# Patient Record
Sex: Male | Born: 1945
Health system: Southern US, Community
[De-identification: ages and names within clinical notes are randomized; demographics above are authoritative.]

## PROBLEM LIST (undated history)

## (undated) DIAGNOSIS — K219 Gastro-esophageal reflux disease without esophagitis: Secondary | ICD-10-CM

## (undated) DIAGNOSIS — F32A Depression, unspecified: Secondary | ICD-10-CM

## (undated) DIAGNOSIS — M8580 Other specified disorders of bone density and structure, unspecified site: Secondary | ICD-10-CM

## (undated) DIAGNOSIS — E785 Hyperlipidemia, unspecified: Secondary | ICD-10-CM

## (undated) DIAGNOSIS — F329 Major depressive disorder, single episode, unspecified: Secondary | ICD-10-CM

## (undated) HISTORY — DX: Other specified disorders of bone density and structure, unspecified site: M85.80

## (undated) HISTORY — PX: LUMBAR LAMINECTOMY: SHX95

## (undated) HISTORY — PX: ROTATOR CUFF REPAIR: SHX139

## (undated) HISTORY — PX: UMBILICAL HERNIA REPAIR: SHX196

## (undated) HISTORY — DX: Hyperlipidemia, unspecified: E78.5

---

## 1999-04-07 ENCOUNTER — Ambulatory Visit (HOSPITAL_COMMUNITY): Admission: RE | Admit: 1999-04-07 | Discharge: 1999-04-07 | Payer: Self-pay | Admitting: *Deleted

## 1999-04-07 ENCOUNTER — Encounter (INDEPENDENT_AMBULATORY_CARE_PROVIDER_SITE_OTHER): Payer: Self-pay | Admitting: Specialist

## 2004-01-12 ENCOUNTER — Ambulatory Visit (HOSPITAL_COMMUNITY): Admission: RE | Admit: 2004-01-12 | Discharge: 2004-01-12 | Payer: Self-pay | Admitting: *Deleted

## 2004-01-12 ENCOUNTER — Encounter (INDEPENDENT_AMBULATORY_CARE_PROVIDER_SITE_OTHER): Payer: Self-pay | Admitting: Specialist

## 2005-03-20 ENCOUNTER — Encounter: Admission: RE | Admit: 2005-03-20 | Discharge: 2005-03-20 | Payer: Self-pay

## 2005-03-26 ENCOUNTER — Observation Stay (HOSPITAL_COMMUNITY): Admission: RE | Admit: 2005-03-26 | Discharge: 2005-03-26 | Payer: Self-pay | Admitting: Neurosurgery

## 2007-03-18 ENCOUNTER — Encounter (INDEPENDENT_AMBULATORY_CARE_PROVIDER_SITE_OTHER): Payer: Self-pay | Admitting: *Deleted

## 2007-03-18 ENCOUNTER — Ambulatory Visit (HOSPITAL_COMMUNITY): Admission: RE | Admit: 2007-03-18 | Discharge: 2007-03-18 | Payer: Self-pay | Admitting: *Deleted

## 2007-08-14 HISTORY — PX: UMBILICAL HERNIA REPAIR: SHX196

## 2008-04-21 ENCOUNTER — Emergency Department (HOSPITAL_BASED_OUTPATIENT_CLINIC_OR_DEPARTMENT_OTHER): Admission: EM | Admit: 2008-04-21 | Discharge: 2008-04-22 | Payer: Self-pay | Admitting: Emergency Medicine

## 2008-05-27 ENCOUNTER — Encounter: Payer: Self-pay | Admitting: Emergency Medicine

## 2008-05-28 ENCOUNTER — Inpatient Hospital Stay (HOSPITAL_COMMUNITY): Admission: EM | Admit: 2008-05-28 | Discharge: 2008-06-04 | Payer: Self-pay | Admitting: Internal Medicine

## 2008-05-29 ENCOUNTER — Encounter (INDEPENDENT_AMBULATORY_CARE_PROVIDER_SITE_OTHER): Payer: Self-pay | Admitting: Surgery

## 2008-05-29 HISTORY — PX: SMALL INTESTINE SURGERY: SHX150

## 2008-05-29 HISTORY — PX: ABDOMINAL WALL MESH  REMOVAL: SHX1116

## 2008-06-05 ENCOUNTER — Inpatient Hospital Stay (HOSPITAL_COMMUNITY): Admission: EM | Admit: 2008-06-05 | Discharge: 2008-06-09 | Payer: Self-pay | Admitting: Emergency Medicine

## 2009-10-24 ENCOUNTER — Encounter: Admission: RE | Admit: 2009-10-24 | Discharge: 2009-10-24 | Payer: Self-pay | Admitting: Internal Medicine

## 2010-12-26 NOTE — Op Note (Signed)
NAMEAMORI, [name]             ACCOUNT NO.:  [id number]   MEDICAL RECORD NO.:  [id number]          PATIENT TYPE:  AMB   LOCATION:  ENDO                         FACILITY:  [facility]   PHYSICIAN:  Georgiana Spinner, M.D.    DATE OF BIRTH:  [birth year]   DATE OF PROCEDURE:  03/18/2007  DATE OF DISCHARGE:                               OPERATIVE REPORT   PROCEDURE:  Colonoscopy.   INDICATION:  Colon polyps.   ANESTHESIA:  Fentanyl 100 mcg, Versed 10 mg.   PROCEDURE:  With the patient mildly sedated in the left lateral  decubitus position, a rectal examination was performed which was  unremarkable to my exam.  Subsequently, the Pentax videoscopic  colonoscope was inserted in the rectum and passed under direct vision  with pressure applied to reach the cecum, identified by ileocecal valve  and appendiceal orifice, both of which were photographed.  In the cecum  was a small polyp which also was photographed and removed using hot  biopsy forceps technique setting of 20/150 blended current.  From this  point, the colonoscope was then slowly withdrawn taking circumferential  views of the colonic mucosa, stopping only in the rectum, which appeared  normal, and showed hemorrhoids on retroflexed view.  The endoscope was  straightened and withdrawn.  The patient's vital signs and pulse  oximetry remained stable.  The patient tolerated the procedure well  without apparent complications.   FINDINGS:  Cecal polyp and internal hemorrhoids.  Otherwise, an  unremarkable exam.   PLAN:  Await biopsy report.  The patient will call me for results and  follow up with me as an outpatient.           ______________________________  Georgiana Spinner, M.D.     GMO/MEDQ  D:  03/18/2007  T:  03/18/2007  Job:  [id number]

## 2010-12-26 NOTE — H&P (Signed)
NAMESAL, SPRATLEY             ACCOUNT NO.:  [id number]   MEDICAL RECORD NO.:  [id number]          PATIENT TYPE:  INP   LOCATION:  1538                         [facility]ACILITY:  [facility]   PHYSICIAN:  Anselm Pancoast. Weatherly, M.D.DATE OF BIRTH:  11-21-1945   DATE OF ADMISSION:  06/05/2008  DATE OF DISCHARGE:                              HISTORY & PHYSICAL   CHIEF COMPLAINT:  Nausea and vomiting.   HISTORY:  Norman Piacentini is a 65 year old Caucasian male.  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 and 3 weeks  later after being released to return to work was readmitted this time at  [employer] on Thursday p.m.  He became more distended and on Saturday a  few days later had an urgent exploratory laparotomy by Dr. Michaell Cowing and Dr.  Dwain Sarna with the finding of a obstruction, mesh kind of hearing to  the small bowel and a small bowel resection was needed.  Postoperatively think the patient had an NG tube which was removed.  I  think it was Tuesday which would have been about 4 days after surgery.  He had several small, dark bowel movements and was reported to have had  bowel sounds and then was discharged yesterday, that is Friday, which  was about 5 to 6 days after his surgery.  He went home thought that he  was hungry and had some requests, one was a peach milkshake,  particularly this morning, soft egg, et Karie Soda, but each time he was  nauseated and would throw up and his wife called me approximately 11  a.m. today.  I suggested that if he was really not able to tolerate  liquids then he was going to need to be seen in the emergency room and  possibly readmitted fearing that he was getting dehydrated.  He did come  to the emergency room and was seen by the ER physician and an acute  abdominal series was performed.  At this time it looks as if he has a  small-bowel obstruction and very dilated  stomach, multiple dilated loops  of small bowel and I really cannot see that there is any obvious gas in  the colon.  The patient states that he had a small bowel movement over  the last 24 hours and I then placed a nasogastric tube in the emergency  room and got 1500 mL of kind of very bilious NG drainage.  The patient  feeling somewhat improved.  The patient's laboratory studies are  somewhat concerning in that his white count is only 7100 with hematocrit  44 and his CMET BUN is 8, and glucose is 139.  Electrolytes are fine,  potassium was 5, but his bilirubin was 1.8 with an SGOT 120, SGPT 105.  The patient says he is really not having abdominal pain, he just feels  sick and feels nauseated.   EXAMINATION:  ABDOMEN:  He does have a distended abdomen.  His abdomen  is not that tender, and there is about a 4 inch  abdominal incisions at  the umbilicus that looked like the staples are kind of separated, but  there is no obvious wound infection.   I think from looking at the x-rays that he has probably got a persistent  on recurrent small-bowel obstruction and he needs NG suction and IV  fluids.  Repeat a CMET in the morning and also a flat and upright x-ray  of the abdomen in the morning.  Hopefully, the abnormal liver tests are  really related to his having a bowel obstruction and not that of a stone  in the bile duct et Karie Soda.  His gallbladder was well seen on the CT  done 1 week ago that showed no evidence of any stones.  No thickening of  the gallbladder, and no evidence of any dilated common bile duct.  The  patient's wife, of course, is upset that he is going to be readmitted.  I think there is no option, and we have a long discussion with her about  the plan, et Karie Soda.  The patient request a little bit of Ativan on a  p.r.n. basis and also we will use a  little bit of morphine if needed.  Pain is not an issue and hopefully  this is just basically a postoperative ileus that was  not resolved.  He  was fed too soon and will not need a repeat laparotomy.  I cannot see  any evidence of any free air and he certainly does not have any  peritoneal signs like an ongoing peritonitis.           ______________________________  Anselm Pancoast. Zachery Dakins, M.D.     WJW/MEDQ  D:  06/05/2008  T:  06/06/2008  Job:  [id number]

## 2010-12-26 NOTE — H&P (Signed)
[name], [name]             ACCOUNT NO.:  [id number]   MEDICAL RECORD NO.:  [id number]          PATIENT TYPE:  INP   LOCATION:  1510                         FACILITY:  [facility]   PHYSICIAN:  Eduard Clos, MDDATE OF BIRTH:  November 01, 1945   DATE OF ADMISSION:  05/28/2008  DATE OF DISCHARGE:                              HISTORY & PHYSICAL   CHIEF COMPLAINT:  Nausea, vomiting, abdominal pain.   HISTORY OF PRESENT ILLNESS:  A 65 year old male with a recent hernia  surgery three weeks ago presents to the ER with complaints of persistent  nausea, vomiting, and abdominal pain.  The patient has been having the  symptoms over the last 3-4 days.  He denies any blood in the vomitus.  The pain is more in the epigastric area above his recent surgical site.  The pain is a constant type of pain, dull, aching.  Patient has been  having continuous vomiting, almost 20 times a day.  He denies any  diarrhea.  The last time he moved his bowels was two days ago, which was  watery.  He denies any fevers or chills.  He denies any associated chest  pain, shortness of breath, weakness, loss of consciousness, headache, or  dysuria.   PAST MEDICAL HISTORY:  1. Depression.  2. Low back pain.   PAST SURGICAL HISTORY:  1. Low back surgery recently, three weeks ago.  2. Umbilical hernia surgery.   MEDICATIONS PRIOR TO ADMISSION:  Zoloft 50 mg daily.   ALLERGIES:  No known drug allergies.   FAMILY HISTORY:  Significant for patient's father having colon cancer.  Patient is having colonoscopies done regularly, the last one was 1-1/2  years ago, and has been advised about colon cancer screening.   SOCIAL HISTORY:  Denies smoking cigarettes, drinking alcohol, or using  illegal drugs.   REVIEW OF SYSTEMS:  As per the history of present illness, nothing else  significant.   PHYSICAL EXAMINATION:  Patient is examined at bedside.  Not in acute  distress.  VITAL SIGNS:  Blood pressure 110/70, pulse 70  per minute, temperature  98.3, respirations 18 per minute, O2 sat 94% on room air.  HEENT:  Anicteric.  No pallor.  CHEST:  Bilateral air entry present.  No rhonchi, no crepitation.  HEART:  S1 and S2 heard.  ABDOMEN:  Soft.  Nontender.  Bowel sounds are heard.  There is some  erythematous area around the periumbilical area.  No acute discharges.  CNS:  Awake, alert and oriented to time, place, and person.  Moves upper  extremities 5/5.  EXTREMITIES:  Peripheral pulses felt.  No edema.   LABS:  CBC:  WBC is 9.5, hemoglobin 16.1, hematocrit 45.6, platelets  283, neutrophils 58%. INR 1.  Complete metabolic panel:  Sodium 140,  potassium 3.8, chloride 99, carbon dioxide 28, glucose 106, BUN 16,  creatinine 1.1, total bilirubin 0.8, alkaline phosphatase 109, AST 30,  ALT 12, total protein 8.2, albumin 4.6, calcium 9.6, lipase 44.  CK-MB  3.3.  Troponin I less than 0.05.  Myoglobin 107.  UA is negative for  nitrites, leukocytes.  Has  ketones 15.  Glucose negative.  WBCs 0.   CT of the abdomen and pelvis shows partial small bowel obstruction at  the mid small bowel at the level of the ventral abdominal wall hernia,  likely related to a combination  and incarcerated .  No specific  evidence that  complicating ischemia.  Developing a small amount of  ascites, likely secondary to above-described small bowel obstruction.   ASSESSMENT:  1. Partial small bowel obstruction in the mid small bowel at the level      of the ventral abdominal wall hernia.  2. Dehydration.  3. Recent umbilical hernia surgery.  4. History of depression with no suicidal ideation.   PLAN:  Admit patient to medical floor.  Will place patient on IV fluids,  n.p.o.  Pain-relief medication.  Will consult surgery.  Place on empiric  antibiotics and further recommendation as patient's condition evolvES.      Eduard Clos, MD  Electronically Signed     ANK/MEDQ  D:  05/28/2008  T:  05/28/2008  Job:  [id number]

## 2010-12-26 NOTE — Op Note (Signed)
NAME:  [name], [name]             ACCOUNT NO.:  [id number]   MEDICAL RECORD NO.:  [id number]          PATIENT TYPE:  AMB   LOCATION:  ENDO                         FACILITY:  [facility]   PHYSICIAN:  George M. Orr, M.D.    DATE OF BIRTH:  08/22/1945   DATE OF PROCEDURE:  03/18/2007  DATE OF DISCHARGE:                               OPERATIVE REPORT   PROCEDURE:  Colonoscopy.   INDICATION:  Colon polyps.   ANESTHESIA:  Fentanyl 100 mcg, Versed 10 mg.   PROCEDURE:  With the patient mildly sedated in the left lateral  decubitus position, a rectal examination was performed which was  unremarkable to my exam.  Subsequently, the Pentax videoscopic  colonoscope was inserted in the rectum and passed under direct vision  with pressure applied to reach the cecum, identified by ileocecal valve  and appendiceal orifice, both of which were photographed.  In the cecum  was a small polyp which also was photographed and removed using hot  biopsy forceps technique setting of 20/150 blended current.  From this  point, the colonoscope was then slowly withdrawn taking circumferential  views of the colonic mucosa, stopping only in the rectum, which appeared  normal, and showed hemorrhoids on retroflexed view.  The endoscope was  straightened and withdrawn.  The patient's vital signs and pulse  oximetry remained stable.  The patient tolerated the procedure well  without apparent complications.   FINDINGS:  Cecal polyp and internal hemorrhoids.  Otherwise, an  unremarkable exam.   PLAN:  Await biopsy report.  The patient will call me for results and  follow up with me as an outpatient.           ______________________________  George M. Orr, M.D.     GMO/MEDQ  D:  03/18/2007  T:  03/18/2007  Job:  [id number] 

## 2010-12-26 NOTE — Op Note (Signed)
[name], [name]             ACCOUNT NO.:  [id number]   MEDICAL RECORD NO.:  [id number]          PATIENT TYPE:  INP   LOCATION:  1510                         [facility]ACILITY:  [facility]   PHYSICIAN:  Ardeth Sportsman, MD     DATE OF BIRTH:  [birth year]   DATE OF PROCEDURE:  DATE OF DISCHARGE:                               OPERATIVE REPORT   PRIMARY CARE PHYSICIAN:  Dr. Renne Crigler.   SURGICAL ASSISTANT:  Emelia Loron, MD.   PREOPERATIVE DIAGNOSIS:  Small-bowel obstruction status post resent  umbilical hernia repair.   POSTOPERATIVE DIAGNOSES:  1. Small bowel injury from stitch at umbilical hernia repair.  2. Resection secondary to small-bowel injury.   PROCEDURES:  1. Diagnostic laparoscopy.  2. Laparoscopic lysis of adhesions x30 minutes.  3. Exploratory laparotomy.  4. Small bowel resection (jejunum x10 cm).  5. Removal of infection mesh.  6. Primary closure with 1-PDS.   ANESTHESIA:  General anesthesia.   SPECIMENS:  Jejunum with inflamed segment.   DRAINS:  None.   ESTIMATED BLOOD LOSS:  Less than 50 mg.   COMPLICATIONS:  None.   INDICATION FOR PROCEDURE:  [name] is a pleasant, 65 year old  gentleman who was sent to me for periumbilical hernia.  I ended up doing  an open hernia repair with underlay repair using a dual-sided mesh.  Postoperatively he did rather well; however, he started having worsening  abdominal pain.  He came in bowel obstruction.  He was initially  admitted to the medical service and surgical consultation was  made.  I  was not immediately available, but [name] was able to see the  patient and help stabilize him.  However, given the fact he had  worsening pain and bowel obstruction within a month of his surgery,  concern  was prior abscess, exposed mesh or other problems, and  therefore recommendation was made for diagnostic laparoscopy, lysis of  adhesions and possible small bowel resection.  The risks, benefits and  alternatives were  discussed and it appeared [name] gave informed  consent and the patient agreed.   OPERATIVE FINDINGS:  There was a stitch that went through the mesh and  nicked a piece of small bowel.  There was no definite contamination of  succus, but there was inflammation around the area.  There were dense  adhesions around the area.  The transition point was into the loop of  jejunum that was particularly stuck to the region.   PROCEDURE IN DETAIL:  Informed consent was confirmed.  The patient  already had an NG tube in place.  He had sequential compression devices  during the entire case.  He was positioned supine and arms tucked.  His  abdomen was prepped and draped in sterile fashion.   [name] placed a 10-mm port in the left subcostal region using a  direct cutdown Hasson-type technique to good results.  Camera inspection  revealed no anterior abdominal injury but a dense inflammation of small  bowel loops and omentum to the periumbilical ventral hernia underlying  layer of mesh.  Under direct visualization  5-mm ports were placed  in  the left flank and left lower quadrant.  Later a final port was placed  superpubically in the right lower quadrant and right mid abdomen.   There was dense inflamed phlegmon, so we placed ports around  circumferentially to try and get the best angle possible.  Using cold  scissors sharply we were able to free off most loops of bowel.  However,  there was one loop of jejunum that was densely adherent to the mesh.  On  freeing off the area there was dense adherence to the rim of the mesh  itself.  In freeing off around the rim in the center we could see a blue  stitch that had nicked a piece of small bowel.  There was no definite  contamination, but there was inflammation around the region.   [name] and I agreed to convert to open for better visual  inspection.  Capnoperitonem was evacuated.  A periumbilical midline  incision was made.  We  dissected safely to get into the abdomen just  superior to the mesh.  The mesh was removed in its  entirety in somewhat  of a piecemeal fashion as well as all foreign body stitches.  The small  bowel was freed from its remaining attachments onto the abdominal wall  and brought into the wound.  Inspection revealed no definite hole  remaining, but there was an inflamed patch about 4 x 4 cm in size.  There was a sharp turn and it seemed consistent that this was the  location of the small-bowel obstruction.   With all the inflammation there was concern there may be a persistent  pinhole and then presence of inflammation, [name] & I agreed that  it was safe to leave this in place and therefore we performed a small  bowel resection using a GIA-75 stapler in a side-to-side fashion and a  TA-60 to close the staple defect.  The small short loop of jejunum and  mesentery was ligated using clamps and 2-0 Vicryl ties.  The mesenteric  defect was closed using 3-0 Vicryl figure-of-eight stitches with good  result.  Inspection revealed good viability and there was a nice opened  patent anastomosis.  We allowed the anastomosis to fall down to the left  lower quadrant away from the region.   Copious irrigation was done over a liter.  His omentum was rather stuck  in his left upper quadrant and I was not able to bring that down.  However, he did have a very fatty preperitoneal left median and  umbilical ligament that we were able to help bring as an underlayer  there.   The wound was closed using 1-loop PDS in a running fashion.  Meticulous  finger sweeps were made to make sure there was no bowl or any other  abnormalities caught.  The wound itself was irrigated with a liter of  saline to clear return.  The wound was loosely approximated with  staples, but left opening about every 2 cm and was packed with Kling  gauze.  Betadine was placed around the injury.   Diagnostic laparoscopy was  performed again.  The left upper quadrant 10-  mm  port site was reapproximated using a laparoscopic suture passer  under direct visualization to good result.  The laparoscopic port sites  were used and closed using staples.  Sterile dressing was applied.   The patient was extubated and sent to recovery room in stable condition.   I am about  to explain the operative findings to the patient's family and  will discuss at length.  [name] and I will continue to follow the  patient.      Ardeth Sportsman, MD  Electronically Signed     SCG/MEDQ  D:  05/29/2008  T:  05/29/2008  Job:  [id number]   cc:   Juanetta Gosling, MD  [address]  [place] [address]   Soyla Murphy. Renne Crigler, M.D.  Fax: [phone]

## 2010-12-29 NOTE — Op Note (Signed)
[name], [name]             ACCOUNT NO.:  [id number]   MEDICAL RECORD NO.:  [id number]          PATIENT TYPE:  AMB   LOCATION:  SDS                          FACILITY:  [facility]   PHYSICIAN:  Henry A. Pool, M.D.    DATE OF BIRTH:  [birth year]   DATE OF PROCEDURE:  03/26/2005  DATE OF DISCHARGE:                                 OPERATIVE REPORT   PREOPERATIVE DIAGNOSES:  Right L3-4 herniated nucleus pulposus with  radiculopathy.   POSTOPERATIVE DIAGNOSES:  Right L3-4 herniated nucleus pulposus with  radiculopathy.   OPERATION PERFORMED:  Right L3-4 laminotomy and microdiskectomy.   SURGEON:  Kathaleen Maser. Pool, M.D.   ASSISTANT:  Reinaldo Meeker, M.D.   ANESTHESIA:  General oral endotracheal.   INDICATIONS FOR PROCEDURE:  The patient is a 65 year old male with a history  of severe back and right lower extremity pain, consistent with a right-sided  L4 radiculopathy which has failed conservative management.  Work-up  demonstrates evidence of a large right-sided L3-4 disk herniation with  marked compression of the thecal sac and right-sided L4 nerve root.  The  patient has been counseled as to his options.  He decided to proceed with a  laminotomy and microdiskectomy in hopes of improving the symptoms.   DESCRIPTION OF PROCEDURE:  The patient was taken to the operating room and  placed on the table in the supine position.  After adequate level of  anesthesia was achieved, the patient was positioned prone onto a Wilson  frame and appropriately padded.  The patient's lumbar region was prepped and  draped sterilely.  A 10 blade was used to make a linear skin incision  overlying the L3-4 interspace.  This was carried down sharply in the  midline.  A subperiosteal dissection was then performed exposing the lamina  and facet joints of L3 and L4 on the right side.  Deep self-retaining  retractor was placed.  Intraoperative x-ray was taken, the level was  confirmed.  The laminotomy was  then performed using high speed drill and  Kerrison rongeurs to remove the inferior edge of the lamina at L3, medial  aspect of the L3-4 facet joint and the superior rim of the L4 lamina.  Ligamentum flavum was then elevated and resected in piecemeal fashion using  Kerrison rongeurs.  The underlying thecal sac and exiting L4 nerve root were  identified.   The microscope was brought into the field and used for microdissection of  the left-sided L4 nerve root and underlying disk herniation.  Epidural  venous plexus coagulated and cut.  Thecal sac and L4 nerve root were gently  mobilized medially.  A large amount of free disk herniation was encountered  and removed using pituitary rongeurs.  Three very large free fragments of  disk herniation were completely resected.  The thecal sac and nerve roots  were then mobilized and retracted towards the midline.  Disk space was then  isolated and then incised with a 15 blade in a rectangular fashion.  A wide  disk space cleanout was then achieved using pituitary rongeurs, upward  angled pituitary rongeurs and  Epstein curets.  All loose or obviously  degenerated disk material was removed from the interspace.  All  loose or  obviously degenerated disk material was removed from the interspace.  All  elements of disk herniation were completely resected.  At this point a very  thorough diskectomy had been performed.  There was no evidence of injury to  thecal sac or nerve roots.  There was no evidence of any residual  compression of the thecal sac or nerve roots.  The wound was then irrigated  with antibiotic solution.  Gelfoam was placed topically for hemostasis which  was found to be good.  Microscope and retractor system were removed.  Hemostasis in the muscle was achieved with electrocautery.  The wound was  then closed in layers with Vicryl sutures.  Steri-Strips and sterile  dressing were applied.  There were no apparent complications.  The  patient  tolerated the procedure well and returned to the recovery room  postoperatively.       HAP/MEDQ  D:  03/26/2005  T:  03/26/2005  Job:  [id number]

## 2010-12-29 NOTE — Discharge Summary (Signed)
[name], [name]             ACCOUNT NO.:  [id number]   MEDICAL RECORD NO.:  [id number]          PATIENT TYPE:  INP   LOCATION:  1538                         FACILITY:  [facility]   PHYSICIAN:  Ardeth Sportsman, MD     DATE OF BIRTH:  October 27, 1945   DATE OF ADMISSION:  06/05/2008  DATE OF DISCHARGE:  06/09/2008                               DISCHARGE SUMMARY   PRIMARY CARE PHYSICIAN:  Soyla Murphy. Renne Crigler, M.D.   SURGEON:  Ardeth Sportsman, MD.   FINAL DIAGNOSIS:  Partial small-bowel obstruction.   PROCEDURE:  1. Partial small bowel obstruction.  2. Umbilical hernia repair in early October of 2009.  3. History of depression.  4. Low back pain.  5. Low back surgery.   SUMMARY OF HOSPITAL COURSE:  [name] is a 65 year old obese  gentleman who had an umbilical hernia repair earlier in the month.  It  was complicated by bowel obstruction that required removal of the mesh  and small bowel resection with reanastomosis.  Postoperative his  obstruction gradually opened up and he was discharged home.  However, he  bounced back within 48 hours with some returning nausea and vomiting.  He was admitted by Anselm Pancoast. Zachery Dakins, M.D.  He had an NG tube placed  and IV fluids given aggressively.  His NG tube output decreased.  He  eventually had flatus.  His NG tube was clamped for a day and he still  had flatus and bowel movements and did well.  His NG tube was removed.  He was started on liquids and advanced to a solid diet over the next 48  hours.  By the time of discharge he was walking the hallways, having  flatus and bowel movements, his abdomen was no longer distended and  soft.  He was tolerating a solid and liquid diet relatively well.  His  incision had nearly closed down to just a few granulation areas since it  was partially opened secondary to the emergent surgery.   The patient has improved and he will be discharged home with the follow  instructions:  1. He is return to clinic to  see me next week for removal of his      staples and make sure he is doing well.  2. He should have a low-residue, low-fat diet as tolerated.  I      stressed that he should transition to liquids and bland foods if he      feels returning nausea, vomiting or other concerns.  Note that he      and his wife, that if he has any concerning abdominal pain, nausea,      vomiting or other she is to call us.  3. He should call if has any fevers, chills, sweats, worsening      drainage from incisions or other concerns.   He can return to his home medications that include:  1. Zoloft 50 mg p.o. daily.  2. I also wrote for some Tylenol p.r.n.  3. Ibuprofen p.r.n.  4. Heating pads p.r.n., ice packs p.r.n.  5. Narcotic 5-10 mg p.o.  q.4h. p.r.n. pain.      Ardeth Sportsman, MD  Electronically Signed     SCG/MEDQ  D:  06/10/2008  T:  06/10/2008  Job:  [id number]

## 2010-12-29 NOTE — Op Note (Signed)
NAME:  [name], [name]                       ACCOUNT NO.:  [id number]   MEDICAL RECORD NO.:  [id number]                   PATIENT TYPE:  AMB   LOCATION:  ENDO                                 FACILITY:  [facility]   PHYSICIAN:  Georgiana Spinner, M.D.                 DATE OF BIRTH:  May 21, 1946   DATE OF PROCEDURE:  01/12/2004  DATE OF DISCHARGE:                                 OPERATIVE REPORT   PROCEDURE:  Colonoscopy with biopsy.   INDICATIONS:  Colon polyps.   ANESTHESIA:  1. Demerol 80.  2. Versed 8 mg.   PROCEDURE:  With the patient mildly sedated in the left lateral decubitus  position, a rectal examination was performed which revealed external  hemorrhoids, otherwise unremarkable.  Subsequently, the Olympus videoscopic  colonoscope was inserted in the rectum and passed under direct vision to the  cecum, identified by the ileocecal valve and appendiceal orifice, both of  which were photographed.  From this point, the colonoscope was slowly  withdrawn, taking circumferential views of the colonic mucosa, stopping in  the ascending colon near the hepatic flexure, at which point a small polyp  was seen, photographed and removed using snare cautery technique, setting of  20 and 20 blended current.  Tissue was retrieved for pathology.  The  endoscope was then withdrawn all the way to the rectum, taking  circumferential views of remaining colonic mucosa, stopping in the rectum.  The endoscope was then placed in retroflex view to view the anal canal from  above.  Internal hemorrhoids were seen and photographed.  The endoscope was  straightened and withdrawn.  The patient's vital signs and pulse oximeter  remained stable.  Patient tolerated procedure well without apparent  complications.   FINDINGS:  1. Internal hemorrhoids.  2. Polyp of ascending colon near the hepatic flexure.   PLAN:  1. Await biopsy report.  2. Patient will call me for results and followup with me as an  outpatient.                                               Georgiana Spinner, M.D.    GMO/MEDQ  D:  01/12/2004  T:  01/12/2004  Job:  [id number]

## 2011-02-21 ENCOUNTER — Other Ambulatory Visit: Payer: Self-pay | Admitting: Dermatology

## 2011-02-21 DIAGNOSIS — C4491 Basal cell carcinoma of skin, unspecified: Secondary | ICD-10-CM

## 2011-02-21 HISTORY — DX: Basal cell carcinoma of skin, unspecified: C44.91

## 2011-03-20 ENCOUNTER — Other Ambulatory Visit: Payer: Self-pay | Admitting: Gastroenterology

## 2011-03-26 ENCOUNTER — Other Ambulatory Visit: Payer: Self-pay | Admitting: Dermatology

## 2011-05-15 LAB — POCT CARDIAC MARKERS
CKMB, poc: 3.3
CKMB, poc: 6
Myoglobin, poc: 107
Myoglobin, poc: 149
Troponin i, poc: <0.05
Troponin i, poc: <0.05

## 2011-05-15 LAB — DIFFERENTIAL
Basophils Absolute: 0.2 — ABNORMAL HIGH
Basophils Absolute: 0.1
Basophils Relative: 2 — ABNORMAL HIGH
Basophils Relative: 1
Eosinophils Absolute: 0.5
Eosinophils Absolute: 0.4
Eosinophils Relative: 5
Eosinophils Relative: 6 — ABNORMAL HIGH
Lymphocytes Relative: 28
Lymphocytes Relative: 27
Lymphs Abs: 2.6
Lymphs Abs: 1.9
Monocytes Absolute: 0.8
Monocytes Absolute: 0.7
Monocytes Relative: 8
Monocytes Relative: 9
Neutro Abs: 5.4
Neutro Abs: 4.1
Neutrophils Relative %: 58
Neutrophils Relative %: 57

## 2011-05-15 LAB — COMPREHENSIVE METABOLIC PANEL
ALT: 12
ALT: 105 — ABNORMAL HIGH
ALT: 18
ALT: 88 — ABNORMAL HIGH
AST: 30
AST: 120 — ABNORMAL HIGH
AST: 25
AST: 91 — ABNORMAL HIGH
Albumin: 4.6
Albumin: 2.9 — ABNORMAL LOW
Albumin: 3.5
Albumin: 3.6
Alkaline Phosphatase: 109
Alkaline Phosphatase: 102
Alkaline Phosphatase: 134 — ABNORMAL HIGH
Alkaline Phosphatase: 161 — ABNORMAL HIGH
BUN: 16
BUN: 11
BUN: 8
BUN: 8
CO2: 28
CO2: 24
CO2: 27
CO2: 29
Calcium: 9.6
Calcium: 8.7
Calcium: 8.8
Calcium: 9.6
Chloride: 99
Chloride: 105
Chloride: 107
Chloride: 98
Creatinine, Ser: 1.1
Creatinine, Ser: 1.05
Creatinine, Ser: 1.09
Creatinine, Ser: 1.14
GFR calc Af Amer: >60
GFR calc Af Amer: >60
GFR calc Af Amer: >60
GFR calc Af Amer: >60
GFR calc non Af Amer: >60
GFR calc non Af Amer: >60
GFR calc non Af Amer: >60
GFR calc non Af Amer: >60
Glucose, Bld: 106 — ABNORMAL HIGH
Glucose, Bld: 113 — ABNORMAL HIGH
Glucose, Bld: 118 — ABNORMAL HIGH
Glucose, Bld: 128 — ABNORMAL HIGH
Potassium: 3.8
Potassium: 3.3 — ABNORMAL LOW
Potassium: 3.7
Potassium: 5
Sodium: 140
Sodium: 133 — ABNORMAL LOW
Sodium: 139
Sodium: 141
Total Bilirubin: 0.8
Total Bilirubin: 0.7
Total Bilirubin: 0.8
Total Bilirubin: 1.8 — ABNORMAL HIGH
Total Protein: 8.2
Total Protein: 5.7 — ABNORMAL LOW
Total Protein: 6.8
Total Protein: 7.1

## 2011-05-15 LAB — GLUCOSE, CAPILLARY
Glucose-Capillary: 102 — ABNORMAL HIGH
Glucose-Capillary: 108 — ABNORMAL HIGH
Glucose-Capillary: 109 — ABNORMAL HIGH
Glucose-Capillary: 109 — ABNORMAL HIGH
Glucose-Capillary: 111 — ABNORMAL HIGH
Glucose-Capillary: 112 — ABNORMAL HIGH
Glucose-Capillary: 115 — ABNORMAL HIGH
Glucose-Capillary: 117 — ABNORMAL HIGH
Glucose-Capillary: 70
Glucose-Capillary: 70
Glucose-Capillary: 73
Glucose-Capillary: 78
Glucose-Capillary: 83
Glucose-Capillary: 92
Glucose-Capillary: 95
Glucose-Capillary: 97

## 2011-05-15 LAB — URINALYSIS, ROUTINE W REFLEX MICROSCOPIC
Glucose, UA: NEGATIVE
Glucose, UA: NEGATIVE
Hgb urine dipstick: NEGATIVE
Ketones, ur: 15 — AB
Ketones, ur: 15 — AB
Leukocytes, UA: NEGATIVE
Nitrite: NEGATIVE
Nitrite: NEGATIVE
Protein, ur: 30 — AB
Protein, ur: 100 — AB
Specific Gravity, Urine: 1.031 — ABNORMAL HIGH
Specific Gravity, Urine: 1.038 — ABNORMAL HIGH
Urobilinogen, UA: 1
Urobilinogen, UA: 1
pH: 6
pH: 6

## 2011-05-15 LAB — PROTIME-INR
INR: 1
INR: 1
Prothrombin Time: 13.3
Prothrombin Time: 13.3

## 2011-05-15 LAB — URINE MICROSCOPIC-ADD ON

## 2011-05-15 LAB — CBC
HCT: 45.6
HCT: 34.8 — ABNORMAL LOW
HCT: 35.6 — ABNORMAL LOW
HCT: 36.6 — ABNORMAL LOW
HCT: 39.7
HCT: 40.1
HCT: 44
HCT: 44.8
Hemoglobin: 16.1
Hemoglobin: 11.8 — ABNORMAL LOW
Hemoglobin: 12.2 — ABNORMAL LOW
Hemoglobin: 12.7 — ABNORMAL LOW
Hemoglobin: 13.7
Hemoglobin: 13.9
Hemoglobin: 14.9
Hemoglobin: 15.1
MCHC: 35.3
MCHC: 33.8
MCHC: 33.9
MCHC: 33.9
MCHC: 34.3
MCHC: 34.6
MCHC: 34.6
MCHC: 34.9
MCV: 93.8
MCV: 95.7
MCV: 96.2
MCV: 96.3
MCV: 96.6
MCV: 96.6
MCV: 97.2
MCV: 97.6
Platelets: 283
Platelets: 207
Platelets: 226
Platelets: 261
Platelets: 262
Platelets: 289
Platelets: 331
Platelets: 360
RBC: 4.86
RBC: 3.57 — ABNORMAL LOW
RBC: 3.7 — ABNORMAL LOW
RBC: 3.82 — ABNORMAL LOW
RBC: 4.11 — ABNORMAL LOW
RBC: 4.13 — ABNORMAL LOW
RBC: 4.58
RBC: 4.64
RDW: 11.9
RDW: 12.1
RDW: 12.5
RDW: 12.6
RDW: 12.7
RDW: 12.9
RDW: 13
RDW: 13
WBC: 9.5
WBC: 5
WBC: 5.3
WBC: 5.9
WBC: 6.2
WBC: 7.1
WBC: 7.2
WBC: 8.2

## 2011-05-15 LAB — HEPATIC FUNCTION PANEL
ALT: 79 — ABNORMAL HIGH
AST: 54 — ABNORMAL HIGH
Albumin: 3.2 — ABNORMAL LOW
Alkaline Phosphatase: 118 — ABNORMAL HIGH
Bilirubin, Direct: 0.1
Indirect Bilirubin: 0.5
Total Bilirubin: 0.6
Total Protein: 6.5

## 2011-05-15 LAB — CREATININE, SERUM
Creatinine, Ser: 0.96
Creatinine, Ser: 1.03
Creatinine, Ser: 1.11
GFR calc Af Amer: >60
GFR calc Af Amer: >60
GFR calc Af Amer: >60
GFR calc non Af Amer: >60
GFR calc non Af Amer: >60
GFR calc non Af Amer: >60

## 2011-05-15 LAB — POTASSIUM
Potassium: 3.6
Potassium: 3.8
Potassium: 4

## 2011-05-15 LAB — BASIC METABOLIC PANEL
BUN: 10
CO2: 27
Calcium: 8.6
Chloride: 101
Creatinine, Ser: 1
GFR calc Af Amer: >60
GFR calc non Af Amer: >60
Glucose, Bld: 85
Potassium: 3.6
Sodium: 135

## 2011-05-15 LAB — APTT
aPTT: 27
aPTT: 28

## 2011-05-15 LAB — LIPASE, BLOOD
Lipase: 44
Lipase: 77 — ABNORMAL HIGH

## 2011-05-16 LAB — DIFFERENTIAL
Basophils Absolute: 0.1
Basophils Relative: 1
Eosinophils Absolute: 0.4
Eosinophils Relative: 5
Lymphocytes Relative: 39
Lymphs Abs: 2.9
Monocytes Absolute: 0.7
Monocytes Relative: 9
Neutro Abs: 3.3
Neutrophils Relative %: 45

## 2011-05-16 LAB — COMPREHENSIVE METABOLIC PANEL
ALT: 24
AST: 36
Albumin: 4.7
Alkaline Phosphatase: 94
BUN: 14
CO2: 29
Calcium: 9.5
Chloride: 102
Creatinine, Ser: 1.1
GFR calc Af Amer: >60
GFR calc non Af Amer: >60
Glucose, Bld: 104 — ABNORMAL HIGH
Potassium: 4
Sodium: 140
Total Bilirubin: 0.5
Total Protein: 7.8

## 2011-05-16 LAB — URINALYSIS, ROUTINE W REFLEX MICROSCOPIC
Bilirubin Urine: NEGATIVE
Glucose, UA: NEGATIVE
Ketones, ur: NEGATIVE
Leukocytes, UA: NEGATIVE
Nitrite: NEGATIVE
Protein, ur: NEGATIVE
Specific Gravity, Urine: 1.024
Urobilinogen, UA: 1
pH: 6

## 2011-05-16 LAB — URINE MICROSCOPIC-ADD ON

## 2011-05-16 LAB — CBC
HCT: 44.2
Hemoglobin: 15.6
MCHC: 35.2
MCV: 94.2
Platelets: 195
RBC: 4.7
RDW: 12.2
WBC: 7.4

## 2011-05-16 LAB — LIPASE, BLOOD: Lipase: 44

## 2011-05-16 LAB — LACTIC ACID, PLASMA: Lactic Acid, Venous: 1

## 2011-09-04 DIAGNOSIS — M2550 Pain in unspecified joint: Secondary | ICD-10-CM | POA: Diagnosis not present

## 2011-09-04 DIAGNOSIS — M25569 Pain in unspecified knee: Secondary | ICD-10-CM | POA: Diagnosis not present

## 2011-09-06 DIAGNOSIS — M25569 Pain in unspecified knee: Secondary | ICD-10-CM | POA: Diagnosis not present

## 2011-09-06 DIAGNOSIS — M159 Polyosteoarthritis, unspecified: Secondary | ICD-10-CM | POA: Diagnosis not present

## 2012-01-07 ENCOUNTER — Emergency Department (HOSPITAL_BASED_OUTPATIENT_CLINIC_OR_DEPARTMENT_OTHER): Payer: Medicare Other

## 2012-01-07 ENCOUNTER — Encounter (HOSPITAL_BASED_OUTPATIENT_CLINIC_OR_DEPARTMENT_OTHER): Payer: Self-pay | Admitting: Internal Medicine

## 2012-01-07 ENCOUNTER — Emergency Department (HOSPITAL_BASED_OUTPATIENT_CLINIC_OR_DEPARTMENT_OTHER)
Admission: EM | Admit: 2012-01-07 | Discharge: 2012-01-07 | Disposition: A | Discharge status: Home / Self Care | Payer: Medicare Other | Attending: Emergency Medicine | Admitting: Emergency Medicine

## 2012-01-07 DIAGNOSIS — W010XXA Fall on same level from slipping, tripping and stumbling without subsequent striking against object, initial encounter: Secondary | ICD-10-CM | POA: Insufficient documentation

## 2012-01-07 DIAGNOSIS — M25519 Pain in unspecified shoulder: Secondary | ICD-10-CM | POA: Diagnosis not present

## 2012-01-07 DIAGNOSIS — S4980XA Other specified injuries of shoulder and upper arm, unspecified arm, initial encounter: Secondary | ICD-10-CM | POA: Insufficient documentation

## 2012-01-07 DIAGNOSIS — S46009A Unspecified injury of muscle(s) and tendon(s) of the rotator cuff of unspecified shoulder, initial encounter: Secondary | ICD-10-CM

## 2012-01-07 DIAGNOSIS — S43429A Sprain of unspecified rotator cuff capsule, initial encounter: Secondary | ICD-10-CM | POA: Diagnosis not present

## 2012-01-07 DIAGNOSIS — S46909A Unspecified injury of unspecified muscle, fascia and tendon at shoulder and upper arm level, unspecified arm, initial encounter: Secondary | ICD-10-CM | POA: Insufficient documentation

## 2012-01-07 DIAGNOSIS — M79609 Pain in unspecified limb: Secondary | ICD-10-CM | POA: Diagnosis not present

## 2012-01-07 HISTORY — DX: Major depressive disorder, single episode, unspecified: F32.9

## 2012-01-07 HISTORY — DX: Depression, unspecified: F32.A

## 2012-01-07 MED ORDER — HYDROCODONE-ACETAMINOPHEN 5-325 MG PO TABS
1.0000 | ORAL_TABLET | Freq: Once | ORAL | Status: AC
  Administered 2012-01-07: 1 via ORAL
  Filled 2012-01-07: qty 1

## 2012-01-07 MED ORDER — HYDROCODONE-ACETAMINOPHEN 5-325 MG PO TABS: 1.0000 | ORAL_TABLET | Freq: Four times a day (QID) | ORAL | Status: AC | PRN

## 2012-01-07 NOTE — Discharge Instructions (Signed)
Rotator Cuff Injury  The rotator cuff is the collective set of muscles and tendons that make up the stabilizing unit of your shoulder. This unit holds in the ball of the humerus (upper arm bone) in the socket of the scapula (shoulder blade). Injuries to this stabilizing unit most commonly come from sports or activities that cause the arm to be moved repeatedly over the head. Examples of this include throwing, weight lifting, swimming, racquet sports, or an injury such as falling on your arm. Chronic (longstanding) irritation of this unit can cause inflammation (soreness), bursitis, and eventual damage to the tendons to the point of rupture (tear). An acute (sudden) injury of the rotator cuff can result in a partial or complete tear. You may need surgery with complete tears. Small or partial rotator cuff tears may be treated conservatively with temporary immobilization, exercises and rest. Physical therapy may be needed.  HOME CARE INSTRUCTIONS    Apply ice to the injury for 15 to 20 minutes 3 to 4 times per day for the first 2 days. Put the ice in a plastic bag and place a towel between the bag of ice and your skin.   If you have a shoulder immobilizer (sling and straps), do not remove it for as long as directed by your caregiver or until you see a caregiver for a follow-up examination. If you need to remove it, move your arm as little as possible.   You may want to sleep on several pillows or in a recliner at night to lessen swelling and pain.   Only take over-the-counter or prescription medicines for pain, discomfort, or fever as directed by your caregiver.   Do simple hand squeezing exercises with a soft rubber ball to decrease hand swelling.  SEEK MEDICAL CARE IF:    Pain in your shoulder increases or new pain or numbness develops in your arm, hand, or fingers.   Your hand or fingers are colder than your other hand.  SEEK IMMEDIATE MEDICAL CARE IF:    Your arm, hand, or fingers are numb or  tingling.   Your arm, hand, or fingers are increasingly swollen and painful, or turn white or blue.  Document Released: 07/27/2000 Document Revised: 07/19/2011 Document Reviewed: 07/20/2008  Exit[employer] Patient Information 2012 [employer].

## 2012-01-07 NOTE — ED Provider Notes (Signed)
I saw and evaluated the patient, reviewed the resident's note and I agree with the findings and plan.   .Face to face Exam:  General:  Awake HEENT:  Atraumatic Resp:  Normal effort Abd:  Nondistended Neuro:No focal weakness Lymph: No adenopathy   Nelia Shi, MD 01/07/12 [phone]

## 2012-01-07 NOTE — ED Notes (Signed)
Dr. Radford Pax is seeing Pt.at present time.

## 2012-01-07 NOTE — ED Provider Notes (Signed)
History     CSN: [id number]  Arrival date & time 01/07/12  1435   First MD Initiated Contact with Patient 01/07/12 1455      Chief Complaint  Patient presents with  . Fall  . Shoulder Pain    (Consider location/radiation/quality/duration/timing/severity/associated sxs/prior treatment) HPI The patient is a 66 yo man, history of prior right rotator cuff tendinopathy, presenting with fall and right arm pain.  The patient was unloading heavy items from his truck 1.5 hours prior to presentation when he slipped on a pile of dirt, staggered a few steps, then fell, hitting his right upper arm on the bumper of his truck.  He notes no head injury or LOC, and no preceding LH, dizziness, or visual/auditory/gustatory prodrome.  Since the incident, the patient notes significant right upper arm pain, as well as pain and weakness with all right arm movements but particularly with right shoulder abduction, extension, and external rotation.  He notes no arm numbness.  The patient notes being diagnosed with rotator cuff tendonopathy 6 months ago by his PCP, but no prior rotator cuff tear requiring procedural intervention.  No history of bone fractures in the past.  Past Medical History  Diagnosis Date  . Depression     Past Surgical History  Procedure Date  . Umbilical hernia repair   . Lumbar laminectomy     No family history on file.  History  Substance Use Topics  . Smoking status: Not on file  . Smokeless tobacco: Not on file  . Alcohol Use: Not on file     Review of Systems General: no fevers, chills, changes in weight, changes in appetite Skin: no rash HEENT: no blurry vision, hearing changes, sore throat Pulm: no dyspnea, coughing, wheezing CV: no chest pain, palpitations, shortness of breath Abd: no abdominal pain, nausea/vomiting, diarrhea/constipation GU: no dysuria, hematuria, polyuria Ext: see HPI Neuro: no numbness, or tingling  Allergies  Review of patient's allergies  indicates no known allergies.  Home Medications   Current Outpatient Rx  Name Route Sig Dispense Refill  . ASPIRIN 81 MG PO TABS Oral Take 81 mg by mouth daily.    Marland Kitchen LORATADINE 10 MG PO TABS Oral Take 10 mg by mouth daily. Patient used this medication for his allergies.    . SERTRALINE HCL 50 MG PO TABS Oral Take 50 mg by mouth daily.      BP 140/94  Pulse 84  Temp(Src) 98.3 F (36.8 C) (Oral)  Resp 18  Ht 6' (1.829 m)  Wt 220 lb (99.791 kg)  BMI 29.84 kg/m2  SpO2 100%  Physical Exam General: alert, cooperative, appears uncomfortable HEENT: pupils equal round and reactive to light, vision grossly intact, oropharynx clear and non-erythematous  Neck: supple, no lymphadenopathy, JVD, or carotid bruits Lungs: clear to ascultation bilaterally, normal work of respiration, no wheezes, rales, ronchi Heart: regular rate and rhythm, no murmurs, gallops, or rubs Abdomen: soft, non-tender, non-distended, normal bowel sounds Msk: right glenohumeral joint, scapula, and humerus non-tender to palpation.  Maximal pain with right shoulder active abduction, extension, and external rotation.  Exam limited by pain.  Negative Neer's test for impingement, but positive Hawkins test for impingement, supraspinatus, infraspinatus and subscapularis tests.  Positive painful arc test and drop arm sign. Extremities: no cyanosis, clubbing, or edema Neurologic: alert & oriented X3, cranial nerves II-XII intact, strength in right arm significantly limited by pain, otherwise strength 5/5 throughout, sensation intact to light touch  ED Course  Procedures (including critical care  time)  Labs Reviewed - No data to display No results found.   No diagnosis found.    MDM   # Right arm pain - the patient presents with right arm pain, s/p mechanical fall, with +drop arm, painful arc, and infraspinatus signs.  Symptoms concerning for humeral fracture vs rotator cuff tendonopathy vs rotator cuff tear. -x-ray right  shoulder and humerus -if x-ray negative, consider MRI to rule out rotator cuff tear -hydrocodone for pain  Addendum 4:00 pm - x-ray showed no acute fracture, mild subluxation.  Physical exam consistent with rotator cuff tear.  Will place in sling, and refer to orthopedic surgery for outpatient care.  Linward Headland, MD 01/07/12 [phone]

## 2012-01-07 NOTE — ED Notes (Signed)
Pt amb to triage with quick steady gait in nad. Pt reports fall just pta, hitting his shoulder on the bumper of his car. Pt c/o right shoulder pain, with limited rom. Denies hitting head or loc. Denies any other injuires.

## 2012-01-08 DIAGNOSIS — S46819A Strain of other muscles, fascia and tendons at shoulder and upper arm level, unspecified arm, initial encounter: Secondary | ICD-10-CM | POA: Diagnosis not present

## 2012-01-08 DIAGNOSIS — S43429A Sprain of unspecified rotator cuff capsule, initial encounter: Secondary | ICD-10-CM | POA: Diagnosis not present

## 2012-01-08 DIAGNOSIS — S43499A Other sprain of unspecified shoulder joint, initial encounter: Secondary | ICD-10-CM | POA: Diagnosis not present

## 2012-01-15 DIAGNOSIS — S43499A Other sprain of unspecified shoulder joint, initial encounter: Secondary | ICD-10-CM | POA: Diagnosis not present

## 2012-01-15 DIAGNOSIS — S46819A Strain of other muscles, fascia and tendons at shoulder and upper arm level, unspecified arm, initial encounter: Secondary | ICD-10-CM | POA: Diagnosis not present

## 2012-01-21 DIAGNOSIS — S43429A Sprain of unspecified rotator cuff capsule, initial encounter: Secondary | ICD-10-CM | POA: Diagnosis not present

## 2012-02-19 DIAGNOSIS — M25819 Other specified joint disorders, unspecified shoulder: Secondary | ICD-10-CM | POA: Diagnosis not present

## 2012-02-19 DIAGNOSIS — M19019 Primary osteoarthritis, unspecified shoulder: Secondary | ICD-10-CM | POA: Diagnosis not present

## 2012-02-19 DIAGNOSIS — M719 Bursopathy, unspecified: Secondary | ICD-10-CM | POA: Diagnosis not present

## 2012-02-19 DIAGNOSIS — S46819A Strain of other muscles, fascia and tendons at shoulder and upper arm level, unspecified arm, initial encounter: Secondary | ICD-10-CM | POA: Diagnosis not present

## 2012-02-19 DIAGNOSIS — S43499A Other sprain of unspecified shoulder joint, initial encounter: Secondary | ICD-10-CM | POA: Diagnosis not present

## 2012-02-19 DIAGNOSIS — M24119 Other articular cartilage disorders, unspecified shoulder: Secondary | ICD-10-CM | POA: Diagnosis not present

## 2012-02-19 DIAGNOSIS — M67919 Unspecified disorder of synovium and tendon, unspecified shoulder: Secondary | ICD-10-CM | POA: Diagnosis not present

## 2012-02-25 DIAGNOSIS — S43499A Other sprain of unspecified shoulder joint, initial encounter: Secondary | ICD-10-CM | POA: Diagnosis not present

## 2012-02-25 DIAGNOSIS — S46819A Strain of other muscles, fascia and tendons at shoulder and upper arm level, unspecified arm, initial encounter: Secondary | ICD-10-CM | POA: Diagnosis not present

## 2012-02-28 DIAGNOSIS — S43499A Other sprain of unspecified shoulder joint, initial encounter: Secondary | ICD-10-CM | POA: Diagnosis not present

## 2012-03-03 DIAGNOSIS — S46819A Strain of other muscles, fascia and tendons at shoulder and upper arm level, unspecified arm, initial encounter: Secondary | ICD-10-CM | POA: Diagnosis not present

## 2012-03-03 DIAGNOSIS — S43499A Other sprain of unspecified shoulder joint, initial encounter: Secondary | ICD-10-CM | POA: Diagnosis not present

## 2012-03-06 DIAGNOSIS — S43499A Other sprain of unspecified shoulder joint, initial encounter: Secondary | ICD-10-CM | POA: Diagnosis not present

## 2012-03-11 DIAGNOSIS — S43499A Other sprain of unspecified shoulder joint, initial encounter: Secondary | ICD-10-CM | POA: Diagnosis not present

## 2012-03-14 DIAGNOSIS — S43499A Other sprain of unspecified shoulder joint, initial encounter: Secondary | ICD-10-CM | POA: Diagnosis not present

## 2012-03-14 DIAGNOSIS — S46819A Strain of other muscles, fascia and tendons at shoulder and upper arm level, unspecified arm, initial encounter: Secondary | ICD-10-CM | POA: Diagnosis not present

## 2012-03-18 DIAGNOSIS — S43499A Other sprain of unspecified shoulder joint, initial encounter: Secondary | ICD-10-CM | POA: Diagnosis not present

## 2012-03-18 DIAGNOSIS — S46819A Strain of other muscles, fascia and tendons at shoulder and upper arm level, unspecified arm, initial encounter: Secondary | ICD-10-CM | POA: Diagnosis not present

## 2012-03-21 DIAGNOSIS — S43499A Other sprain of unspecified shoulder joint, initial encounter: Secondary | ICD-10-CM | POA: Diagnosis not present

## 2012-03-21 DIAGNOSIS — S46819A Strain of other muscles, fascia and tendons at shoulder and upper arm level, unspecified arm, initial encounter: Secondary | ICD-10-CM | POA: Diagnosis not present

## 2012-03-26 DIAGNOSIS — S46819A Strain of other muscles, fascia and tendons at shoulder and upper arm level, unspecified arm, initial encounter: Secondary | ICD-10-CM | POA: Diagnosis not present

## 2012-03-26 DIAGNOSIS — S43499A Other sprain of unspecified shoulder joint, initial encounter: Secondary | ICD-10-CM | POA: Diagnosis not present

## 2012-04-01 DIAGNOSIS — S43499A Other sprain of unspecified shoulder joint, initial encounter: Secondary | ICD-10-CM | POA: Diagnosis not present

## 2012-04-01 DIAGNOSIS — S46819A Strain of other muscles, fascia and tendons at shoulder and upper arm level, unspecified arm, initial encounter: Secondary | ICD-10-CM | POA: Diagnosis not present

## 2012-04-04 DIAGNOSIS — S46819A Strain of other muscles, fascia and tendons at shoulder and upper arm level, unspecified arm, initial encounter: Secondary | ICD-10-CM | POA: Diagnosis not present

## 2012-04-04 DIAGNOSIS — S43499A Other sprain of unspecified shoulder joint, initial encounter: Secondary | ICD-10-CM | POA: Diagnosis not present

## 2012-04-08 DIAGNOSIS — Z9889 Other specified postprocedural states: Secondary | ICD-10-CM | POA: Diagnosis not present

## 2012-04-11 DIAGNOSIS — S43429A Sprain of unspecified rotator cuff capsule, initial encounter: Secondary | ICD-10-CM | POA: Diagnosis not present

## 2012-04-15 DIAGNOSIS — S43429A Sprain of unspecified rotator cuff capsule, initial encounter: Secondary | ICD-10-CM | POA: Diagnosis not present

## 2012-04-18 DIAGNOSIS — S43429A Sprain of unspecified rotator cuff capsule, initial encounter: Secondary | ICD-10-CM | POA: Diagnosis not present

## 2012-05-05 DIAGNOSIS — S43429A Sprain of unspecified rotator cuff capsule, initial encounter: Secondary | ICD-10-CM | POA: Diagnosis not present

## 2012-05-12 DIAGNOSIS — S43429A Sprain of unspecified rotator cuff capsule, initial encounter: Secondary | ICD-10-CM | POA: Diagnosis not present

## 2012-06-11 DIAGNOSIS — Z23 Encounter for immunization: Secondary | ICD-10-CM | POA: Diagnosis not present

## 2012-12-03 ENCOUNTER — Other Ambulatory Visit: Payer: Self-pay | Admitting: Dermatology

## 2012-12-03 DIAGNOSIS — L570 Actinic keratosis: Secondary | ICD-10-CM | POA: Diagnosis not present

## 2012-12-03 DIAGNOSIS — D485 Neoplasm of uncertain behavior of skin: Secondary | ICD-10-CM | POA: Diagnosis not present

## 2012-12-25 DIAGNOSIS — M25569 Pain in unspecified knee: Secondary | ICD-10-CM | POA: Diagnosis not present

## 2012-12-25 DIAGNOSIS — IMO0002 Reserved for concepts with insufficient information to code with codable children: Secondary | ICD-10-CM | POA: Diagnosis not present

## 2012-12-25 DIAGNOSIS — M1710 Unilateral primary osteoarthritis, unspecified knee: Secondary | ICD-10-CM | POA: Diagnosis not present

## 2013-01-26 DIAGNOSIS — R197 Diarrhea, unspecified: Secondary | ICD-10-CM | POA: Diagnosis not present

## 2013-01-26 DIAGNOSIS — R079 Chest pain, unspecified: Secondary | ICD-10-CM | POA: Diagnosis not present

## 2013-01-26 DIAGNOSIS — R141 Gas pain: Secondary | ICD-10-CM | POA: Diagnosis not present

## 2013-01-26 DIAGNOSIS — R071 Chest pain on breathing: Secondary | ICD-10-CM | POA: Diagnosis not present

## 2013-01-26 DIAGNOSIS — R748 Abnormal levels of other serum enzymes: Secondary | ICD-10-CM | POA: Diagnosis not present

## 2013-01-26 DIAGNOSIS — R142 Eructation: Secondary | ICD-10-CM | POA: Diagnosis not present

## 2013-01-26 DIAGNOSIS — R109 Unspecified abdominal pain: Secondary | ICD-10-CM | POA: Diagnosis not present

## 2013-01-28 DIAGNOSIS — M810 Age-related osteoporosis without current pathological fracture: Secondary | ICD-10-CM | POA: Diagnosis not present

## 2013-01-29 ENCOUNTER — Other Ambulatory Visit (HOSPITAL_COMMUNITY): Payer: Self-pay | Admitting: Internal Medicine

## 2013-01-29 DIAGNOSIS — M549 Dorsalgia, unspecified: Secondary | ICD-10-CM

## 2013-01-29 DIAGNOSIS — IMO0002 Reserved for concepts with insufficient information to code with codable children: Secondary | ICD-10-CM

## 2013-02-03 ENCOUNTER — Ambulatory Visit (HOSPITAL_COMMUNITY): Admission: RE | Admit: 2013-02-03 | Payer: PRIVATE HEALTH INSURANCE | Source: Ambulatory Visit

## 2013-02-04 ENCOUNTER — Ambulatory Visit (HOSPITAL_COMMUNITY)
Admission: RE | Admit: 2013-02-04 | Discharge: 2013-02-04 | Disposition: A | Discharge status: Home / Self Care | Payer: Medicare Other | Source: Ambulatory Visit | Attending: Internal Medicine | Admitting: Internal Medicine

## 2013-02-04 DIAGNOSIS — M546 Pain in thoracic spine: Secondary | ICD-10-CM | POA: Diagnosis not present

## 2013-02-04 DIAGNOSIS — IMO0002 Reserved for concepts with insufficient information to code with codable children: Secondary | ICD-10-CM

## 2013-02-04 DIAGNOSIS — M549 Dorsalgia, unspecified: Secondary | ICD-10-CM

## 2013-02-10 ENCOUNTER — Other Ambulatory Visit: Payer: Self-pay | Admitting: Internal Medicine

## 2013-02-10 DIAGNOSIS — S22009S Unspecified fracture of unspecified thoracic vertebra, sequela: Secondary | ICD-10-CM

## 2013-02-15 ENCOUNTER — Ambulatory Visit
Admission: RE | Admit: 2013-02-15 | Discharge: 2013-02-15 | Disposition: A | Discharge status: Home / Self Care | Payer: Medicare Other | Source: Ambulatory Visit | Attending: Internal Medicine | Admitting: Internal Medicine

## 2013-02-15 DIAGNOSIS — S22009S Unspecified fracture of unspecified thoracic vertebra, sequela: Secondary | ICD-10-CM

## 2013-02-15 DIAGNOSIS — M8448XA Pathological fracture, other site, initial encounter for fracture: Secondary | ICD-10-CM | POA: Diagnosis not present

## 2013-02-19 ENCOUNTER — Telehealth (HOSPITAL_COMMUNITY): Payer: Self-pay | Admitting: Interventional Radiology

## 2013-02-19 NOTE — Telephone Encounter (Signed)
Called pt - spoke to his wife told her the insurance had approved [id number]. Pt's wife states she will tell her husband and call me back tomorrow to let me know if her husband even wants the procedure done. JMichaux

## 2013-03-10 DIAGNOSIS — M899 Disorder of bone, unspecified: Secondary | ICD-10-CM | POA: Diagnosis not present

## 2013-03-10 DIAGNOSIS — S22009A Unspecified fracture of unspecified thoracic vertebra, initial encounter for closed fracture: Secondary | ICD-10-CM | POA: Diagnosis not present

## 2013-03-10 DIAGNOSIS — M949 Disorder of cartilage, unspecified: Secondary | ICD-10-CM | POA: Diagnosis not present

## 2013-03-11 ENCOUNTER — Emergency Department (HOSPITAL_BASED_OUTPATIENT_CLINIC_OR_DEPARTMENT_OTHER): Payer: Medicare Other

## 2013-03-11 ENCOUNTER — Encounter (HOSPITAL_BASED_OUTPATIENT_CLINIC_OR_DEPARTMENT_OTHER): Payer: Self-pay

## 2013-03-11 ENCOUNTER — Emergency Department (HOSPITAL_BASED_OUTPATIENT_CLINIC_OR_DEPARTMENT_OTHER)
Admission: EM | Admit: 2013-03-11 | Discharge: 2013-03-11 | Disposition: A | Discharge status: Home / Self Care | Payer: Medicare Other | Attending: Emergency Medicine | Admitting: Emergency Medicine

## 2013-03-11 DIAGNOSIS — S99929A Unspecified injury of unspecified foot, initial encounter: Secondary | ICD-10-CM | POA: Diagnosis not present

## 2013-03-11 DIAGNOSIS — S8990XA Unspecified injury of unspecified lower leg, initial encounter: Secondary | ICD-10-CM | POA: Diagnosis not present

## 2013-03-11 DIAGNOSIS — Z79899 Other long term (current) drug therapy: Secondary | ICD-10-CM | POA: Insufficient documentation

## 2013-03-11 DIAGNOSIS — Z7982 Long term (current) use of aspirin: Secondary | ICD-10-CM | POA: Diagnosis not present

## 2013-03-11 DIAGNOSIS — F3289 Other specified depressive episodes: Secondary | ICD-10-CM | POA: Insufficient documentation

## 2013-03-11 DIAGNOSIS — M25569 Pain in unspecified knee: Secondary | ICD-10-CM | POA: Diagnosis not present

## 2013-03-11 DIAGNOSIS — F329 Major depressive disorder, single episode, unspecified: Secondary | ICD-10-CM | POA: Insufficient documentation

## 2013-03-11 DIAGNOSIS — Z87891 Personal history of nicotine dependence: Secondary | ICD-10-CM | POA: Insufficient documentation

## 2013-03-11 DIAGNOSIS — Y9389 Activity, other specified: Secondary | ICD-10-CM | POA: Insufficient documentation

## 2013-03-11 DIAGNOSIS — S99919A Unspecified injury of unspecified ankle, initial encounter: Secondary | ICD-10-CM | POA: Diagnosis not present

## 2013-03-11 DIAGNOSIS — S8010XA Contusion of unspecified lower leg, initial encounter: Secondary | ICD-10-CM | POA: Insufficient documentation

## 2013-03-11 DIAGNOSIS — S8011XA Contusion of right lower leg, initial encounter: Secondary | ICD-10-CM

## 2013-03-11 DIAGNOSIS — Y9289 Other specified places as the place of occurrence of the external cause: Secondary | ICD-10-CM | POA: Insufficient documentation

## 2013-03-11 DIAGNOSIS — W208XXA Other cause of strike by thrown, projected or falling object, initial encounter: Secondary | ICD-10-CM | POA: Insufficient documentation

## 2013-03-11 NOTE — ED Notes (Signed)
States kicked tailgate approx 73opm-large hematoma to right lateral LE-slight abrasion noted

## 2013-03-11 NOTE — ED Provider Notes (Signed)
CSN: [id number]     Arrival date & time 03/11/13  2234 History     First MD Initiated Contact with Patient 03/11/13 2328     Chief Complaint  Patient presents with  . Leg Injury   (Consider location/radiation/quality/duration/timing/severity/associated sxs/prior Treatment) Patient is a 67 y.o. male presenting with leg pain. The history is provided by the patient.  Leg Pain Location:  Leg Time since incident:  3 hours Injury: yes   Mechanism of injury comment:  A tailgate fell on his leg  Leg location:  R lower leg Pain details:    Quality:  Aching and throbbing   Radiates to:  Does not radiate   Severity:  Mild   Onset quality:  Sudden   Duration:  3 hours   Timing:  Constant   Progression:  Unchanged Chronicity:  New Prior injury to area:  No Relieved by:  Ice and rest Exacerbated by: palpation. Ineffective treatments:  None tried Associated symptoms: swelling   Risk factors comment:  ASA the on anticoagulant   Past Medical History  Diagnosis Date  . Depression    Past Surgical History  Procedure Laterality Date  . Umbilical hernia repair    . Lumbar laminectomy     No family history on file. History  Substance Use Topics  . Smoking status: Former Games developer  . Smokeless tobacco: Not on file  . Alcohol Use: Yes    Review of Systems  All other systems reviewed and are negative.    Allergies  Review of patient's allergies indicates no known allergies.  Home Medications   Current Outpatient Rx  Name  Route  Sig  Dispense  Refill  . aspirin 81 MG tablet   Oral   Take 81 mg by mouth daily.         Marland Kitchen loratadine (CLARITIN) 10 MG tablet   Oral   Take 10 mg by mouth daily. Patient used this medication for his allergies.         Marland Kitchen sertraline (ZOLOFT) 50 MG tablet   Oral   Take 50 mg by mouth daily.          BP 109/66  Pulse 90  Temp(Src) 98.3 F (36.8 C) (Oral)  Resp 20  Ht 6' (1.829 m)  Wt 220 lb (99.791 kg)  BMI 29.83 kg/m2  SpO2  95% Physical Exam  Nursing note and vitals reviewed. Constitutional: He is oriented to person, place, and time. He appears well-developed and well-nourished. No distress.  HENT:  Head: Normocephalic and atraumatic.  Eyes: EOM are normal. Pupils are equal, round, and reactive to light.  Cardiovascular: Normal rate and intact distal pulses.   Pulmonary/Chest: Effort normal.  Abdominal: Soft.  Musculoskeletal:       Right lower leg: He exhibits tenderness and swelling.       Legs: Neurological: He is alert and oriented to person, place, and time.  Skin: Skin is warm and dry.  Psychiatric: He has a normal mood and affect. His behavior is normal.    ED Course   Procedures (including critical care time)  Labs Reviewed - No data to display Dg Tibia/fibula Right  03/11/2013   *RADIOLOGY REPORT*  Clinical Data: Right leg pain and swelling.  Trauma.  RIGHT TIBIA AND FIBULA - 2 VIEW  Comparison: None.  Findings: Visualized knee appears within normal limits.  There is no fracture.  Soft tissue stranding is present in the lateral subcutaneous fat midway down the leg.  Visualized ankle and  subtalar joints appear within normal limits. Tibia and fibula intact.  IMPRESSION: No osseous abnormality. Mild lateral soft tissue swelling in the subcutaneous fat.   Original Report Authenticated By: Andreas Newport, M.D.   1. Traumatic hematoma of lower leg, right, initial encounter     MDM   History of traumatic hematoma of his right lower leg after a tailgate on his leg. He is neurovascularly intact and only anticoagulant is aspirin. No complicating features of the hematoma. Patient is instructed to use ice elevation and compressive wrap.  Gwyneth Sprout, MD 03/11/13 2350

## 2013-03-30 DIAGNOSIS — Z79899 Other long term (current) drug therapy: Secondary | ICD-10-CM | POA: Diagnosis not present

## 2013-03-30 DIAGNOSIS — Z125 Encounter for screening for malignant neoplasm of prostate: Secondary | ICD-10-CM | POA: Diagnosis not present

## 2013-03-30 DIAGNOSIS — Z0000 Encounter for general adult medical examination without abnormal findings: Secondary | ICD-10-CM | POA: Diagnosis not present

## 2013-04-02 DIAGNOSIS — IMO0002 Reserved for concepts with insufficient information to code with codable children: Secondary | ICD-10-CM | POA: Diagnosis not present

## 2013-04-02 DIAGNOSIS — R748 Abnormal levels of other serum enzymes: Secondary | ICD-10-CM | POA: Diagnosis not present

## 2013-04-02 DIAGNOSIS — M25569 Pain in unspecified knee: Secondary | ICD-10-CM | POA: Diagnosis not present

## 2013-04-02 DIAGNOSIS — K625 Hemorrhage of anus and rectum: Secondary | ICD-10-CM | POA: Diagnosis not present

## 2013-04-02 DIAGNOSIS — M1710 Unilateral primary osteoarthritis, unspecified knee: Secondary | ICD-10-CM | POA: Diagnosis not present

## 2013-04-02 DIAGNOSIS — K219 Gastro-esophageal reflux disease without esophagitis: Secondary | ICD-10-CM | POA: Diagnosis not present

## 2013-04-02 DIAGNOSIS — Z006 Encounter for examination for normal comparison and control in clinical research program: Secondary | ICD-10-CM | POA: Diagnosis not present

## 2013-04-02 DIAGNOSIS — F411 Generalized anxiety disorder: Secondary | ICD-10-CM | POA: Diagnosis not present

## 2013-07-20 DIAGNOSIS — Z23 Encounter for immunization: Secondary | ICD-10-CM | POA: Diagnosis not present

## 2013-10-07 DIAGNOSIS — M899 Disorder of bone, unspecified: Secondary | ICD-10-CM | POA: Diagnosis not present

## 2013-10-07 DIAGNOSIS — F411 Generalized anxiety disorder: Secondary | ICD-10-CM | POA: Diagnosis not present

## 2013-10-07 DIAGNOSIS — K625 Hemorrhage of anus and rectum: Secondary | ICD-10-CM | POA: Diagnosis not present

## 2013-10-07 DIAGNOSIS — Z125 Encounter for screening for malignant neoplasm of prostate: Secondary | ICD-10-CM | POA: Diagnosis not present

## 2013-10-07 DIAGNOSIS — R748 Abnormal levels of other serum enzymes: Secondary | ICD-10-CM | POA: Diagnosis not present

## 2013-10-07 DIAGNOSIS — M949 Disorder of cartilage, unspecified: Secondary | ICD-10-CM | POA: Diagnosis not present

## 2013-10-07 DIAGNOSIS — N529 Male erectile dysfunction, unspecified: Secondary | ICD-10-CM | POA: Diagnosis not present

## 2013-10-16 DIAGNOSIS — Z86010 Personal history of colonic polyps: Secondary | ICD-10-CM | POA: Diagnosis not present

## 2013-10-16 DIAGNOSIS — K625 Hemorrhage of anus and rectum: Secondary | ICD-10-CM | POA: Diagnosis not present

## 2013-10-16 DIAGNOSIS — R748 Abnormal levels of other serum enzymes: Secondary | ICD-10-CM | POA: Diagnosis not present

## 2013-10-16 DIAGNOSIS — Z800 Family history of malignant neoplasm of digestive organs: Secondary | ICD-10-CM | POA: Diagnosis not present

## 2013-10-21 DIAGNOSIS — K921 Melena: Secondary | ICD-10-CM | POA: Diagnosis not present

## 2013-10-21 DIAGNOSIS — Z86010 Personal history of colonic polyps: Secondary | ICD-10-CM | POA: Diagnosis not present

## 2013-10-21 DIAGNOSIS — K648 Other hemorrhoids: Secondary | ICD-10-CM | POA: Diagnosis not present

## 2013-10-22 DIAGNOSIS — M949 Disorder of cartilage, unspecified: Secondary | ICD-10-CM | POA: Diagnosis not present

## 2013-10-22 DIAGNOSIS — F411 Generalized anxiety disorder: Secondary | ICD-10-CM | POA: Diagnosis not present

## 2013-10-22 DIAGNOSIS — M899 Disorder of bone, unspecified: Secondary | ICD-10-CM | POA: Diagnosis not present

## 2013-10-22 DIAGNOSIS — Z8781 Personal history of (healed) traumatic fracture: Secondary | ICD-10-CM | POA: Diagnosis not present

## 2014-04-12 DIAGNOSIS — M5137 Other intervertebral disc degeneration, lumbosacral region: Secondary | ICD-10-CM | POA: Diagnosis not present

## 2014-04-12 DIAGNOSIS — M999 Biomechanical lesion, unspecified: Secondary | ICD-10-CM | POA: Diagnosis not present

## 2014-04-13 DIAGNOSIS — M5137 Other intervertebral disc degeneration, lumbosacral region: Secondary | ICD-10-CM | POA: Diagnosis not present

## 2014-04-13 DIAGNOSIS — M999 Biomechanical lesion, unspecified: Secondary | ICD-10-CM | POA: Diagnosis not present

## 2014-04-14 DIAGNOSIS — M5137 Other intervertebral disc degeneration, lumbosacral region: Secondary | ICD-10-CM | POA: Diagnosis not present

## 2014-04-14 DIAGNOSIS — M999 Biomechanical lesion, unspecified: Secondary | ICD-10-CM | POA: Diagnosis not present

## 2014-04-15 DIAGNOSIS — M999 Biomechanical lesion, unspecified: Secondary | ICD-10-CM | POA: Diagnosis not present

## 2014-04-15 DIAGNOSIS — M5137 Other intervertebral disc degeneration, lumbosacral region: Secondary | ICD-10-CM | POA: Diagnosis not present

## 2014-04-22 DIAGNOSIS — M5137 Other intervertebral disc degeneration, lumbosacral region: Secondary | ICD-10-CM | POA: Diagnosis not present

## 2014-04-22 DIAGNOSIS — R748 Abnormal levels of other serum enzymes: Secondary | ICD-10-CM | POA: Diagnosis not present

## 2014-04-22 DIAGNOSIS — E785 Hyperlipidemia, unspecified: Secondary | ICD-10-CM | POA: Diagnosis not present

## 2014-04-22 DIAGNOSIS — M999 Biomechanical lesion, unspecified: Secondary | ICD-10-CM | POA: Diagnosis not present

## 2014-04-22 DIAGNOSIS — Z0000 Encounter for general adult medical examination without abnormal findings: Secondary | ICD-10-CM | POA: Diagnosis not present

## 2014-04-22 DIAGNOSIS — K219 Gastro-esophageal reflux disease without esophagitis: Secondary | ICD-10-CM | POA: Diagnosis not present

## 2014-04-22 DIAGNOSIS — Z23 Encounter for immunization: Secondary | ICD-10-CM | POA: Diagnosis not present

## 2014-04-26 DIAGNOSIS — M999 Biomechanical lesion, unspecified: Secondary | ICD-10-CM | POA: Diagnosis not present

## 2014-04-26 DIAGNOSIS — M5137 Other intervertebral disc degeneration, lumbosacral region: Secondary | ICD-10-CM | POA: Diagnosis not present

## 2014-04-27 DIAGNOSIS — M25569 Pain in unspecified knee: Secondary | ICD-10-CM | POA: Diagnosis not present

## 2014-04-27 DIAGNOSIS — M999 Biomechanical lesion, unspecified: Secondary | ICD-10-CM | POA: Diagnosis not present

## 2014-04-27 DIAGNOSIS — Z23 Encounter for immunization: Secondary | ICD-10-CM | POA: Diagnosis not present

## 2014-04-27 DIAGNOSIS — M5137 Other intervertebral disc degeneration, lumbosacral region: Secondary | ICD-10-CM | POA: Diagnosis not present

## 2014-04-27 DIAGNOSIS — M1710 Unilateral primary osteoarthritis, unspecified knee: Secondary | ICD-10-CM | POA: Diagnosis not present

## 2014-04-27 DIAGNOSIS — E785 Hyperlipidemia, unspecified: Secondary | ICD-10-CM | POA: Diagnosis not present

## 2014-05-03 DIAGNOSIS — M999 Biomechanical lesion, unspecified: Secondary | ICD-10-CM | POA: Diagnosis not present

## 2014-05-03 DIAGNOSIS — M5137 Other intervertebral disc degeneration, lumbosacral region: Secondary | ICD-10-CM | POA: Diagnosis not present

## 2014-05-05 DIAGNOSIS — M5137 Other intervertebral disc degeneration, lumbosacral region: Secondary | ICD-10-CM | POA: Diagnosis not present

## 2014-05-05 DIAGNOSIS — M999 Biomechanical lesion, unspecified: Secondary | ICD-10-CM | POA: Diagnosis not present

## 2014-05-10 DIAGNOSIS — M5137 Other intervertebral disc degeneration, lumbosacral region: Secondary | ICD-10-CM | POA: Diagnosis not present

## 2014-05-10 DIAGNOSIS — M999 Biomechanical lesion, unspecified: Secondary | ICD-10-CM | POA: Diagnosis not present

## 2014-05-17 DIAGNOSIS — M9904 Segmental and somatic dysfunction of sacral region: Secondary | ICD-10-CM | POA: Diagnosis not present

## 2014-05-17 DIAGNOSIS — M5137 Other intervertebral disc degeneration, lumbosacral region: Secondary | ICD-10-CM | POA: Diagnosis not present

## 2014-06-08 DIAGNOSIS — M5137 Other intervertebral disc degeneration, lumbosacral region: Secondary | ICD-10-CM | POA: Diagnosis not present

## 2014-06-08 DIAGNOSIS — M9904 Segmental and somatic dysfunction of sacral region: Secondary | ICD-10-CM | POA: Diagnosis not present

## 2014-06-09 DIAGNOSIS — M5137 Other intervertebral disc degeneration, lumbosacral region: Secondary | ICD-10-CM | POA: Diagnosis not present

## 2014-06-09 DIAGNOSIS — M9904 Segmental and somatic dysfunction of sacral region: Secondary | ICD-10-CM | POA: Diagnosis not present

## 2014-06-14 DIAGNOSIS — M9904 Segmental and somatic dysfunction of sacral region: Secondary | ICD-10-CM | POA: Diagnosis not present

## 2014-06-14 DIAGNOSIS — M5137 Other intervertebral disc degeneration, lumbosacral region: Secondary | ICD-10-CM | POA: Diagnosis not present

## 2014-06-22 DIAGNOSIS — M9904 Segmental and somatic dysfunction of sacral region: Secondary | ICD-10-CM | POA: Diagnosis not present

## 2014-06-22 DIAGNOSIS — M5137 Other intervertebral disc degeneration, lumbosacral region: Secondary | ICD-10-CM | POA: Diagnosis not present

## 2014-06-30 DIAGNOSIS — M5137 Other intervertebral disc degeneration, lumbosacral region: Secondary | ICD-10-CM | POA: Diagnosis not present

## 2014-06-30 DIAGNOSIS — M9904 Segmental and somatic dysfunction of sacral region: Secondary | ICD-10-CM | POA: Diagnosis not present

## 2014-07-12 DIAGNOSIS — J019 Acute sinusitis, unspecified: Secondary | ICD-10-CM | POA: Diagnosis not present

## 2014-07-19 DIAGNOSIS — M9904 Segmental and somatic dysfunction of sacral region: Secondary | ICD-10-CM | POA: Diagnosis not present

## 2014-07-19 DIAGNOSIS — M5137 Other intervertebral disc degeneration, lumbosacral region: Secondary | ICD-10-CM | POA: Diagnosis not present

## 2014-07-26 DIAGNOSIS — M9904 Segmental and somatic dysfunction of sacral region: Secondary | ICD-10-CM | POA: Diagnosis not present

## 2014-07-26 DIAGNOSIS — M5137 Other intervertebral disc degeneration, lumbosacral region: Secondary | ICD-10-CM | POA: Diagnosis not present

## 2014-08-03 DIAGNOSIS — M5137 Other intervertebral disc degeneration, lumbosacral region: Secondary | ICD-10-CM | POA: Diagnosis not present

## 2014-08-03 DIAGNOSIS — M9904 Segmental and somatic dysfunction of sacral region: Secondary | ICD-10-CM | POA: Diagnosis not present

## 2014-08-09 DIAGNOSIS — M5137 Other intervertebral disc degeneration, lumbosacral region: Secondary | ICD-10-CM | POA: Diagnosis not present

## 2014-08-09 DIAGNOSIS — M9904 Segmental and somatic dysfunction of sacral region: Secondary | ICD-10-CM | POA: Diagnosis not present

## 2014-08-25 DIAGNOSIS — M9904 Segmental and somatic dysfunction of sacral region: Secondary | ICD-10-CM | POA: Diagnosis not present

## 2014-08-25 DIAGNOSIS — M5137 Other intervertebral disc degeneration, lumbosacral region: Secondary | ICD-10-CM | POA: Diagnosis not present

## 2014-09-02 DIAGNOSIS — M9904 Segmental and somatic dysfunction of sacral region: Secondary | ICD-10-CM | POA: Diagnosis not present

## 2014-09-02 DIAGNOSIS — M5137 Other intervertebral disc degeneration, lumbosacral region: Secondary | ICD-10-CM | POA: Diagnosis not present

## 2014-09-30 DIAGNOSIS — M9904 Segmental and somatic dysfunction of sacral region: Secondary | ICD-10-CM | POA: Diagnosis not present

## 2014-09-30 DIAGNOSIS — M5137 Other intervertebral disc degeneration, lumbosacral region: Secondary | ICD-10-CM | POA: Diagnosis not present

## 2014-12-14 DIAGNOSIS — M9904 Segmental and somatic dysfunction of sacral region: Secondary | ICD-10-CM | POA: Diagnosis not present

## 2014-12-14 DIAGNOSIS — M5137 Other intervertebral disc degeneration, lumbosacral region: Secondary | ICD-10-CM | POA: Diagnosis not present

## 2015-02-24 DIAGNOSIS — M25561 Pain in right knee: Secondary | ICD-10-CM | POA: Diagnosis not present

## 2015-02-28 ENCOUNTER — Encounter: Payer: Self-pay | Admitting: Family Medicine

## 2015-02-28 ENCOUNTER — Ambulatory Visit (INDEPENDENT_AMBULATORY_CARE_PROVIDER_SITE_OTHER): Payer: Medicare Other | Admitting: Family Medicine

## 2015-02-28 VITALS — BP 115/73 | HR 85 | Ht 72.0 in | Wt 220.0 lb

## 2015-02-28 DIAGNOSIS — M25561 Pain in right knee: Secondary | ICD-10-CM

## 2015-02-28 MED ORDER — METHYLPREDNISOLONE ACETATE 40 MG/ML IJ SUSP
40.0000 mg | Freq: Once | INTRAMUSCULAR | Status: AC
  Administered 2015-02-28: 40 mg via INTRA_ARTICULAR

## 2015-02-28 NOTE — Patient Instructions (Signed)
Your knee pain is due either to arthritis or a degenerative meniscus tear. Both are treated similarly. These are the 4 classes of medicine you can take for this: Tylenol 500mg  1-2 tabs three times a day for pain. Aleve 1-2 tabs twice a day with food Glucosamine sulfate 750mg  twice a day is a supplement that may help. Capsaicin, aspercreme, or biofreeze topically up to four times a day may also help with pain. Cortisone injections are an option - you were given one of these today. If cortisone injections do not help, there are different types of shots that may help but they take longer to take effect. It's important that you continue to stay active. Straight leg raises, knee extensions 3 sets of 10 once a day (add ankle weight if these become too easy). Consider physical therapy to strengthen muscles around the joint that hurts to take pressure off of the joint itself. Shoe inserts with good arch support may be helpful. Heat or ice 15 minutes at a time 3-4 times a day as needed to help with pain. Follow up with me in 1 month.

## 2015-03-02 ENCOUNTER — Encounter: Payer: Self-pay | Admitting: Family Medicine

## 2015-03-02 DIAGNOSIS — M25561 Pain in right knee: Secondary | ICD-10-CM | POA: Insufficient documentation

## 2015-03-02 HISTORY — DX: Pain in right knee: M25.561

## 2015-03-02 NOTE — Assessment & Plan Note (Signed)
radiographs fairly benign though discussed it's possible he has more arthritis than is seen with standard radiographs.  Degenerative meniscus tear the other possibility though both treated similarly.  Discussed tylenol, nsaids, glucosamine, topical medications.  Injection given today.  Shown home exercises to do daily.  Heat/ice as needed.  F/u in 1 month.  After informed written consent, patient was seated on exam table. Right knee was prepped with alcohol swab and utilizing anteromedial approach, patient's right knee was injected intraarticularly with 3:1 marcaine: depomedrol. Patient tolerated the procedure well without immediate complications.

## 2015-03-02 NOTE — Progress Notes (Signed)
PCP and referred by: Horatio Pel, MD  Subjective:   HPI: Patient is a 69 y.o. male here for right knee pain.  Patient reports he's had about 3 weeks of right knee pain persistently (off and on past year though). Pain level 3/10 up to 5/10 at times. No known injury or trauma. 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 Radiographs show minimal DJD present mainly patellofemoral compartment. Worse when going down stairs - feels like knee will give out. No catching or locking. Can radiate down his leg from the knee.  Past Medical History  Diagnosis Date  . Depression   . Hyperlipidemia   . Osteopenia     Current Outpatient Prescriptions on File Prior to Visit  Medication Sig Dispense Refill  . aspirin 81 MG tablet Take 81 mg by mouth daily.    Marland Kitchen loratadine (CLARITIN) 10 MG tablet Take 10 mg by mouth daily. Patient used this medication for his allergies.    Marland Kitchen sertraline (ZOLOFT) 50 MG tablet Take 50 mg by mouth daily.     No current facility-administered medications on file prior to visit.    Past Surgical History  Procedure Laterality Date  . Umbilical hernia repair    . Lumbar laminectomy      No Known Allergies  History   Social History  . Marital Status: Married    Spouse Name: N/A  . Number of Children: N/A  . Years of Education: N/A   Occupational History  . Not on file.   Social History Main Topics  . Smoking status: Former Research scientist (life sciences)  . Smokeless tobacco: Not on file  . Alcohol Use: 0.0 oz/week    0 Standard drinks or equivalent per week  . Drug Use: Not on file  . Sexual Activity: Not on file   Other Topics Concern  . Not on file   Social History Narrative    No family history on file.  BP 115/73 mmHg  Pulse 85  Ht 6' (1.829 m)  Wt 220 lb (99.791 kg)  BMI 29.83 kg/m2  Review of Systems: See HPI above.    Objective:  Physical Exam:  Gen: NAD  Right knee: No gross deformity, ecchymoses,  effusion. TTP medial joint line.  No other tenderness. FROM. Negative ant/post drawers. Negative valgus/varus testing. Negative lachmanns. Negative mcmurrays, apleys, patellar apprehension. NV intact distally.    Assessment & Plan:  1. Right knee pain - radiographs fairly benign though discussed it's possible he has more arthritis than is seen with standard radiographs.  Degenerative meniscus tear the other possibility though both treated similarly.  Discussed tylenol, nsaids, glucosamine, topical medications.  Injection given today.  Shown home exercises to do daily.  Heat/ice as needed.  F/u in 1 month.  After informed written consent, patient was seated on exam table. Right knee was prepped with alcohol swab and utilizing anteromedial approach, patient's right knee was injected intraarticularly with 3:1 marcaine: depomedrol. Patient tolerated the procedure well without immediate complications.

## 2015-03-10 ENCOUNTER — Encounter: Payer: Self-pay | Admitting: Family Medicine

## 2015-03-30 DIAGNOSIS — M5137 Other intervertebral disc degeneration, lumbosacral region: Secondary | ICD-10-CM | POA: Diagnosis not present

## 2015-03-30 DIAGNOSIS — M9904 Segmental and somatic dysfunction of sacral region: Secondary | ICD-10-CM | POA: Diagnosis not present

## 2015-03-31 ENCOUNTER — Encounter: Payer: Self-pay | Admitting: Family Medicine

## 2015-03-31 ENCOUNTER — Encounter (INDEPENDENT_AMBULATORY_CARE_PROVIDER_SITE_OTHER): Payer: Self-pay

## 2015-03-31 ENCOUNTER — Ambulatory Visit (INDEPENDENT_AMBULATORY_CARE_PROVIDER_SITE_OTHER): Payer: Medicare Other | Admitting: Family Medicine

## 2015-03-31 VITALS — BP 129/84 | Ht 72.0 in | Wt 220.0 lb

## 2015-03-31 DIAGNOSIS — M25561 Pain in right knee: Secondary | ICD-10-CM

## 2015-04-04 NOTE — Progress Notes (Signed)
PCP and referred by: Horatio Pel, MD  Subjective:   HPI: Patient is a 69 y.o. male here for right knee pain.  7/18: Patient reports he's had about 3 weeks of right knee pain persistently (off and on past year though). Pain level 3/10 up to 5/10 at times. No known injury or trauma. 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 Radiographs show minimal DJD present mainly patellofemoral compartment. Worse when going down stairs - feels like knee will give out. No catching or locking. Can radiate down his leg from the knee.  8/18: Patient reports he is doing well. Some pain getting up and down on the truck but much better following injection.  Past Medical History  Diagnosis Date  . Depression   . Hyperlipidemia   . Osteopenia     Current Outpatient Prescriptions on File Prior to Visit  Medication Sig Dispense Refill  . aspirin 81 MG tablet Take 81 mg by mouth daily.    Marland Kitchen loratadine (CLARITIN) 10 MG tablet Take 10 mg by mouth daily. Patient used this medication for his allergies.    Marland Kitchen omeprazole (PRILOSEC) 20 MG capsule   0  . sertraline (ZOLOFT) 50 MG tablet Take 50 mg by mouth daily.     No current facility-administered medications on file prior to visit.    Past Surgical History  Procedure Laterality Date  . Umbilical hernia repair    . Lumbar laminectomy      No Known Allergies  Social History   Social History  . Marital Status: Married    Spouse Name: N/A  . Number of Children: N/A  . Years of Education: N/A   Occupational History  . Not on file.   Social History Main Topics  . Smoking status: Former Research scientist (life sciences)  . Smokeless tobacco: Not on file  . Alcohol Use: 0.0 oz/week    0 Standard drinks or equivalent per week  . Drug Use: Not on file  . Sexual Activity: Not on file   Other Topics Concern  . Not on file   Social History Narrative    No family history on file.  BP 129/84 mmHg  Ht 6' (1.829 m)  Wt 220 lb  (99.791 kg)  BMI 29.83 kg/m2  Review of Systems: See HPI above.    Objective:  Physical Exam:  Gen: NAD  Right knee: No gross deformity, ecchymoses, effusion. Minimal TTP medial joint line.  No other tenderness. FROM. Negative ant/post drawers. Negative valgus/varus testing. Negative lachmanns. Negative mcmurrays, apleys, patellar apprehension. NV intact distally.    Assessment & Plan:  1. Right knee pain - radiographs fairly benign though discussed it's possible he has more arthritis than is seen with standard radiographs.  Degenerative meniscus tear the other possibility though both treated similarly.  Improved with injection, home exercises.  Continue with HEP.  Tylenol, nsaids prn.  F/u prn.

## 2015-04-04 NOTE — Assessment & Plan Note (Signed)
radiographs fairly benign though discussed it's possible he has more arthritis than is seen with standard radiographs.  Degenerative meniscus tear the other possibility though both treated similarly.  Improved with injection, home exercises.  Continue with HEP.  Tylenol, nsaids prn.  F/u prn.

## 2015-05-26 DIAGNOSIS — Z0000 Encounter for general adult medical examination without abnormal findings: Secondary | ICD-10-CM | POA: Diagnosis not present

## 2015-05-26 DIAGNOSIS — Z125 Encounter for screening for malignant neoplasm of prostate: Secondary | ICD-10-CM | POA: Diagnosis not present

## 2015-05-26 DIAGNOSIS — Z7982 Long term (current) use of aspirin: Secondary | ICD-10-CM | POA: Diagnosis not present

## 2015-05-26 DIAGNOSIS — E785 Hyperlipidemia, unspecified: Secondary | ICD-10-CM | POA: Diagnosis not present

## 2015-05-26 DIAGNOSIS — Z23 Encounter for immunization: Secondary | ICD-10-CM | POA: Diagnosis not present

## 2015-05-31 DIAGNOSIS — L570 Actinic keratosis: Secondary | ICD-10-CM | POA: Diagnosis not present

## 2015-05-31 DIAGNOSIS — E785 Hyperlipidemia, unspecified: Secondary | ICD-10-CM | POA: Diagnosis not present

## 2015-05-31 DIAGNOSIS — N183 Chronic kidney disease, stage 3 (moderate): Secondary | ICD-10-CM | POA: Diagnosis not present

## 2015-05-31 DIAGNOSIS — Z1212 Encounter for screening for malignant neoplasm of rectum: Secondary | ICD-10-CM | POA: Diagnosis not present

## 2015-05-31 DIAGNOSIS — M8580 Other specified disorders of bone density and structure, unspecified site: Secondary | ICD-10-CM | POA: Diagnosis not present

## 2015-05-31 DIAGNOSIS — M8588 Other specified disorders of bone density and structure, other site: Secondary | ICD-10-CM | POA: Diagnosis not present

## 2015-05-31 DIAGNOSIS — G4762 Sleep related leg cramps: Secondary | ICD-10-CM | POA: Diagnosis not present

## 2015-06-09 DIAGNOSIS — L570 Actinic keratosis: Secondary | ICD-10-CM | POA: Diagnosis not present

## 2015-06-09 DIAGNOSIS — L821 Other seborrheic keratosis: Secondary | ICD-10-CM | POA: Diagnosis not present

## 2015-06-09 DIAGNOSIS — D239 Other benign neoplasm of skin, unspecified: Secondary | ICD-10-CM | POA: Diagnosis not present

## 2015-12-29 DIAGNOSIS — M9904 Segmental and somatic dysfunction of sacral region: Secondary | ICD-10-CM | POA: Diagnosis not present

## 2015-12-29 DIAGNOSIS — M5137 Other intervertebral disc degeneration, lumbosacral region: Secondary | ICD-10-CM | POA: Diagnosis not present

## 2016-01-02 DIAGNOSIS — M5137 Other intervertebral disc degeneration, lumbosacral region: Secondary | ICD-10-CM | POA: Diagnosis not present

## 2016-01-02 DIAGNOSIS — M9904 Segmental and somatic dysfunction of sacral region: Secondary | ICD-10-CM | POA: Diagnosis not present

## 2016-01-03 DIAGNOSIS — M5137 Other intervertebral disc degeneration, lumbosacral region: Secondary | ICD-10-CM | POA: Diagnosis not present

## 2016-01-03 DIAGNOSIS — M9904 Segmental and somatic dysfunction of sacral region: Secondary | ICD-10-CM | POA: Diagnosis not present

## 2016-01-04 DIAGNOSIS — M5137 Other intervertebral disc degeneration, lumbosacral region: Secondary | ICD-10-CM | POA: Diagnosis not present

## 2016-01-04 DIAGNOSIS — M9904 Segmental and somatic dysfunction of sacral region: Secondary | ICD-10-CM | POA: Diagnosis not present

## 2016-01-05 DIAGNOSIS — M9904 Segmental and somatic dysfunction of sacral region: Secondary | ICD-10-CM | POA: Diagnosis not present

## 2016-01-05 DIAGNOSIS — M5137 Other intervertebral disc degeneration, lumbosacral region: Secondary | ICD-10-CM | POA: Diagnosis not present

## 2016-01-10 DIAGNOSIS — M9904 Segmental and somatic dysfunction of sacral region: Secondary | ICD-10-CM | POA: Diagnosis not present

## 2016-01-10 DIAGNOSIS — M5137 Other intervertebral disc degeneration, lumbosacral region: Secondary | ICD-10-CM | POA: Diagnosis not present

## 2016-01-11 DIAGNOSIS — M9904 Segmental and somatic dysfunction of sacral region: Secondary | ICD-10-CM | POA: Diagnosis not present

## 2016-01-11 DIAGNOSIS — M5137 Other intervertebral disc degeneration, lumbosacral region: Secondary | ICD-10-CM | POA: Diagnosis not present

## 2016-01-12 DIAGNOSIS — M9904 Segmental and somatic dysfunction of sacral region: Secondary | ICD-10-CM | POA: Diagnosis not present

## 2016-01-12 DIAGNOSIS — M5137 Other intervertebral disc degeneration, lumbosacral region: Secondary | ICD-10-CM | POA: Diagnosis not present

## 2016-01-16 DIAGNOSIS — M5137 Other intervertebral disc degeneration, lumbosacral region: Secondary | ICD-10-CM | POA: Diagnosis not present

## 2016-01-16 DIAGNOSIS — M9904 Segmental and somatic dysfunction of sacral region: Secondary | ICD-10-CM | POA: Diagnosis not present

## 2016-01-17 DIAGNOSIS — M5137 Other intervertebral disc degeneration, lumbosacral region: Secondary | ICD-10-CM | POA: Diagnosis not present

## 2016-01-17 DIAGNOSIS — M9904 Segmental and somatic dysfunction of sacral region: Secondary | ICD-10-CM | POA: Diagnosis not present

## 2016-01-18 DIAGNOSIS — M5137 Other intervertebral disc degeneration, lumbosacral region: Secondary | ICD-10-CM | POA: Diagnosis not present

## 2016-01-18 DIAGNOSIS — M9904 Segmental and somatic dysfunction of sacral region: Secondary | ICD-10-CM | POA: Diagnosis not present

## 2016-01-19 DIAGNOSIS — M5137 Other intervertebral disc degeneration, lumbosacral region: Secondary | ICD-10-CM | POA: Diagnosis not present

## 2016-01-19 DIAGNOSIS — M9904 Segmental and somatic dysfunction of sacral region: Secondary | ICD-10-CM | POA: Diagnosis not present

## 2016-01-23 DIAGNOSIS — M5137 Other intervertebral disc degeneration, lumbosacral region: Secondary | ICD-10-CM | POA: Diagnosis not present

## 2016-01-23 DIAGNOSIS — M9904 Segmental and somatic dysfunction of sacral region: Secondary | ICD-10-CM | POA: Diagnosis not present

## 2016-01-25 DIAGNOSIS — M9904 Segmental and somatic dysfunction of sacral region: Secondary | ICD-10-CM | POA: Diagnosis not present

## 2016-01-25 DIAGNOSIS — M5137 Other intervertebral disc degeneration, lumbosacral region: Secondary | ICD-10-CM | POA: Diagnosis not present

## 2016-05-08 DIAGNOSIS — Z23 Encounter for immunization: Secondary | ICD-10-CM | POA: Diagnosis not present

## 2016-06-04 DIAGNOSIS — Z125 Encounter for screening for malignant neoplasm of prostate: Secondary | ICD-10-CM | POA: Diagnosis not present

## 2016-06-04 DIAGNOSIS — Z23 Encounter for immunization: Secondary | ICD-10-CM | POA: Diagnosis not present

## 2016-06-04 DIAGNOSIS — E785 Hyperlipidemia, unspecified: Secondary | ICD-10-CM | POA: Diagnosis not present

## 2016-06-04 DIAGNOSIS — E559 Vitamin D deficiency, unspecified: Secondary | ICD-10-CM | POA: Diagnosis not present

## 2016-06-04 DIAGNOSIS — Z0000 Encounter for general adult medical examination without abnormal findings: Secondary | ICD-10-CM | POA: Diagnosis not present

## 2016-06-05 ENCOUNTER — Other Ambulatory Visit: Payer: Self-pay | Admitting: Dermatology

## 2016-06-05 DIAGNOSIS — L739 Follicular disorder, unspecified: Secondary | ICD-10-CM | POA: Diagnosis not present

## 2016-06-05 DIAGNOSIS — D239 Other benign neoplasm of skin, unspecified: Secondary | ICD-10-CM | POA: Diagnosis not present

## 2016-06-05 DIAGNOSIS — D692 Other nonthrombocytopenic purpura: Secondary | ICD-10-CM | POA: Diagnosis not present

## 2016-06-05 DIAGNOSIS — D485 Neoplasm of uncertain behavior of skin: Secondary | ICD-10-CM | POA: Diagnosis not present

## 2016-06-07 DIAGNOSIS — M179 Osteoarthritis of knee, unspecified: Secondary | ICD-10-CM | POA: Diagnosis not present

## 2016-06-07 DIAGNOSIS — R42 Dizziness and giddiness: Secondary | ICD-10-CM | POA: Diagnosis not present

## 2016-06-07 DIAGNOSIS — J309 Allergic rhinitis, unspecified: Secondary | ICD-10-CM | POA: Diagnosis not present

## 2016-06-07 DIAGNOSIS — Z800 Family history of malignant neoplasm of digestive organs: Secondary | ICD-10-CM | POA: Diagnosis not present

## 2016-10-16 DIAGNOSIS — R05 Cough: Secondary | ICD-10-CM | POA: Diagnosis not present

## 2016-10-16 DIAGNOSIS — J4521 Mild intermittent asthma with (acute) exacerbation: Secondary | ICD-10-CM | POA: Diagnosis not present

## 2016-12-06 DIAGNOSIS — K219 Gastro-esophageal reflux disease without esophagitis: Secondary | ICD-10-CM | POA: Diagnosis not present

## 2016-12-06 DIAGNOSIS — F411 Generalized anxiety disorder: Secondary | ICD-10-CM | POA: Diagnosis not present

## 2016-12-06 DIAGNOSIS — Z6830 Body mass index (BMI) 30.0-30.9, adult: Secondary | ICD-10-CM | POA: Diagnosis not present

## 2017-02-14 DIAGNOSIS — H524 Presbyopia: Secondary | ICD-10-CM | POA: Diagnosis not present

## 2017-02-14 DIAGNOSIS — H25813 Combined forms of age-related cataract, bilateral: Secondary | ICD-10-CM | POA: Diagnosis not present

## 2017-06-17 DIAGNOSIS — E785 Hyperlipidemia, unspecified: Secondary | ICD-10-CM | POA: Diagnosis not present

## 2017-06-17 DIAGNOSIS — Z125 Encounter for screening for malignant neoplasm of prostate: Secondary | ICD-10-CM | POA: Diagnosis not present

## 2017-06-18 DIAGNOSIS — Z23 Encounter for immunization: Secondary | ICD-10-CM | POA: Diagnosis not present

## 2017-06-18 DIAGNOSIS — Z0000 Encounter for general adult medical examination without abnormal findings: Secondary | ICD-10-CM | POA: Diagnosis not present

## 2017-08-09 DIAGNOSIS — M179 Osteoarthritis of knee, unspecified: Secondary | ICD-10-CM | POA: Diagnosis not present

## 2017-08-09 DIAGNOSIS — E785 Hyperlipidemia, unspecified: Secondary | ICD-10-CM | POA: Diagnosis not present

## 2017-08-09 DIAGNOSIS — K219 Gastro-esophageal reflux disease without esophagitis: Secondary | ICD-10-CM | POA: Diagnosis not present

## 2017-08-09 DIAGNOSIS — L578 Other skin changes due to chronic exposure to nonionizing radiation: Secondary | ICD-10-CM | POA: Diagnosis not present

## 2017-08-09 DIAGNOSIS — F411 Generalized anxiety disorder: Secondary | ICD-10-CM | POA: Diagnosis not present

## 2017-08-09 DIAGNOSIS — Z1212 Encounter for screening for malignant neoplasm of rectum: Secondary | ICD-10-CM | POA: Diagnosis not present

## 2017-08-09 DIAGNOSIS — Z6830 Body mass index (BMI) 30.0-30.9, adult: Secondary | ICD-10-CM | POA: Diagnosis not present

## 2017-08-09 DIAGNOSIS — K439 Ventral hernia without obstruction or gangrene: Secondary | ICD-10-CM | POA: Diagnosis not present

## 2017-08-09 DIAGNOSIS — Z7982 Long term (current) use of aspirin: Secondary | ICD-10-CM | POA: Diagnosis not present

## 2017-08-09 DIAGNOSIS — J309 Allergic rhinitis, unspecified: Secondary | ICD-10-CM | POA: Diagnosis not present

## 2017-08-09 DIAGNOSIS — R252 Cramp and spasm: Secondary | ICD-10-CM | POA: Diagnosis not present

## 2017-08-09 DIAGNOSIS — N529 Male erectile dysfunction, unspecified: Secondary | ICD-10-CM | POA: Diagnosis not present

## 2017-08-09 DIAGNOSIS — G4762 Sleep related leg cramps: Secondary | ICD-10-CM | POA: Diagnosis not present

## 2017-08-15 DIAGNOSIS — E785 Hyperlipidemia, unspecified: Secondary | ICD-10-CM | POA: Diagnosis not present

## 2017-08-15 DIAGNOSIS — Z125 Encounter for screening for malignant neoplasm of prostate: Secondary | ICD-10-CM | POA: Diagnosis not present

## 2018-01-07 DIAGNOSIS — E785 Hyperlipidemia, unspecified: Secondary | ICD-10-CM | POA: Diagnosis not present

## 2018-01-07 DIAGNOSIS — M8580 Other specified disorders of bone density and structure, unspecified site: Secondary | ICD-10-CM | POA: Diagnosis not present

## 2018-01-07 DIAGNOSIS — N509 Disorder of male genital organs, unspecified: Secondary | ICD-10-CM | POA: Diagnosis not present

## 2018-01-07 DIAGNOSIS — K219 Gastro-esophageal reflux disease without esophagitis: Secondary | ICD-10-CM | POA: Diagnosis not present

## 2018-01-16 DIAGNOSIS — N503 Cyst of epididymis: Secondary | ICD-10-CM | POA: Diagnosis not present

## 2018-01-16 DIAGNOSIS — N509 Disorder of male genital organs, unspecified: Secondary | ICD-10-CM | POA: Diagnosis not present

## 2018-06-30 DIAGNOSIS — Z23 Encounter for immunization: Secondary | ICD-10-CM | POA: Diagnosis not present

## 2018-07-23 DIAGNOSIS — H524 Presbyopia: Secondary | ICD-10-CM | POA: Diagnosis not present

## 2018-07-23 DIAGNOSIS — H2513 Age-related nuclear cataract, bilateral: Secondary | ICD-10-CM | POA: Diagnosis not present

## 2018-07-23 DIAGNOSIS — H5203 Hypermetropia, bilateral: Secondary | ICD-10-CM | POA: Diagnosis not present

## 2018-07-23 DIAGNOSIS — Z135 Encounter for screening for eye and ear disorders: Secondary | ICD-10-CM | POA: Diagnosis not present

## 2018-07-23 DIAGNOSIS — H52223 Regular astigmatism, bilateral: Secondary | ICD-10-CM | POA: Diagnosis not present

## 2018-08-14 DIAGNOSIS — Z7289 Other problems related to lifestyle: Secondary | ICD-10-CM | POA: Diagnosis not present

## 2018-08-14 DIAGNOSIS — Z125 Encounter for screening for malignant neoplasm of prostate: Secondary | ICD-10-CM | POA: Diagnosis not present

## 2018-08-14 DIAGNOSIS — E785 Hyperlipidemia, unspecified: Secondary | ICD-10-CM | POA: Diagnosis not present

## 2018-08-14 DIAGNOSIS — Z1159 Encounter for screening for other viral diseases: Secondary | ICD-10-CM | POA: Diagnosis not present

## 2018-08-18 DIAGNOSIS — K219 Gastro-esophageal reflux disease without esophagitis: Secondary | ICD-10-CM | POA: Diagnosis not present

## 2018-08-18 DIAGNOSIS — Z6830 Body mass index (BMI) 30.0-30.9, adult: Secondary | ICD-10-CM | POA: Diagnosis not present

## 2018-08-18 DIAGNOSIS — Z0000 Encounter for general adult medical examination without abnormal findings: Secondary | ICD-10-CM | POA: Diagnosis not present

## 2018-08-18 DIAGNOSIS — M859 Disorder of bone density and structure, unspecified: Secondary | ICD-10-CM | POA: Diagnosis not present

## 2018-08-18 DIAGNOSIS — Z7982 Long term (current) use of aspirin: Secondary | ICD-10-CM | POA: Diagnosis not present

## 2018-08-18 DIAGNOSIS — Z1212 Encounter for screening for malignant neoplasm of rectum: Secondary | ICD-10-CM | POA: Diagnosis not present

## 2018-08-18 DIAGNOSIS — E785 Hyperlipidemia, unspecified: Secondary | ICD-10-CM | POA: Diagnosis not present

## 2018-08-18 DIAGNOSIS — J309 Allergic rhinitis, unspecified: Secondary | ICD-10-CM | POA: Diagnosis not present

## 2018-08-18 DIAGNOSIS — N503 Cyst of epididymis: Secondary | ICD-10-CM | POA: Diagnosis not present

## 2018-08-18 DIAGNOSIS — M8589 Other specified disorders of bone density and structure, multiple sites: Secondary | ICD-10-CM | POA: Diagnosis not present

## 2018-08-18 DIAGNOSIS — M8580 Other specified disorders of bone density and structure, unspecified site: Secondary | ICD-10-CM | POA: Diagnosis not present

## 2018-08-18 DIAGNOSIS — F411 Generalized anxiety disorder: Secondary | ICD-10-CM | POA: Diagnosis not present

## 2018-10-14 DIAGNOSIS — J45901 Unspecified asthma with (acute) exacerbation: Secondary | ICD-10-CM | POA: Diagnosis not present

## 2018-11-08 ENCOUNTER — Emergency Department (HOSPITAL_BASED_OUTPATIENT_CLINIC_OR_DEPARTMENT_OTHER)
Admission: EM | Admit: 2018-11-08 | Discharge: 2018-11-08 | Disposition: A | Discharge status: Home / Self Care | Payer: Medicare Other | Attending: Emergency Medicine | Admitting: Emergency Medicine

## 2018-11-08 ENCOUNTER — Other Ambulatory Visit: Payer: Self-pay

## 2018-11-08 ENCOUNTER — Emergency Department (HOSPITAL_BASED_OUTPATIENT_CLINIC_OR_DEPARTMENT_OTHER): Payer: Medicare Other

## 2018-11-08 ENCOUNTER — Encounter (HOSPITAL_BASED_OUTPATIENT_CLINIC_OR_DEPARTMENT_OTHER): Payer: Self-pay | Admitting: *Deleted

## 2018-11-08 DIAGNOSIS — N501 Vascular disorders of male genital organs: Secondary | ICD-10-CM | POA: Insufficient documentation

## 2018-11-08 DIAGNOSIS — Z87891 Personal history of nicotine dependence: Secondary | ICD-10-CM | POA: Diagnosis not present

## 2018-11-08 DIAGNOSIS — N5089 Other specified disorders of the male genital organs: Secondary | ICD-10-CM | POA: Diagnosis not present

## 2018-11-08 DIAGNOSIS — N4400 Torsion of testis, unspecified: Secondary | ICD-10-CM | POA: Diagnosis not present

## 2018-11-08 DIAGNOSIS — N433 Hydrocele, unspecified: Secondary | ICD-10-CM | POA: Diagnosis not present

## 2018-11-08 DIAGNOSIS — N50812 Left testicular pain: Secondary | ICD-10-CM | POA: Diagnosis present

## 2018-11-08 LAB — URINALYSIS, ROUTINE W REFLEX MICROSCOPIC
Bilirubin Urine: NEGATIVE
Glucose, UA: NEGATIVE mg/dL
Ketones, ur: NEGATIVE mg/dL
Leukocytes,Ua: NEGATIVE
Nitrite: NEGATIVE
Protein, ur: NEGATIVE mg/dL
Specific Gravity, Urine: <1.005 — ABNORMAL LOW (ref 1.005–1.030)
pH: 6.5 (ref 5.0–8.0)

## 2018-11-08 LAB — URINALYSIS, MICROSCOPIC (REFLEX)

## 2018-11-08 MED ORDER — OXYCODONE-ACETAMINOPHEN 5-325 MG PO TABS
1.0000 | ORAL_TABLET | Freq: Once | ORAL | Status: DC
Start: 2018-11-08 — End: 2018-11-09

## 2018-11-08 MED ORDER — OXYCODONE HCL 5 MG PO TABS: 5.0000 mg | ORAL_TABLET | ORAL | 0 refills | Status: DC | PRN

## 2018-11-08 MED ORDER — OXYCODONE-ACETAMINOPHEN 5-325 MG PO TABS
ORAL_TABLET | ORAL | Status: AC
  Administered 2018-11-08: 1
  Filled 2018-11-08: qty 1

## 2018-11-08 NOTE — ED Notes (Signed)
Pt teaching provided on medications that may cause drowsiness. Pt instructed not to drive or operate heavy machinery while taking the prescribed medication. Pt verbalized understanding.   

## 2018-11-08 NOTE — ED Notes (Signed)
Patient requested pain meds prior to having U/S done

## 2018-11-08 NOTE — Discharge Instructions (Addendum)
You were evaluated in the Emergency Department and after careful evaluation, we did not find any emergent condition requiring admission or further testing in the hospital.  Your symptoms today seem to be due to a testicular infarction.  In other words, it appears that because of minor trauma to the testicle, part of the testicle has lost blood flow and is slowly dying.  This can be very painful but is not an emergency and will improve on its own.  Please use Tylenol or ibuprofen as needed at home for pain.  We are also providing you with oxycodone which is a stronger medicine that can be used for more significant pain.  We also recommend using briefs to support the scrotum and resting for several days, avoiding exertional activity.  Please return to the Emergency Department if you experience any worsening of your condition.  We encourage you to follow up with a primary care provider.  Thank you for allowing [place] to be a part of your care.

## 2018-11-08 NOTE — ED Triage Notes (Addendum)
Left testicle swelling since last night. Denies injury

## 2018-11-08 NOTE — ED Provider Notes (Signed)
[facility] Emergency Department Provider Note MRN:  [id number]  Arrival date & time: 11/08/18     Chief Complaint   Testicle Pain   History of Present Illness   [name] is a 73 y.o. year-old male with a history of hyperlipidemia presenting to the ED with chief complaint of testicle pain.  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  He explains that the bobcat is a pretty bumpy ride, but did not experience any significant pain or discomfort yesterday.  Woke up today with progressively worsening left testicular pain, swelling.  Became pretty severe, 8 out of 10, constant, prompting ED visit.  Patient denies fever, no headache or vision change, no nausea or vomiting, no chest pain or shortness of breath, no abdominal pain.  Endorsing continued occasional lower back pain which is unchanged and chronic for him.  Denies bowel or bladder dysfunction, no numbness weakness to the arms or legs.  Review of Systems  A complete 10 system review of systems was obtained and all systems are negative except as noted in the HPI and PMH.   Patient's Health History    Past Medical History:  Diagnosis Date  . Depression   . Hyperlipidemia   . Osteopenia     Past Surgical History:  Procedure Laterality Date  . LUMBAR LAMINECTOMY    . ROTATOR CUFF REPAIR Right   . UMBILICAL HERNIA REPAIR      No family history on file.  Social History   Socioeconomic History  . Marital status: Married    Spouse name: Not on file  . Number of children: Not on file  . Years of education: Not on file  . Highest education level: Not on file  Occupational History  . Not on file  Social Needs  . Financial resource strain: Not on file  . Food insecurity:    Worry: Not on file    Inability: Not on file  . Transportation needs:    Medical: Not on file    Non-medical: Not on file  Tobacco Use  .  Smoking status: Former Research scientist (life sciences)  . Smokeless tobacco: Never Used  Substance and Sexual Activity  . Alcohol use: Yes    Alcohol/week: 0.0 standard drinks    Comment: beer occasional  . Drug use: Never  . Sexual activity: Not on file  Lifestyle  . Physical activity:    Days per week: Not on file    Minutes per session: Not on file  . Stress: Not on file  Relationships  . Social connections:    Talks on phone: Not on file    Gets together: Not on file    Attends religious service: Not on file    Active member of club or organization: Not on file    Attends meetings of clubs or organizations: Not on file    Relationship status: Not on file  . Intimate partner violence:    Fear of current or ex partner: Not on file    Emotionally abused: Not on file    Physically abused: Not on file    Forced sexual activity: Not on file  Other Topics Concern  . Not on file  Social History Narrative  . Not on file     Physical Exam  Vital Signs and Nursing Notes reviewed Vitals:   11/08/18 1851  BP: 120/79  Pulse: (!) 102  Resp: 18  Temp: 98.3 F (36.8 C)  SpO2: 98%    CONSTITUTIONAL: Well-appearing, NAD NEURO:  Alert and oriented x 3, no focal deficits EYES:  eyes equal and reactive ENT/NECK:  no LAD, no JVD CARDIO: Regular rate, well-perfused, normal S1 and S2 PULM:  CTAB no wheezing or rhonchi GI/GU:  normal bowel sounds, non-distended, non-tender; mildly swollen and erythematous or bruised left testicle with normal lie, moderate tenderness to palpation MSK/SPINE:  No gross deformities, no edema SKIN:  no rash, atraumatic PSYCH:  Appropriate speech and behavior  Diagnostic and Interventional Summary    Labs Reviewed  URINALYSIS, ROUTINE W REFLEX MICROSCOPIC - Abnormal; Notable for the following components:      Result Value   Specific Gravity, Urine <1.005 (*)    Hgb urine dipstick TRACE (*)    All other components within normal limits  URINALYSIS, MICROSCOPIC (REFLEX) -  Abnormal; Notable for the following components:   Bacteria, UA RARE (*)    All other components within normal limits    US SCROTUM W/DOPPLER  Final Result      Medications  oxyCODONE-acetaminophen (PERCOCET/ROXICET) 5-325 MG per tablet 1 tablet (1 tablet Oral Not Given 11/08/18 2044)  oxyCODONE-acetaminophen (PERCOCET/ROXICET) 5-325 MG per tablet (1 tablet  Given 11/08/18 1945)     Procedures  EMERGENCY DEPARTMENT ULTRASOUND  Study: Limited Retroperitoneal Ultrasound of the Abdominal Aorta.  INDICATIONS:Back pain and Age>55 Multiple views of the abdominal aorta were obtained in real-time from the diaphragmatic hiatus to the aortic bifurcation in transverse planes with a multi-frequency probe.  PERFORMED BY: Myself IMAGES ARCHIVED?: Yes LIMITATIONS:  Body habitus and Bowel gas INTERPRETATION:  No abdominal aortic aneurysm   Critical Care  ED Course and Medical Decision Making  I have reviewed the triage vital signs and the nursing notes.  Pertinent labs & imaging results that were available during my care of the patient were reviewed by me and considered in my medical decision making (see below for details).  Considering epididymitis versus testicular trauma in this 73 year old male with unilateral testicular pain and swelling.  Urinalysis is unrevealing.  Called by radiology for emergent findings on ultrasound, concern for intermittent torsion of the left testicle.  Consulted urology, who reviewed the read and images.  They are confident that this is more consistent with an infarction of the large portion of the left testicle.  Management is supportive.  Advised briefs for scrotal support, Tylenol, ibuprofen, provided with short course oxycodone for pain management.  Advised to avoid exertional activities for the next several days.  After the discussed management above, the patient was determined to be safe for discharge.  The patient was in agreement with this plan and all questions  regarding their care were answered.  ED return precautions were discussed and the patient will return to the ED with any significant worsening of condition.  Barth Kirks. [place]onia Small, [place] [email or web address]  Final Clinical Impressions(s) / ED Diagnoses     ICD-10-CM   1. Testicular infarct, left N50.1   2. Testicle swelling N50.89 US SCROTUM W/DOPPLER    US SCROTUM W/DOPPLER    ED Discharge Orders         Ordered    oxyCODONE (ROXICODONE) 5 MG immediate release tablet  Every 4 hours PRN     11/08/18 2109             Maudie Flakes, MD 11/08/18 2115

## 2018-11-14 DIAGNOSIS — N5082 Scrotal pain: Secondary | ICD-10-CM | POA: Diagnosis not present

## 2018-11-14 DIAGNOSIS — N50812 Left testicular pain: Secondary | ICD-10-CM | POA: Diagnosis not present

## 2018-11-14 DIAGNOSIS — R93812 Abnormal radiologic findings on diagnostic imaging of left testicle: Secondary | ICD-10-CM | POA: Diagnosis not present

## 2018-11-17 ENCOUNTER — Other Ambulatory Visit: Payer: Self-pay | Admitting: Urology

## 2018-11-17 DIAGNOSIS — N5082 Scrotal pain: Secondary | ICD-10-CM

## 2018-11-17 DIAGNOSIS — R609 Edema, unspecified: Secondary | ICD-10-CM

## 2018-11-17 DIAGNOSIS — N50819 Testicular pain, unspecified: Secondary | ICD-10-CM

## 2018-11-21 ENCOUNTER — Ambulatory Visit
Admission: RE | Admit: 2018-11-21 | Discharge: 2018-11-21 | Disposition: A | Discharge status: Home / Self Care | Payer: Medicare Other | Source: Ambulatory Visit | Attending: Urology | Admitting: Urology

## 2018-11-21 ENCOUNTER — Other Ambulatory Visit: Payer: Self-pay

## 2018-11-21 DIAGNOSIS — N5082 Scrotal pain: Secondary | ICD-10-CM

## 2018-11-21 DIAGNOSIS — R609 Edema, unspecified: Secondary | ICD-10-CM

## 2018-11-21 DIAGNOSIS — N50819 Testicular pain, unspecified: Secondary | ICD-10-CM

## 2018-12-03 ENCOUNTER — Other Ambulatory Visit: Payer: Self-pay

## 2018-12-03 ENCOUNTER — Ambulatory Visit
Admission: RE | Admit: 2018-12-03 | Discharge: 2018-12-03 | Disposition: A | Discharge status: Home / Self Care | Payer: Medicare Other | Source: Ambulatory Visit | Attending: Urology | Admitting: Urology

## 2018-12-03 DIAGNOSIS — N5082 Scrotal pain: Secondary | ICD-10-CM | POA: Diagnosis not present

## 2018-12-05 DIAGNOSIS — N50812 Left testicular pain: Secondary | ICD-10-CM | POA: Diagnosis not present

## 2018-12-05 DIAGNOSIS — R93812 Abnormal radiologic findings on diagnostic imaging of left testicle: Secondary | ICD-10-CM | POA: Diagnosis not present

## 2018-12-13 DIAGNOSIS — C44320 Squamous cell carcinoma of skin of unspecified parts of face: Secondary | ICD-10-CM

## 2018-12-13 HISTORY — DX: Squamous cell carcinoma of skin of unspecified parts of face: C44.320

## 2018-12-23 ENCOUNTER — Other Ambulatory Visit: Payer: Self-pay | Admitting: Urology

## 2019-01-07 ENCOUNTER — Encounter (HOSPITAL_BASED_OUTPATIENT_CLINIC_OR_DEPARTMENT_OTHER): Payer: Self-pay | Admitting: *Deleted

## 2019-01-07 ENCOUNTER — Other Ambulatory Visit: Payer: Self-pay

## 2019-01-07 NOTE — Progress Notes (Addendum)
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

## 2019-01-08 ENCOUNTER — Other Ambulatory Visit (HOSPITAL_COMMUNITY)
Admission: RE | Admit: 2019-01-08 | Discharge: 2019-01-08 | Disposition: A | Discharge status: Home / Self Care | Payer: Medicare Other | Source: Ambulatory Visit | Attending: Urology | Admitting: Urology

## 2019-01-08 DIAGNOSIS — Z1159 Encounter for screening for other viral diseases: Secondary | ICD-10-CM | POA: Diagnosis not present

## 2019-01-09 LAB — NOVEL CORONAVIRUS, NAA (HOSP ORDER, SEND-OUT TO REF LAB; TAT 18-24 HRS): SARS-CoV-2, NAA: NOT DETECTED

## 2019-01-09 NOTE — Progress Notes (Signed)
SPOKE W/  _unable to reach  Message left  Reminded to self quarantine     SCREENING SYMPTOMS OF COVID 19:   COUGH--  RUNNY NOSE---   SORE THROAT---  NASAL CONGESTION----  SNEEZING----  SHORTNESS OF BREATH---  DIFFICULTY BREATHING---  TEMP >100.0 -----  UNEXPLAINED BODY ACHES------  CHILLS --------   HEADACHES ---------  LOSS OF SMELL/ TASTE --------    HAVE YOU OR ANY FAMILY MEMBER TRAVELLED PAST 14 DAYS OUT OF THE   COUNTY--- STATE---- COUNTRY----  HAVE YOU OR ANY FAMILY MEMBER BEEN EXPOSED TO ANYONE WITH COVID 19?

## 2019-01-12 ENCOUNTER — Encounter (HOSPITAL_BASED_OUTPATIENT_CLINIC_OR_DEPARTMENT_OTHER): Payer: Self-pay | Admitting: *Deleted

## 2019-01-12 ENCOUNTER — Ambulatory Visit (HOSPITAL_BASED_OUTPATIENT_CLINIC_OR_DEPARTMENT_OTHER): Payer: Medicare Other | Admitting: Anesthesiology

## 2019-01-12 ENCOUNTER — Encounter (HOSPITAL_BASED_OUTPATIENT_CLINIC_OR_DEPARTMENT_OTHER)
Admission: RE | Disposition: A | Discharge status: Home / Self Care | Payer: Self-pay | Source: Home / Self Care | Attending: Urology

## 2019-01-12 ENCOUNTER — Other Ambulatory Visit: Payer: Self-pay

## 2019-01-12 ENCOUNTER — Ambulatory Visit (HOSPITAL_BASED_OUTPATIENT_CLINIC_OR_DEPARTMENT_OTHER)
Admission: RE | Admit: 2019-01-12 | Discharge: 2019-01-12 | Disposition: A | Discharge status: Home / Self Care | Payer: Medicare Other | Source: Home / Self Care | Attending: Urology | Admitting: Urology

## 2019-01-12 DIAGNOSIS — N5089 Other specified disorders of the male genital organs: Secondary | ICD-10-CM | POA: Insufficient documentation

## 2019-01-12 DIAGNOSIS — K219 Gastro-esophageal reflux disease without esophagitis: Secondary | ICD-10-CM | POA: Diagnosis not present

## 2019-01-12 DIAGNOSIS — Z20828 Contact with and (suspected) exposure to other viral communicable diseases: Secondary | ICD-10-CM | POA: Diagnosis not present

## 2019-01-12 DIAGNOSIS — Z888 Allergy status to other drugs, medicaments and biological substances status: Secondary | ICD-10-CM | POA: Diagnosis not present

## 2019-01-12 DIAGNOSIS — D4012 Neoplasm of uncertain behavior of left testis: Secondary | ICD-10-CM | POA: Diagnosis not present

## 2019-01-12 DIAGNOSIS — Z79899 Other long term (current) drug therapy: Secondary | ICD-10-CM | POA: Insufficient documentation

## 2019-01-12 DIAGNOSIS — N498 Inflammatory disorders of other specified male genital organs: Secondary | ICD-10-CM | POA: Diagnosis not present

## 2019-01-12 DIAGNOSIS — I743 Embolism and thrombosis of arteries of the lower extremities: Secondary | ICD-10-CM | POA: Diagnosis not present

## 2019-01-12 DIAGNOSIS — I998 Other disorder of circulatory system: Secondary | ICD-10-CM | POA: Diagnosis not present

## 2019-01-12 DIAGNOSIS — M25561 Pain in right knee: Secondary | ICD-10-CM | POA: Diagnosis not present

## 2019-01-12 DIAGNOSIS — J45909 Unspecified asthma, uncomplicated: Secondary | ICD-10-CM | POA: Diagnosis not present

## 2019-01-12 DIAGNOSIS — Z1159 Encounter for screening for other viral diseases: Secondary | ICD-10-CM | POA: Diagnosis not present

## 2019-01-12 DIAGNOSIS — E785 Hyperlipidemia, unspecified: Secondary | ICD-10-CM | POA: Diagnosis not present

## 2019-01-12 DIAGNOSIS — Z8249 Family history of ischemic heart disease and other diseases of the circulatory system: Secondary | ICD-10-CM | POA: Diagnosis not present

## 2019-01-12 DIAGNOSIS — N433 Hydrocele, unspecified: Secondary | ICD-10-CM | POA: Insufficient documentation

## 2019-01-12 DIAGNOSIS — M8580 Other specified disorders of bone density and structure, unspecified site: Secondary | ICD-10-CM | POA: Diagnosis not present

## 2019-01-12 DIAGNOSIS — F329 Major depressive disorder, single episode, unspecified: Secondary | ICD-10-CM | POA: Diagnosis not present

## 2019-01-12 DIAGNOSIS — I745 Embolism and thrombosis of iliac artery: Secondary | ICD-10-CM | POA: Diagnosis not present

## 2019-01-12 DIAGNOSIS — N501 Vascular disorders of male genital organs: Secondary | ICD-10-CM | POA: Insufficient documentation

## 2019-01-12 DIAGNOSIS — Z87891 Personal history of nicotine dependence: Secondary | ICD-10-CM | POA: Insufficient documentation

## 2019-01-12 HISTORY — PX: ORCHIECTOMY: SHX2116

## 2019-01-12 HISTORY — DX: Gastro-esophageal reflux disease without esophagitis: K21.9

## 2019-01-12 SURGERY — ORCHIECTOMY
Anesthesia: General | Laterality: Left

## 2019-01-12 MED ORDER — SUCCINYLCHOLINE CHLORIDE 200 MG/10ML IV SOSY
PREFILLED_SYRINGE | INTRAVENOUS | Status: DC | PRN
  Administered 2019-01-12: 100 mg via INTRAVENOUS

## 2019-01-12 MED ORDER — GLYCOPYRROLATE PF 0.2 MG/ML IJ SOSY
PREFILLED_SYRINGE | INTRAMUSCULAR | Status: DC | PRN
  Administered 2019-01-12: .2 mg via INTRAVENOUS

## 2019-01-12 MED ORDER — ROCURONIUM BROMIDE 10 MG/ML (PF) SYRINGE
PREFILLED_SYRINGE | INTRAVENOUS | Status: DC | PRN
  Administered 2019-01-12: 10 mg via INTRAVENOUS
  Administered 2019-01-12: 20 mg via INTRAVENOUS

## 2019-01-12 MED ORDER — FENTANYL CITRATE (PF) 100 MCG/2ML IJ SOLN
INTRAMUSCULAR | Status: AC
  Filled 2019-01-12: qty 2

## 2019-01-12 MED ORDER — ONDANSETRON HCL 4 MG/2ML IJ SOLN
INTRAMUSCULAR | Status: AC
  Filled 2019-01-12: qty 2

## 2019-01-12 MED ORDER — LIDOCAINE 2% (20 MG/ML) 5 ML SYRINGE
INTRAMUSCULAR | Status: DC | PRN
  Administered 2019-01-12: 100 mg via INTRAVENOUS

## 2019-01-12 MED ORDER — EPHEDRINE 5 MG/ML INJ
INTRAVENOUS | Status: AC
  Filled 2019-01-12: qty 10

## 2019-01-12 MED ORDER — EPHEDRINE SULFATE-NACL 50-0.9 MG/10ML-% IV SOSY
PREFILLED_SYRINGE | INTRAVENOUS | Status: DC | PRN
  Administered 2019-01-12: 10 mg via INTRAVENOUS

## 2019-01-12 MED ORDER — HYDROCODONE-ACETAMINOPHEN 5-325 MG PO TABS
1.0000 | ORAL_TABLET | Freq: Once | ORAL | Status: AC
  Administered 2019-01-12: 1 via ORAL
  Filled 2019-01-12: qty 1

## 2019-01-12 MED ORDER — CEFAZOLIN SODIUM-DEXTROSE 2-4 GM/100ML-% IV SOLN
INTRAVENOUS | Status: AC
  Filled 2019-01-12: qty 100

## 2019-01-12 MED ORDER — ROCURONIUM BROMIDE 10 MG/ML (PF) SYRINGE
PREFILLED_SYRINGE | INTRAVENOUS | Status: AC
  Filled 2019-01-12: qty 10

## 2019-01-12 MED ORDER — CEFAZOLIN SODIUM-DEXTROSE 2-4 GM/100ML-% IV SOLN
2.0000 g | INTRAVENOUS | Status: AC
  Administered 2019-01-12: 2 g via INTRAVENOUS
  Filled 2019-01-12: qty 100

## 2019-01-12 MED ORDER — SUCCINYLCHOLINE CHLORIDE 200 MG/10ML IV SOSY
PREFILLED_SYRINGE | INTRAVENOUS | Status: AC
  Filled 2019-01-12: qty 10

## 2019-01-12 MED ORDER — BUPIVACAINE HCL (PF) 0.25 % IJ SOLN
INTRAMUSCULAR | Status: DC | PRN
  Administered 2019-01-12: 10 mL

## 2019-01-12 MED ORDER — LIDOCAINE 2% (20 MG/ML) 5 ML SYRINGE
INTRAMUSCULAR | Status: AC
  Filled 2019-01-12: qty 5

## 2019-01-12 MED ORDER — ACETAMINOPHEN 500 MG PO TABS
ORAL_TABLET | ORAL | Status: AC
  Filled 2019-01-12: qty 2

## 2019-01-12 MED ORDER — FENTANYL CITRATE (PF) 100 MCG/2ML IJ SOLN
25.0000 ug | INTRAMUSCULAR | Status: DC | PRN
  Administered 2019-01-12: 25 ug via INTRAVENOUS
  Administered 2019-01-12: 16:00:00 50 ug via INTRAVENOUS
  Filled 2019-01-12: qty 1

## 2019-01-12 MED ORDER — SUGAMMADEX SODIUM 200 MG/2ML IV SOLN
INTRAVENOUS | Status: AC
  Filled 2019-01-12: qty 2

## 2019-01-12 MED ORDER — PROPOFOL 10 MG/ML IV BOLUS
INTRAVENOUS | Status: DC | PRN
  Administered 2019-01-12: 160 mg via INTRAVENOUS

## 2019-01-12 MED ORDER — ONDANSETRON HCL 4 MG/2ML IJ SOLN
INTRAMUSCULAR | Status: DC | PRN
  Administered 2019-01-12: 4 mg via INTRAVENOUS

## 2019-01-12 MED ORDER — HYDROCODONE-ACETAMINOPHEN 5-325 MG PO TABS: 1.0000 | ORAL_TABLET | ORAL | 0 refills | Status: DC | PRN

## 2019-01-12 MED ORDER — FENTANYL CITRATE (PF) 100 MCG/2ML IJ SOLN
INTRAMUSCULAR | Status: DC | PRN
  Administered 2019-01-12: 25 ug via INTRAVENOUS
  Administered 2019-01-12: 50 ug via INTRAVENOUS
  Administered 2019-01-12 (×5): 25 ug via INTRAVENOUS

## 2019-01-12 MED ORDER — HYDROCODONE-ACETAMINOPHEN 5-325 MG PO TABS
ORAL_TABLET | ORAL | Status: AC
  Filled 2019-01-12: qty 1

## 2019-01-12 MED ORDER — ACETAMINOPHEN 500 MG PO TABS
1000.0000 mg | ORAL_TABLET | Freq: Once | ORAL | Status: AC
  Administered 2019-01-12: 12:00:00 1000 mg via ORAL
  Filled 2019-01-12: qty 2

## 2019-01-12 MED ORDER — DEXAMETHASONE SODIUM PHOSPHATE 10 MG/ML IJ SOLN
INTRAMUSCULAR | Status: AC
  Filled 2019-01-12: qty 1

## 2019-01-12 MED ORDER — DEXAMETHASONE SODIUM PHOSPHATE 10 MG/ML IJ SOLN
INTRAMUSCULAR | Status: DC | PRN
  Administered 2019-01-12: 5 mg via INTRAVENOUS

## 2019-01-12 MED ORDER — LACTATED RINGERS IV SOLN
INTRAVENOUS | Status: DC
  Administered 2019-01-12 (×2): via INTRAVENOUS
  Filled 2019-01-12: qty 1000

## 2019-01-12 MED ORDER — ONDANSETRON HCL 4 MG/2ML IJ SOLN
4.0000 mg | Freq: Once | INTRAMUSCULAR | Status: DC | PRN
  Filled 2019-01-12: qty 2

## 2019-01-12 MED ORDER — GLYCOPYRROLATE PF 0.2 MG/ML IJ SOSY
PREFILLED_SYRINGE | INTRAMUSCULAR | Status: AC
  Filled 2019-01-12: qty 1

## 2019-01-12 SURGICAL SUPPLY — 37 items
ADH SKN CLS APL DERMABOND .7 (GAUZE/BANDAGES/DRESSINGS) ×1
BLADE CLIPPER SURG (BLADE) ×3 IMPLANT
BLADE HEX COATED 2.75 (ELECTRODE) ×3 IMPLANT
BLADE SURG 15 STRL LF DISP TIS (BLADE) ×1 IMPLANT
BLADE SURG 15 STRL SS (BLADE) ×3
BNDG GAUZE ELAST 4 BULKY (GAUZE/BANDAGES/DRESSINGS) ×3 IMPLANT
COVER BACK TABLE 60X90IN (DRAPES) ×3 IMPLANT
COVER MAYO STAND STRL (DRAPES) ×3 IMPLANT
COVER WAND RF STERILE (DRAPES) ×3 IMPLANT
DERMABOND ADVANCED (GAUZE/BANDAGES/DRESSINGS) ×2
DERMABOND ADVANCED .7 DNX12 (GAUZE/BANDAGES/DRESSINGS) ×1 IMPLANT
DRAIN PENROSE 18X1/4 LTX STRL (WOUND CARE) ×4 IMPLANT
DRAPE LAPAROTOMY 100X72 PEDS (DRAPES) ×3 IMPLANT
DRSG TEGADERM 4X4.75 (GAUZE/BANDAGES/DRESSINGS) ×3 IMPLANT
ELECT REM PT RETURN 9FT ADLT (ELECTROSURGICAL) ×3
ELECTRODE REM PT RTRN 9FT ADLT (ELECTROSURGICAL) ×1 IMPLANT
GOWN STRL REUS W/ TWL LRG LVL3 (GOWN DISPOSABLE) ×1 IMPLANT
GOWN STRL REUS W/TWL LRG LVL3 (GOWN DISPOSABLE) ×3
HEMOSTAT SURGICEL 2X14 (HEMOSTASIS) ×2 IMPLANT
NEEDLE HYPO 22GX1.5 SAFETY (NEEDLE) ×3 IMPLANT
NS IRRIG 1000ML POUR BTL (IV SOLUTION) ×3 IMPLANT
PACK BASIN DAY SURGERY FS (CUSTOM PROCEDURE TRAY) ×3 IMPLANT
PENCIL BUTTON HOLSTER BLD 10FT (ELECTRODE) ×3 IMPLANT
SPONGE LAP 18X18 RF (DISPOSABLE) ×2 IMPLANT
SUPPORT SCROTAL LG STRP (MISCELLANEOUS) ×2 IMPLANT
SUPPORTER ATHLETIC LG (MISCELLANEOUS) ×1
SUT CHROMIC 3 0 SH 27 (SUTURE) ×6 IMPLANT
SUT MNCRL AB 4-0 PS2 18 (SUTURE) ×2 IMPLANT
SUT SILK 2 0 (SUTURE) ×3
SUT SILK 2 0 SH (SUTURE) ×2 IMPLANT
SUT SILK 2-0 18XBRD TIE 12 (SUTURE) IMPLANT
SUT VIC AB 2-0 UR5 27 (SUTURE) ×2 IMPLANT
SUT VICRYL 0 TIES 12 18 (SUTURE) ×2 IMPLANT
SYR CONTROL 10ML LL (SYRINGE) ×3 IMPLANT
TUBE CONNECTING 12'X1/4 (SUCTIONS) ×1
TUBE CONNECTING 12X1/4 (SUCTIONS) ×2 IMPLANT
YANKAUER SUCT BULB TIP NO VENT (SUCTIONS) ×3 IMPLANT

## 2019-01-12 NOTE — H&P (Signed)
CC/HPI: CC: Left testicular pain/swelling  HPI:  11/14/2018  73 year old male who went to the emergency department about 1 week ago with left testicular pain and swelling. He underwent scrotal ultrasound on 11/08/2018 that revealed findings suspicious for possible incomplete torsion of the left testicle due to absent flow to the superior portion and dampened arterial waveforms. He had small bilateral hydroceles. The superior portion of the testicle was heterogeneous but no evidence of mass. At that time, he was having significant pain on that left side. He denies hematuria or dysuria. Denies voiding complaints. He denies systemic symptoms. He denies unexplained weight loss.   12/05/2018  Patient states that this test was gotten much smaller. It is now firm however. He underwent a scrotal ultrasound. This revealed interval development of complex and heterogeneous abnormality within the left testicle, most likely representing an area of infarction. There was blood flow within the abnormality. His pain has resolved.     ALLERGIES: No Allergies    MEDICATIONS: Allergy Medication  Zoloft     GU PSH: No GU PSH      PSH Notes: Gastric surgery   NON-GU PSH: Lumbar Laminectomy Shoulder Surgery (Unspecified)    GU PMH: Left testicular pain - 11/14/2018 Scrotal pain - 11/14/2018    NON-GU PMH: Abnormal radiologic findings on diagnostic imaging of left testicle - 11/14/2018 Asthma GERD    FAMILY HISTORY: 2 daughters - Other Cancer - Father   SOCIAL HISTORY: Marital Status: Married Preferred Language: English; Race: White Current Smoking Status: Patient does not smoke anymore. Has not smoked since 11/12/1990.   Tobacco Use Assessment Completed: Used Tobacco in last 30 days? Drinks 2 caffeinated drinks per day.    REVIEW OF SYSTEMS:    GU Review Male:   Patient denies frequent urination, hard to postpone urination, burning/ pain with urination, get up at night to urinate, leakage of urine, stream  starts and stops, trouble starting your stream, have to strain to urinate , erection problems, and penile pain.  Gastrointestinal (Upper):   Patient denies nausea, vomiting, and indigestion/ heartburn.  Gastrointestinal (Lower):   Patient denies diarrhea and constipation.  Constitutional:   Patient denies fever, night sweats, weight loss, and fatigue.  Skin:   Patient denies skin rash/ lesion and itching.  Eyes:   Patient denies blurred vision and double vision.  Ears/ Nose/ Throat:   Patient denies sinus problems and sore throat.  Hematologic/Lymphatic:   Patient denies swollen glands and easy bruising.  Cardiovascular:   Patient denies leg swelling and chest pains.  Respiratory:   Patient denies cough and shortness of breath.  Endocrine:   Patient denies excessive thirst.  Musculoskeletal:   Patient denies back pain and joint pain.  Neurological:   Patient denies headaches and dizziness.  Psychologic:   Patient denies depression and anxiety.   VITAL SIGNS:      12/05/2018 08:42 AM  BP 127/82 mmHg  Heart Rate 68 /min  Temperature 97.4 F / 36.3 C   GU PHYSICAL EXAMINATION:    Testes: Right testicle normal. Left testicle is very firm/hard   MULTI-SYSTEM PHYSICAL EXAMINATION:    Constitutional: Well-nourished. No physical deformities. Normally developed. Good grooming.  Respiratory: No labored breathing, no use of accessory muscles.   Cardiovascular: Normal temperature, adequate perfusion of extremities  Skin: No paleness, no jaundice  Neurologic / Psychiatric: Oriented to time, oriented to place, oriented to person. No depression, no anxiety, no agitation.  Gastrointestinal: No mass, no tenderness, no rigidity, non obese abdomen.  Eyes: Normal conjunctivae. Normal eyelids.  Musculoskeletal: Normal gait and station of head and neck.     PAST DATA REVIEWED:  Source Of History:  Patient  X-Ray Review: Scrotal Ultrasound: Reviewed Films. Reviewed Report. Discussed With Patient.      PROCEDURES:          Urinalysis Dipstick Dipstick Cont'd  Color: Yellow Bilirubin: Neg mg/dL  Appearance: Clear Ketones: Neg mg/dL  Specific Gravity: 1.015 Blood: Neg ery/uL  pH: 6.5 Protein: Neg mg/dL  Glucose: Neg mg/dL Urobilinogen: 0.2 mg/dL    Nitrites: Neg    Leukocyte Esterase: Neg leu/uL    ASSESSMENT:      ICD-10 Details  2 GU:   Left testicular pain - N50.812   1 NON-GU:   Abnormal radiologic findings on diagnostic imaging of left testicle - Z36.644    PLAN:           Document Letter(s):  Created for Patient: Clinical Summary         Notes:   Given the abnormal exam as well as abnormal findings on ultrasound, recommend inguinal radical orchiectomy on the left. He understands potential risks including but not limited to bleeding, infection, injury to surrounding structures, need for additional procedures, hernia formation.   Cc: Dr. Shelia Media, M.D.   Signed by Link Snuffer, III, M.D. on 12/05/18 at 8:57 AM (EDT

## 2019-01-12 NOTE — Anesthesia Preprocedure Evaluation (Signed)
Anesthesia Evaluation  Patient identified by MRN, date of birth, ID band Patient awake    Reviewed: Allergy & Precautions, NPO status , Patient's Chart, lab work & pertinent test results  Airway Mallampati: II  TM Distance: >3 FB Neck ROM: Full    Dental  (+) Teeth Intact, Dental Advisory Given, Missing, Poor Dentition, Chipped   Pulmonary former smoker,    Pulmonary exam normal breath sounds clear to auscultation       Cardiovascular negative cardio ROS Normal cardiovascular exam Rhythm:Regular Rate:Normal     Neuro/Psych PSYCHIATRIC DISORDERS Depression negative neurological ROS     GI/Hepatic Neg liver ROS, GERD  Medicated and Controlled,  Endo/Other  negative endocrine ROS  Renal/GU negative Renal ROS   LEFT TESTICULAR MASS    Musculoskeletal negative musculoskeletal ROS (+)   Abdominal   Peds  Hematology negative hematology ROS (+)   Anesthesia Other Findings Day of surgery medications reviewed with the patient.  Reproductive/Obstetrics                             Anesthesia Physical Anesthesia Plan  ASA: II  Anesthesia Plan: General   Post-op Pain Management:    Induction: Intravenous  PONV Risk Score and Plan: 3 and Dexamethasone, Ondansetron and Diphenhydramine  Airway Management Planned: Oral ETT  Additional Equipment:   Intra-op Plan:   Post-operative Plan: Extubation in OR  Informed Consent: I have reviewed the patients History and Physical, chart, labs and discussed the procedure including the risks, benefits and alternatives for the proposed anesthesia with the patient or authorized representative who has indicated his/her understanding and acceptance.     Dental advisory given  Plan Discussed with: CRNA  Anesthesia Plan Comments:         Anesthesia Quick Evaluation

## 2019-01-12 NOTE — Discharge Instructions (Addendum)
Orchiectomy, Care After This sheet gives you information about how to care for yourself after your procedure. Your health care provider may also give you more specific instructions. If you have problems or questions, contact your health care provider. What can I expect after the procedure? After the procedure, it is common to have:  Pain.  Bruising.  Blood pooling (hematoma) in the area where your testicles were removed.  Depression or mood changes.  Fatigue.  Hot flashes. Follow these instructions at home: Managing pain and swelling  If directed, put ice on the affected area: ? Put ice in a plastic bag. ? Place a towel between your skin and the bag. ? Leave the ice on for 20 minutes, 2-3 times a day.  Wear scrotal support as told by your health care provider.  To relieve pressure and pain when sitting, you may use a donut cushion if directed by your health care provider. Incision care   Follow instructions from your health care provider about how to take care of your incision. Make sure you: ? Wash your hands with soap and water before you change your bandage (dressing). If soap and water are not available, use hand sanitizer. ? Change your dressing once a day, or as often as told by your health care provider. If the dressing sticks to your incision area: ? Use warm, soapy water or hydrogen peroxide to dampen the dressing. ? When the dressing becomes loose, lift it from the incision area. Make sure that the incision stays closed. ? Leave stitches (sutures), skin glue, or adhesive strips in place. These skin closures may need to stay in place for 2 weeks or longer. If adhesive strip edges start to loosen and curl up, you may trim the loose edges. Do not remove adhesive strips completely unless your health care provider tells you to do that.  Keep your dressing dry until it has been removed.  Check your incision area every day for signs of infection. Check for: ? More redness,  swelling, or pain. ? More fluid or blood. ? Warmth. ? Pus or a bad smell. Bathing  Do not take baths, swim, or use a hot tub until your health care provider approves. You may start taking showers two days after your procedure.  Do not rub your incision to dry it. Pat the area gently with a clean cloth or let it air-dry. Medicines  Take over-the-counter and prescription medicines only as told by your health care provider.  If you were prescribed an antibiotic medicine, use it as told by your health care provider. Do not stop using the antibiotic even if you start to feel better.  If you had both testicles removed, talk with your health care provider about medicine supplements to replace one of the male hormones (testosterone) that your body will no longer make. Driving  Do not drive for 24 hours if you were given a medicine to help you relax (sedative).  Do not drive or use heavy machinery while taking prescription pain medicines. Activity  Avoid activities that may cause your incision to open, such as jogging, playing sports, and straining with bowel movements. Ask your health care provider what activities are safe for you.  Do not lift anything that is heavier than 10 lb (4.5 kg), or the limit that your health care provider tells you, until he or she says that it is safe.  Do not engage in sexual activity until the area is healed and your health care provider approves.  This could take up to 4 weeks. General instructions  To prevent or treat constipation while you are taking prescription pain medicine, your health care provider may recommend that you: ? Drink enough fluid to keep your urine clear or pale yellow. ? Take over-the-counter or prescription medicines. ? Eat foods that are high in fiber, such as fresh fruits and vegetables, whole grains, and beans. ? Limit foods that are high in fat and processed sugars, such as fried and sweet foods.  Do not use any products that  contain nicotine or tobacco, such as cigarettes and e-cigarettes. If you need help quitting, ask your health care provider.  Keep all follow-up visits as told by your health care provider. This is important. Contact a health care provider if:  You have more pain, swelling, or redness in your genital or groin area.  You have more fluid or blood coming from your incision.  Your incision feels warm to the touch.  You have pus or a bad smell coming from your incision.  You have constipation that is not helped by changing your diet or drinking more fluid.  You develop nausea or vomiting.  You cannot eat or drink without vomiting. Get help right away if:  You have dizziness or nausea that does not go away.  You have trouble breathing.  You have a wet (congested) cough.  You have a fever or shaking chills.  Your incision breaks open after the skin closures have been removed.  You are not able to urinate. Summary  After this procedure, it is most common to have bruising or blood pooling in the area where the testicles were removed.  You should check your incision area every day for signs of infection, such as redness, swelling, pain, fluid, blood, warmth, pus, or a bad smell.  Avoid activities that may cause your incision to open, such as jogging, playing sports, and straining with bowel movements. Ask your health care provider what activities are safe for you.  You should not engage in sexual activity until the area is healed and your health care provider approves. This could take up to 4 weeks.  Men who have both testicles removed may have emotional and physical side effects. Your health care provider can help you with ways to manage those side effects. This information is not intended to replace advice given to you by your health care provider. Make sure you discuss any questions you have with your health care provider. Document Released: 04/01/2013 Document Revised: 06/21/2016  Document Reviewed: 06/21/2016 Elsevier Interactive Patient Education  2019 [facility] Care Instructions  Activity: Get plenty of rest for the remainder of the day. A responsible individual must stay with you for 24 hours following the procedure.  For the next 24 hours, DO NOT: -Drive a car -Paediatric nurse -Drink alcoholic beverages -Take any medication unless instructed by your physician -Make any legal decisions or sign important papers.  Meals: Start with liquid foods such as gelatin or soup. Progress to regular foods as tolerated. Avoid greasy, spicy, heavy foods. If nausea and/or vomiting occur, drink only clear liquids until the nausea and/or vomiting subsides. Call your physician if vomiting continues.  Special Instructions/Symptoms: Your throat may feel dry or sore from the anesthesia or the breathing tube placed in your throat during surgery. If this causes discomfort, gargle with warm salt water. The discomfort should disappear within 24 hours.  If you had a scopolamine patch placed behind your ear for the  management of post- operative nausea and/or vomiting:  1. The medication in the patch is effective for 72 hours, after which it should be removed.  Wrap patch in a tissue and discard in the trash. Wash hands thoroughly with soap and water. 2. You may remove the patch earlier than 72 hours if you experience unpleasant side effects which may include dry mouth, dizziness or visual disturbances. 3. Avoid touching the patch. Wash your hands with soap and water after contact with the patch.

## 2019-01-12 NOTE — Op Note (Signed)
Operative Note  Preoperative diagnosis:  1.  Left testicular mass  Postoperative diagnosis: 1.  Left testicular mass  Procedure(s): 1.  Left inguinal radical orchiectomy  Surgeon: Link Snuffer, MD  Assistants: Noemi Chapel, MD, resident  Anesthesia: General  Complications: None immediate  EBL: 350 cc  Specimens: 1.  Left testicle and spermatic cord  Drains/Catheters: 1.  None  Intraoperative findings: Left testicular mass  Indication: 73 year old male was found to have an enlarged testicle.  Imaging showed likely infarction of the testicle.  Examination revealed a firm left testicle and therefore he presents for the previously mentioned operation.  Description of procedure:  The patient was identified and consent was obtained.  The patient was taken to the operating room and placed in the supine position.  The patient was placed under general anesthesia.  Perioperative antibiotics were administered.  Patient was prepped and draped in a standard sterile fashion and a timeout was performed.  An approximately 4 cm left-sided inguinal incision was made sharply.  This was carried down with Bovie electrocautery.  The external oblique aponeurosis was encountered.  This was sharply incised and then extended proximally and distally with Metzenbaum scissors.  Blunt dissection was performed to identify the cord.  A Penrose drain was passed around the cord.  At this time, there was a moderate amount of bleeding.  After placing a clamp around the proximal portion of the cord, the bleeding stopped.  I then delivered the testicle into the incision and released gubernacular attachments.  The scrotum was not violated.  The cord was dissected as proximally as possible.  The vas deferens was separated from the vascular portion of the cord.  A clamp was placed around this.  And 2 clamps were placed around the more proximal portion of the cord.  2-0 silk suture was used to suture ligate the vas  deferens.  A stick tie was used on the distal portion of the cord and tied down.  The more distal clamp was released.  Another 2-0 silk tie was used to suture ligate the more proximal portion and the clamp was released.  There was no active bleeding noted from the stump.  Also thoroughly inspected the wound bed and there was no active bleeding noted.  I did place a piece of Surgicel in the bed of the incision.  The external oblique aponeurosis was then closed with a running 2-0 Vicryl.  A running 3-0 chromic was used to close the subcutaneous fascia.  A 4-0 Monocryl was then used to close the skin followed by Dermabond.  Quarter percent Marcaine was used for anesthetic effect.  Patient tolerated the procedure well and was stable postoperatively.  Plan: Follow-up in about 1 week for pathology review.

## 2019-01-12 NOTE — Anesthesia Procedure Notes (Signed)
Procedure Name: Intubation Date/Time: 01/12/2019 2:08 PM Performed by: Suan Halter, CRNA Pre-anesthesia Checklist: Patient identified, Emergency Drugs available, Suction available and Patient being monitored Patient Re-evaluated:Patient Re-evaluated prior to induction Oxygen Delivery Method: Circle system utilized Preoxygenation: Pre-oxygenation with 100% oxygen Induction Type: IV induction Ventilation: Mask ventilation without difficulty Laryngoscope Size: Mac and 3 Grade View: Grade I Tube type: Oral Tube size: 7.0 mm Number of attempts: 1 Airway Equipment and Method: Stylet and Oral airway Placement Confirmation: ETT inserted through vocal cords under direct vision,  positive ETCO2 and breath sounds checked- equal and bilateral Secured at: 22 cm Tube secured with: Tape Dental Injury: Teeth and Oropharynx as per pre-operative assessment

## 2019-01-12 NOTE — Transfer of Care (Signed)
Immediate Anesthesia Transfer of Care Note  Patient: [name]  Procedure(s) Performed: Procedure(s) (LRB): LEFT INGUINAL RADICAL ORCHIECTOMY (Left)  Patient Location: PACU  Anesthesia Type: General  Level of Consciousness: awake, oriented, sedated and patient cooperative  Airway & Oxygen Therapy: Patient Spontanous Breathing and Patient connected to face mask oxygen  Post-op Assessment: Report given to PACU RN and Post -op Vital signs reviewed and stable  Post vital signs: Reviewed and stable  Complications: No apparent anesthesia complications  Last Vitals:  Vitals Value Taken Time  BP    Temp    Pulse 84 01/12/2019  3:29 PM  Resp 15 01/12/2019  3:29 PM  SpO2 99 % 01/12/2019  3:29 PM  Vitals shown include unvalidated device data.  Last Pain:  Vitals:   01/12/19 1140  TempSrc: Oral

## 2019-01-12 NOTE — Interval H&P Note (Signed)
History and Physical Interval Note:  01/12/2019 1:56 PM  [name]  has presented today for surgery, with the diagnosis of LEFT TESTICULAR MASS.  The various methods of treatment have been discussed with the patient and family. After consideration of risks, benefits and other options for treatment, the patient has consented to  Procedure(s): LEFT INGUINAL RADICAL ORCHIECTOMY (Left) as a surgical intervention.  The patient's history has been reviewed, patient examined, no change in status, stable for surgery.  I have reviewed the patient's chart and labs.  Questions were answered to the patient's satisfaction.     Marton Redwood, III

## 2019-01-13 ENCOUNTER — Emergency Department (HOSPITAL_COMMUNITY): Payer: Medicare Other

## 2019-01-13 ENCOUNTER — Encounter (HOSPITAL_BASED_OUTPATIENT_CLINIC_OR_DEPARTMENT_OTHER): Payer: Self-pay | Admitting: Urology

## 2019-01-13 ENCOUNTER — Emergency Department (HOSPITAL_COMMUNITY): Payer: Medicare Other | Admitting: Certified Registered"

## 2019-01-13 ENCOUNTER — Encounter (HOSPITAL_COMMUNITY)
Admission: EM | Disposition: A | Discharge status: Home / Self Care | Payer: Self-pay | Source: Home / Self Care | Attending: Vascular Surgery

## 2019-01-13 ENCOUNTER — Inpatient Hospital Stay (HOSPITAL_COMMUNITY)
Admission: EM | Admit: 2019-01-13 | Discharge: 2019-01-14 | DRG: 254 | Disposition: A | Discharge status: Home / Self Care | Payer: Medicare Other | Attending: Vascular Surgery | Admitting: Vascular Surgery

## 2019-01-13 ENCOUNTER — Other Ambulatory Visit: Payer: Self-pay

## 2019-01-13 DIAGNOSIS — I771 Stricture of artery: Secondary | ICD-10-CM | POA: Diagnosis not present

## 2019-01-13 DIAGNOSIS — Z888 Allergy status to other drugs, medicaments and biological substances status: Secondary | ICD-10-CM

## 2019-01-13 DIAGNOSIS — M8580 Other specified disorders of bone density and structure, unspecified site: Secondary | ICD-10-CM | POA: Diagnosis not present

## 2019-01-13 DIAGNOSIS — Z8249 Family history of ischemic heart disease and other diseases of the circulatory system: Secondary | ICD-10-CM

## 2019-01-13 DIAGNOSIS — I998 Other disorder of circulatory system: Secondary | ICD-10-CM | POA: Diagnosis not present

## 2019-01-13 DIAGNOSIS — K219 Gastro-esophageal reflux disease without esophagitis: Secondary | ICD-10-CM | POA: Diagnosis not present

## 2019-01-13 DIAGNOSIS — Z20828 Contact with and (suspected) exposure to other viral communicable diseases: Secondary | ICD-10-CM | POA: Diagnosis not present

## 2019-01-13 DIAGNOSIS — J45909 Unspecified asthma, uncomplicated: Secondary | ICD-10-CM | POA: Diagnosis present

## 2019-01-13 DIAGNOSIS — I745 Embolism and thrombosis of iliac artery: Secondary | ICD-10-CM | POA: Diagnosis not present

## 2019-01-13 DIAGNOSIS — E785 Hyperlipidemia, unspecified: Secondary | ICD-10-CM | POA: Diagnosis present

## 2019-01-13 DIAGNOSIS — F329 Major depressive disorder, single episode, unspecified: Secondary | ICD-10-CM | POA: Diagnosis not present

## 2019-01-13 DIAGNOSIS — N433 Hydrocele, unspecified: Secondary | ICD-10-CM | POA: Diagnosis present

## 2019-01-13 DIAGNOSIS — Z1159 Encounter for screening for other viral diseases: Secondary | ICD-10-CM | POA: Diagnosis not present

## 2019-01-13 DIAGNOSIS — Z87891 Personal history of nicotine dependence: Secondary | ICD-10-CM | POA: Diagnosis not present

## 2019-01-13 DIAGNOSIS — I743 Embolism and thrombosis of arteries of the lower extremities: Secondary | ICD-10-CM | POA: Diagnosis not present

## 2019-01-13 HISTORY — DX: Embolism and thrombosis of iliac artery: I74.5

## 2019-01-13 HISTORY — PX: FEMORAL-FEMORAL BYPASS GRAFT: SHX936

## 2019-01-13 LAB — CBC
HCT: 38.5 % — ABNORMAL LOW (ref 39.0–52.0)
HCT: 35.7 % — ABNORMAL LOW (ref 39.0–52.0)
Hemoglobin: 12.6 g/dL — ABNORMAL LOW (ref 13.0–17.0)
Hemoglobin: 11.9 g/dL — ABNORMAL LOW (ref 13.0–17.0)
MCH: 32.4 pg (ref 26.0–34.0)
MCH: 32.2 pg (ref 26.0–34.0)
MCHC: 32.7 g/dL (ref 30.0–36.0)
MCHC: 33.3 g/dL (ref 30.0–36.0)
MCV: 99 fL (ref 80.0–100.0)
MCV: 96.5 fL (ref 80.0–100.0)
Platelets: 204 10*3/uL (ref 150–400)
Platelets: 184 10*3/uL (ref 150–400)
RBC: 3.89 MIL/uL — ABNORMAL LOW (ref 4.22–5.81)
RBC: 3.7 MIL/uL — ABNORMAL LOW (ref 4.22–5.81)
RDW: 13.4 % (ref 11.5–15.5)
RDW: 13.2 % (ref 11.5–15.5)
WBC: 11.3 10*3/uL — ABNORMAL HIGH (ref 4.0–10.5)
WBC: 9.2 10*3/uL (ref 4.0–10.5)
nRBC: 0 % (ref 0.0–0.2)
nRBC: 0 % (ref 0.0–0.2)

## 2019-01-13 LAB — TYPE AND SCREEN
ABO/RH(D): A POS
Antibody Screen: NEGATIVE

## 2019-01-13 LAB — BASIC METABOLIC PANEL
Anion gap: 8 (ref 5–15)
BUN: 14 mg/dL (ref 8–23)
CO2: 28 mmol/L (ref 22–32)
Calcium: 8.9 mg/dL (ref 8.9–10.3)
Chloride: 103 mmol/L (ref 98–111)
Creatinine, Ser: 0.99 mg/dL (ref 0.61–1.24)
GFR calc Af Amer: >60 mL/min (ref >60)
GFR calc non Af Amer: >60 mL/min (ref >60)
Glucose, Bld: 88 mg/dL (ref 70–99)
Potassium: 4 mmol/L (ref 3.5–5.1)
Sodium: 139 mmol/L (ref 135–145)

## 2019-01-13 LAB — SARS CORONAVIRUS 2 BY RT PCR (HOSPITAL ORDER, PERFORMED IN ~~LOC~~ HOSPITAL LAB): SARS Coronavirus 2: NEGATIVE

## 2019-01-13 SURGERY — CREATION, BYPASS, ARTERIAL, FEMORAL TO FEMORAL, USING GRAFT
Anesthesia: General | Site: Groin | Laterality: Left

## 2019-01-13 MED ORDER — POTASSIUM CHLORIDE CRYS ER 20 MEQ PO TBCR: 20.0000 meq | EXTENDED_RELEASE_TABLET | Freq: Every day | ORAL | Status: DC | PRN

## 2019-01-13 MED ORDER — PROPOFOL 10 MG/ML IV BOLUS
INTRAVENOUS | Status: AC
  Filled 2019-01-13: qty 20

## 2019-01-13 MED ORDER — DEXAMETHASONE SODIUM PHOSPHATE 10 MG/ML IJ SOLN
INTRAMUSCULAR | Status: AC
  Filled 2019-01-13: qty 1

## 2019-01-13 MED ORDER — FENTANYL CITRATE (PF) 250 MCG/5ML IJ SOLN
INTRAMUSCULAR | Status: AC
  Filled 2019-01-13: qty 5

## 2019-01-13 MED ORDER — ALUM & MAG HYDROXIDE-SIMETH 200-200-20 MG/5ML PO SUSP: 15.0000 mL | ORAL | Status: DC | PRN

## 2019-01-13 MED ORDER — FENTANYL CITRATE (PF) 250 MCG/5ML IJ SOLN
INTRAMUSCULAR | Status: DC | PRN
  Administered 2019-01-13: 50 ug via INTRAVENOUS
  Administered 2019-01-13: 150 ug via INTRAVENOUS
  Administered 2019-01-13: 50 ug via INTRAVENOUS

## 2019-01-13 MED ORDER — BISACODYL 5 MG PO TBEC: 5.0000 mg | DELAYED_RELEASE_TABLET | Freq: Every day | ORAL | Status: DC | PRN

## 2019-01-13 MED ORDER — SENNOSIDES-DOCUSATE SODIUM 8.6-50 MG PO TABS: 1.0000 | ORAL_TABLET | Freq: Every evening | ORAL | Status: DC | PRN

## 2019-01-13 MED ORDER — ONDANSETRON HCL 4 MG/2ML IJ SOLN: 4.0000 mg | Freq: Once | INTRAMUSCULAR | Status: DC | PRN

## 2019-01-13 MED ORDER — ACETAMINOPHEN 325 MG RE SUPP: 325.0000 mg | RECTAL | Status: DC | PRN

## 2019-01-13 MED ORDER — SODIUM CHLORIDE 0.9 % IV SOLN
INTRAVENOUS | Status: DC | PRN
  Administered 2019-01-13: 40 ug/min via INTRAVENOUS

## 2019-01-13 MED ORDER — SODIUM CHLORIDE 0.9 % IV SOLN: 500.0000 mL | Freq: Once | INTRAVENOUS | Status: DC | PRN

## 2019-01-13 MED ORDER — GLYCOPYRROLATE 0.2 MG/ML IJ SOLN
INTRAMUSCULAR | Status: DC | PRN
  Administered 2019-01-13: 0.2 mg via INTRAVENOUS

## 2019-01-13 MED ORDER — LACTATED RINGERS IV SOLN
INTRAVENOUS | Status: DC | PRN
  Administered 2019-01-13: 18:00:00 via INTRAVENOUS

## 2019-01-13 MED ORDER — ONDANSETRON HCL 4 MG/2ML IJ SOLN
INTRAMUSCULAR | Status: DC | PRN
  Administered 2019-01-13: 4 mg via INTRAVENOUS

## 2019-01-13 MED ORDER — LABETALOL HCL 5 MG/ML IV SOLN: 10.0000 mg | INTRAVENOUS | Status: DC | PRN

## 2019-01-13 MED ORDER — ACETAMINOPHEN 325 MG PO TABS: 325.0000 mg | ORAL_TABLET | ORAL | Status: DC | PRN

## 2019-01-13 MED ORDER — SODIUM CHLORIDE (PF) 0.9 % IJ SOLN
INTRAMUSCULAR | Status: AC
  Filled 2019-01-13: qty 50

## 2019-01-13 MED ORDER — HEPARIN SODIUM (PORCINE) 5000 UNIT/ML IJ SOLN
5000.0000 [IU] | Freq: Three times a day (TID) | INTRAMUSCULAR | Status: DC
  Administered 2019-01-14: 5000 [IU] via SUBCUTANEOUS
  Filled 2019-01-13: qty 1

## 2019-01-13 MED ORDER — LORATADINE 10 MG PO TABS
10.0000 mg | ORAL_TABLET | Freq: Every day | ORAL | Status: DC
  Administered 2019-01-14: 10 mg via ORAL
  Filled 2019-01-13: qty 1

## 2019-01-13 MED ORDER — EPHEDRINE SULFATE-NACL 50-0.9 MG/10ML-% IV SOSY
PREFILLED_SYRINGE | INTRAVENOUS | Status: DC | PRN
  Administered 2019-01-13: 5 mg via INTRAVENOUS
  Administered 2019-01-13: 10 mg via INTRAVENOUS
  Administered 2019-01-13: 5 mg via INTRAVENOUS

## 2019-01-13 MED ORDER — GUAIFENESIN-DM 100-10 MG/5ML PO SYRP: 15.0000 mL | ORAL_SOLUTION | ORAL | Status: DC | PRN

## 2019-01-13 MED ORDER — SODIUM CHLORIDE 0.9 % IV SOLN
INTRAVENOUS | Status: DC
  Administered 2019-01-13 (×3): via INTRAVENOUS

## 2019-01-13 MED ORDER — LIDOCAINE 2% (20 MG/ML) 5 ML SYRINGE
INTRAMUSCULAR | Status: AC
  Filled 2019-01-13: qty 5

## 2019-01-13 MED ORDER — LIDOCAINE 2% (20 MG/ML) 5 ML SYRINGE
INTRAMUSCULAR | Status: DC | PRN
  Administered 2019-01-13: 100 mg via INTRAVENOUS

## 2019-01-13 MED ORDER — HYDRALAZINE HCL 20 MG/ML IJ SOLN: 5.0000 mg | INTRAMUSCULAR | Status: DC | PRN

## 2019-01-13 MED ORDER — SODIUM CHLORIDE 0.9 % IV SOLN
INTRAVENOUS | Status: AC
  Filled 2019-01-13: qty 1.2

## 2019-01-13 MED ORDER — HEPARIN SODIUM (PORCINE) 1000 UNIT/ML IJ SOLN
INTRAMUSCULAR | Status: DC | PRN
  Administered 2019-01-13: 10000 [IU] via INTRAVENOUS

## 2019-01-13 MED ORDER — SERTRALINE HCL 50 MG PO TABS
50.0000 mg | ORAL_TABLET | Freq: Every day | ORAL | Status: DC
  Administered 2019-01-14: 50 mg via ORAL
  Filled 2019-01-13: qty 1

## 2019-01-13 MED ORDER — FLEET ENEMA 7-19 GM/118ML RE ENEM: 1.0000 | ENEMA | Freq: Once | RECTAL | Status: DC | PRN

## 2019-01-13 MED ORDER — PHENYLEPHRINE 40 MCG/ML (10ML) SYRINGE FOR IV PUSH (FOR BLOOD PRESSURE SUPPORT)
PREFILLED_SYRINGE | INTRAVENOUS | Status: DC | PRN
  Administered 2019-01-13: 120 ug via INTRAVENOUS

## 2019-01-13 MED ORDER — ONDANSETRON HCL 4 MG/2ML IJ SOLN: 4.0000 mg | Freq: Four times a day (QID) | INTRAMUSCULAR | Status: DC | PRN

## 2019-01-13 MED ORDER — DOCUSATE SODIUM 100 MG PO CAPS
100.0000 mg | ORAL_CAPSULE | Freq: Every day | ORAL | Status: DC
  Administered 2019-01-14: 100 mg via ORAL
  Filled 2019-01-13: qty 1

## 2019-01-13 MED ORDER — METOPROLOL TARTRATE 5 MG/5ML IV SOLN: 2.0000 mg | INTRAVENOUS | Status: DC | PRN

## 2019-01-13 MED ORDER — ALBUMIN HUMAN 5 % IV SOLN
INTRAVENOUS | Status: DC | PRN
  Administered 2019-01-13: 19:00:00 via INTRAVENOUS

## 2019-01-13 MED ORDER — SODIUM CHLORIDE 0.9 % IV SOLN
INTRAVENOUS | Status: DC | PRN
  Administered 2019-01-13: 500 mL

## 2019-01-13 MED ORDER — PHENOL 1.4 % MT LIQD: 1.0000 | OROMUCOSAL | Status: DC | PRN

## 2019-01-13 MED ORDER — CEFAZOLIN SODIUM-DEXTROSE 2-4 GM/100ML-% IV SOLN
2.0000 g | Freq: Three times a day (TID) | INTRAVENOUS | Status: AC
  Administered 2019-01-14 (×2): 2 g via INTRAVENOUS
  Filled 2019-01-13 (×3): qty 100

## 2019-01-13 MED ORDER — MAGNESIUM SULFATE 2 GM/50ML IV SOLN: 2.0000 g | Freq: Every day | INTRAVENOUS | Status: DC | PRN

## 2019-01-13 MED ORDER — CEFAZOLIN SODIUM-DEXTROSE 2-4 GM/100ML-% IV SOLN
2.0000 g | Freq: Once | INTRAVENOUS | Status: AC
  Administered 2019-01-13: 18:00:00 2 g via INTRAVENOUS

## 2019-01-13 MED ORDER — IOHEXOL 350 MG/ML SOLN
100.0000 mL | Freq: Once | INTRAVENOUS | Status: AC | PRN
  Administered 2019-01-13: 100 mL via INTRAVENOUS

## 2019-01-13 MED ORDER — DEXAMETHASONE SODIUM PHOSPHATE 10 MG/ML IJ SOLN
INTRAMUSCULAR | Status: DC | PRN
  Administered 2019-01-13: 6 mg via INTRAVENOUS
  Administered 2019-01-13: 4 mg via INTRAVENOUS

## 2019-01-13 MED ORDER — LACTATED RINGERS IV SOLN
INTRAVENOUS | Status: DC | PRN
  Administered 2019-01-13 (×2): via INTRAVENOUS

## 2019-01-13 MED ORDER — MEPERIDINE HCL 25 MG/ML IJ SOLN: 6.2500 mg | INTRAMUSCULAR | Status: DC | PRN

## 2019-01-13 MED ORDER — ONDANSETRON HCL 4 MG/2ML IJ SOLN
INTRAMUSCULAR | Status: AC
  Filled 2019-01-13: qty 2

## 2019-01-13 MED ORDER — PROTAMINE SULFATE 10 MG/ML IV SOLN
INTRAVENOUS | Status: DC | PRN
  Administered 2019-01-13: 50 mg via INTRAVENOUS

## 2019-01-13 MED ORDER — THROMBIN (RECOMBINANT) 20000 UNITS EX SOLR
CUTANEOUS | Status: AC
  Filled 2019-01-13: qty 20000

## 2019-01-13 MED ORDER — THROMBIN 20000 UNITS EX SOLR
CUTANEOUS | Status: DC | PRN
  Administered 2019-01-13: 19:00:00 20 mL via TOPICAL

## 2019-01-13 MED ORDER — SUCCINYLCHOLINE CHLORIDE 20 MG/ML IJ SOLN
INTRAMUSCULAR | Status: DC | PRN
  Administered 2019-01-13: 200 mg via INTRAVENOUS

## 2019-01-13 MED ORDER — 0.9 % SODIUM CHLORIDE (POUR BTL) OPTIME
TOPICAL | Status: DC | PRN
  Administered 2019-01-13 (×2): 1000 mL

## 2019-01-13 MED ORDER — HYDROMORPHONE HCL 1 MG/ML IJ SOLN: 0.5000 mg | INTRAMUSCULAR | Status: DC | PRN

## 2019-01-13 MED ORDER — HYDROCODONE-ACETAMINOPHEN 5-325 MG PO TABS
1.0000 | ORAL_TABLET | ORAL | Status: DC | PRN
  Administered 2019-01-14: 2 via ORAL
  Filled 2019-01-13: qty 2

## 2019-01-13 MED ORDER — HYDROMORPHONE HCL 1 MG/ML IJ SOLN: 0.2500 mg | INTRAMUSCULAR | Status: DC | PRN

## 2019-01-13 MED ORDER — PANTOPRAZOLE SODIUM 40 MG PO TBEC
40.0000 mg | DELAYED_RELEASE_TABLET | Freq: Every day | ORAL | Status: DC
  Administered 2019-01-13 – 2019-01-14 (×2): 40 mg via ORAL
  Filled 2019-01-13 (×2): qty 1

## 2019-01-13 MED ORDER — PROPOFOL 10 MG/ML IV BOLUS
INTRAVENOUS | Status: DC | PRN
  Administered 2019-01-13: 50 mg via INTRAVENOUS
  Administered 2019-01-13: 110 mg via INTRAVENOUS

## 2019-01-13 SURGICAL SUPPLY — 50 items
ADH SKN CLS APL DERMABOND .7 (GAUZE/BANDAGES/DRESSINGS) ×1
CANISTER SUCT 3000ML PPV (MISCELLANEOUS) ×2 IMPLANT
CANNULA VESSEL 3MM 2 BLNT TIP (CANNULA) ×3 IMPLANT
CATH EMB 4FR 80CM (CATHETERS) ×1 IMPLANT
CLIP VESOCCLUDE MED 24/CT (CLIP) ×2 IMPLANT
CLIP VESOCCLUDE SM WIDE 24/CT (CLIP) ×2 IMPLANT
COVER WAND RF STERILE (DRAPES) ×2 IMPLANT
DERMABOND ADVANCED (GAUZE/BANDAGES/DRESSINGS) ×1
DERMABOND ADVANCED .7 DNX12 (GAUZE/BANDAGES/DRESSINGS) IMPLANT
DRAIN CHANNEL 15F RND FF W/TCR (WOUND CARE) IMPLANT
ELECT REM PT RETURN 9FT ADLT (ELECTROSURGICAL) ×2
ELECTRODE REM PT RTRN 9FT ADLT (ELECTROSURGICAL) ×1 IMPLANT
EVACUATOR SILICONE 100CC (DRAIN) IMPLANT
GAUZE SPONGE 4X4 16PLY XRAY LF (GAUZE/BANDAGES/DRESSINGS) ×1 IMPLANT
GLOVE BIO SURGEON STRL SZ 6.5 (GLOVE) ×1 IMPLANT
GLOVE BIO SURGEON STRL SZ7.5 (GLOVE) ×2 IMPLANT
GLOVE BIOGEL PI IND STRL 6.5 (GLOVE) IMPLANT
GLOVE BIOGEL PI IND STRL 7.5 (GLOVE) IMPLANT
GLOVE BIOGEL PI IND STRL 8 (GLOVE) ×1 IMPLANT
GLOVE BIOGEL PI INDICATOR 6.5 (GLOVE) ×3
GLOVE BIOGEL PI INDICATOR 7.5 (GLOVE) ×1
GLOVE BIOGEL PI INDICATOR 8 (GLOVE) ×1
GLOVE ECLIPSE 6.5 STRL STRAW (GLOVE) ×1 IMPLANT
GLOVE SURG SS PI 6.5 STRL IVOR (GLOVE) ×1 IMPLANT
GOWN STRL REUS W/ TWL LRG LVL3 (GOWN DISPOSABLE) ×3 IMPLANT
GOWN STRL REUS W/TWL LRG LVL3 (GOWN DISPOSABLE) ×6
GRAFT VASC STRETCH 8X40 (Graft) ×1 IMPLANT
INSERT FOGARTY SM (MISCELLANEOUS) ×1 IMPLANT
KIT BASIN OR (CUSTOM PROCEDURE TRAY) ×2 IMPLANT
KIT TURNOVER KIT B (KITS) ×2 IMPLANT
NS IRRIG 1000ML POUR BTL (IV SOLUTION) ×4 IMPLANT
PACK PERIPHERAL VASCULAR (CUSTOM PROCEDURE TRAY) ×2 IMPLANT
PAD ARMBOARD 7.5X6 YLW CONV (MISCELLANEOUS) ×4 IMPLANT
SPONGE SURGIFOAM ABS GEL 100 (HEMOSTASIS) ×1 IMPLANT
SUT PROLENE 5 0 C 1 24 (SUTURE) ×3 IMPLANT
SUT PROLENE 6 0 BV (SUTURE) ×4 IMPLANT
SUT SILK 2 0 PERMA HAND 18 BK (SUTURE) IMPLANT
SUT SILK 2 0 SH (SUTURE) ×2 IMPLANT
SUT VIC AB 2-0 CT1 27 (SUTURE) ×2
SUT VIC AB 2-0 CT1 TAPERPNT 27 (SUTURE) IMPLANT
SUT VIC AB 2-0 CTB1 (SUTURE) ×3 IMPLANT
SUT VIC AB 3-0 SH 27 (SUTURE) ×4
SUT VIC AB 3-0 SH 27X BRD (SUTURE) ×2 IMPLANT
SUT VIC AB 4-0 PS2 18 (SUTURE) ×1 IMPLANT
SUT VICRYL 4-0 PS2 18IN ABS (SUTURE) ×3 IMPLANT
SYR 3ML LL SCALE MARK (SYRINGE) ×1 IMPLANT
TOWEL GREEN STERILE (TOWEL DISPOSABLE) ×2 IMPLANT
TRAY FOLEY MTR SLVR 16FR STAT (SET/KITS/TRAYS/PACK) ×2 IMPLANT
UNDERPAD 30X30 (UNDERPADS AND DIAPERS) ×2 IMPLANT
WATER STERILE IRR 1000ML POUR (IV SOLUTION) ×2 IMPLANT

## 2019-01-13 NOTE — Anesthesia Preprocedure Evaluation (Signed)
Anesthesia Evaluation  Patient identified by MRN, date of birth, ID band Patient awake    Reviewed: Allergy & Precautions, NPO status , Patient's Chart, lab work & pertinent test results  Airway Mallampati: I  TM Distance: >3 FB Neck ROM: Full    Dental   Pulmonary former smoker,    Pulmonary exam normal        Cardiovascular Normal cardiovascular exam     Neuro/Psych Depression    GI/Hepatic GERD  Controlled and Medicated,  Endo/Other    Renal/GU      Musculoskeletal   Abdominal   Peds  Hematology   Anesthesia Other Findings   Reproductive/Obstetrics                             Anesthesia Physical Anesthesia Plan  ASA: III  Anesthesia Plan: General   Post-op Pain Management:    Induction: Intravenous  PONV Risk Score and Plan: 2 and Ondansetron and Midazolam  Airway Management Planned: Oral ETT  Additional Equipment:   Intra-op Plan:   Post-operative Plan: Extubation in OR  Informed Consent: I have reviewed the patients History and Physical, chart, labs and discussed the procedure including the risks, benefits and alternatives for the proposed anesthesia with the patient or authorized representative who has indicated his/her understanding and acceptance.       Plan Discussed with: CRNA and Surgeon  Anesthesia Plan Comments:         Anesthesia Quick Evaluation

## 2019-01-13 NOTE — ED Notes (Signed)
Pedal pulse on left foot very weak barely palpable, especially as compared to right pedal pulse strong easily palpated and regular.

## 2019-01-13 NOTE — H&P (Signed)
REASON FOR ADMISSION:    Ischemic left lower extremity.  The consult is requested by Dr. Tomi Bamberger.  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  Given that there was bleeding noted at the time of surgery I suspect there was potentially some arterial injury.  Currently he has minimal Doppler flow to the left foot and I have recommended urgent revascularization.  Most likely the safest approach will be a right to left femoral-femoral bypass.  I think local exploration of the artery would be less favorable given that the iliac is fairly deep and may potentially have significant injury.  I have explained that without revascularization this could potentially become a limb threatening problem.  I explained that there is a small chance that he would require fasciotomies if he developed significant swelling after revascularization.  I have discussed the potential complications of surgery including but not limited to bleeding, wound healing problems, and infection.  He is agreeable to proceed urgently.   Deitra Mayo, MD, FACS Beeper [phone] Office: [phone]   HPI:   [name] is a pleasant 73 y.o. male, who underwent a left inguinal radical orchiectomy yesterday.  Of note there was some bleeding noted at the time of surgery which stopped after the cord was clamped.  Initially did not have any significant left leg pain.  However he awoke at midnight with left leg pain which progressed.  Ultimately he went to the [facility] long emergency department and underwent work-up including a CT scan of the pelvis and left lower extremity which shows occlusion of the left external iliac artery from its origin down to the common femoral artery.  He is transferred urgently to [facility] with an ischemic left lower extremity.  Prior to this event, he denied any history of claudication, rest pain,  or nonhealing ulcers.  His risk factors for peripheral vascular disease include a remote history of tobacco use and a family history of premature cardiovascular disease.  He denies any history of diabetes, hypertension, or hypercholesterolemia.  Past Medical History:  Diagnosis Date  . Depression   . GERD (gastroesophageal reflux disease)   . Hyperlipidemia   . Osteopenia     History reviewed. No pertinent family history.  His brother had a myocardial infarction at age 3.  SOCIAL HISTORY: He quit smoking 30 years ago. Social History   Socioeconomic History  . Marital status: Married    Spouse name: Not on file  . Number of children: Not on file  . Years of education: Not on file  . Highest education level: Not on file  Occupational History  . Not on file  Social Needs  . Financial resource strain: Not on file  . Food insecurity:    Worry: Not on file    Inability: Not on file  . Transportation needs:    Medical: Not on file    Non-medical: Not on file  Tobacco Use  . Smoking status: Former Research scientist (life sciences)  . Smokeless tobacco: Never Used  . Tobacco comment: quit 1990's  Substance and Sexual Activity  . Alcohol use: Yes    Alcohol/week: 6.0 standard drinks    Types: 6 Cans of beer per week    Comment: beer occasional  . Drug use: Never  . Sexual activity: Not on file  Lifestyle  . Physical activity:    Days per week: Not on file  Minutes per session: Not on file  . Stress: Not on file  Relationships  . Social connections:    Talks on phone: Not on file    Gets together: Not on file    Attends religious service: Not on file    Active member of club or organization: Not on file    Attends meetings of clubs or organizations: Not on file    Relationship status: Not on file  . Intimate partner violence:    Fear of current or ex partner: Not on file    Emotionally abused: Not on file    Physically abused: Not on file    Forced sexual activity: Not on file  Other  Topics Concern  . Not on file  Social History Narrative  . Not on file    No Known Allergies  Current Facility-Administered Medications  Medication Dose Route Frequency Provider Last Rate Last Dose  . 0.9 %  sodium chloride infusion   Intravenous Continuous Dorie Rank, MD 125 mL/hr at 01/13/19 1335    . ceFAZolin (ANCEF) IVPB 2g/100 mL premix  2 g Intravenous Once Angelia Mould, MD      . sodium chloride (PF) 0.9 % injection             REVIEW OF SYSTEMS:  [X]  denotes positive finding, [ ]  denotes negative finding Cardiac  Comments:  Chest pain or chest pressure:    Shortness of breath upon exertion:    Short of breath when lying flat:    Irregular heart rhythm:        Vascular    Pain in calf, thigh, or hip brought on by ambulation:    Pain in feet at night that wakes you up from your sleep:     Blood clot in your veins:    Leg swelling:         Pulmonary    Oxygen at home:    Productive cough:     Wheezing:         Neurologic    Sudden weakness in arms or legs:     Sudden numbness in arms or legs:  X  left leg  Sudden onset of difficulty speaking or slurred speech:    Temporary loss of vision in one eye:     Problems with dizziness:         Gastrointestinal    Blood in stool:     Vomited blood:         Genitourinary    Burning when urinating:     Blood in urine:        Psychiatric    Major depression:         Hematologic    Bleeding problems:    Problems with blood clotting too easily:        Skin    Rashes or ulcers:        Constitutional    Fever or chills:     PHYSICAL EXAM:   Vitals:   01/13/19 1155 01/13/19 1335 01/13/19 1546 01/13/19 1715  BP: 128/78 132/82 138/88   Pulse: 97 88 78   Resp: 14 16 16    Temp: 98.3 F (36.8 C)     TempSrc: Oral     SpO2: 95% 96% 96%   Weight:    96.8 kg  Height:    6' 0.01" (1.829 m)    GENERAL: The patient is a well-nourished male, in no acute distress. The vital signs are documented above.  CARDIAC: There is a regular rate and rhythm.  VASCULAR: I do not detect carotid bruits. On the left side, which is the symptomatic side, I cannot palpate a femoral pulse.  He has a barely audible posterior tibial signal.  There is no peroneal or dorsalis pedis signal. On the right side he has a palpable femoral, popliteal, posterior tibial, and dorsalis pedis pulse. There is no significant lower extremity swelling. PULMONARY: There is good air exchange bilaterally without wheezing or rales. ABDOMEN: Soft and non-tender with normal pitched bowel sounds.  MUSCULOSKELETAL: There are no major deformities or cyanosis. NEUROLOGIC: He has decreased sensation to the left foot. SKIN: There are no ulcers or rashes noted. PSYCHIATRIC: The patient has a normal affect.  DATA:    CT PELVIS AND LEFT LOWER EXTREMITY: This shows occlusion of the external iliac artery proximally with reconstitution of the distal common femoral artery.  This was not a dedicated CT angiogram.  LABS: Creatinine is 0.99.  GFR is greater than 60.  Hemoglobin is 12.6.  Hematocrit 38.5.  Platelets 204,000.  White blood cell count 11.3.

## 2019-01-13 NOTE — Anesthesia Postprocedure Evaluation (Signed)
Anesthesia Post Note  Patient: [name]  Procedure(s) Performed: LEFT INGUINAL RADICAL ORCHIECTOMY (Left )     Patient location during evaluation: PACU Anesthesia Type: General Level of consciousness: awake and alert, awake and oriented Pain management: pain level controlled Vital Signs Assessment: post-procedure vital signs reviewed and stable Respiratory status: spontaneous breathing, nonlabored ventilation, respiratory function stable and patient connected to nasal cannula oxygen Cardiovascular status: blood pressure returned to baseline and stable Postop Assessment: no apparent nausea or vomiting Anesthetic complications: no    Last Vitals:  Vitals:   01/12/19 1605 01/12/19 1708  BP:  120/78  Pulse:  78  Resp:  14  Temp:  (!) 36.3 C  SpO2: 93% 98%    Last Pain:  Vitals:   01/13/19 1020  TempSrc:   PainSc: 5    Pain Goal:                   Catalina Gravel

## 2019-01-13 NOTE — ED Provider Notes (Signed)
[place] DEPT Provider Note   CSN: [id number] Arrival date & time: 01/13/19  1149    History   Chief Complaint Chief Complaint  Patient presents with   Leg Pain    HPI [name] is a 73 y.o. male.     HPI Patient presents to the emergency room for evaluation of left leg numbness.  And had surgery yesterday by Dr. Gloriann Loan.  He had a left inguinal radical orchiectomy.  Patient states this morning when he woke up he started experiencing numbness associated with coldness in his left leg.  Patient feels like his thigh is numb.  His foot also feels very cold.  He denies any trouble with chest pain or shortness of breath.  He called the urologist who instructed him to come to the ED. Past Medical History:  Diagnosis Date   Depression    GERD (gastroesophageal reflux disease)    Hyperlipidemia    Osteopenia     Patient Active Problem List   Diagnosis Date Noted   Right knee pain 03/02/2015    Past Surgical History:  Procedure Laterality Date   LUMBAR LAMINECTOMY     ORCHIECTOMY Left 01/12/2019   Procedure: LEFT INGUINAL RADICAL ORCHIECTOMY;  Surgeon: Lucas Mallow, MD;  Location: [facility];  Service: Urology;  Laterality: Left;   ROTATOR CUFF REPAIR Right    UMBILICAL HERNIA REPAIR          Home Medications    Prior to Admission medications   Medication Sig Start Date End Date Taking? Authorizing Provider  Cholecalciferol (VITAMIN D3) 1.25 MG (50000 UT) TABS Take 1 tablet by mouth at bedtime.   Yes [provider]  HYDROcodone-acetaminophen (NORCO/VICODIN) 5-325 MG tablet Take 1 tablet by mouth every 4 (four) hours as needed for moderate pain. 01/12/19 01/12/20 Yes Marton Redwood III, MD  loratadine (CLARITIN) 10 MG tablet Take 10 mg by mouth daily. Patient used this medication for his allergies.   Yes [provider]  omeprazole (PRILOSEC) 20 MG capsule Take 20 mg by mouth daily.  02/21/15   Yes [provider]  sertraline (ZOLOFT) 50 MG tablet Take 50 mg by mouth daily.   Yes [provider]    Family History History reviewed. No pertinent family history.  Social History Social History   Tobacco Use   Smoking status: Former Smoker   Smokeless tobacco: Never Used   Tobacco comment: quit 1990's  Substance Use Topics   Alcohol use: Yes    Alcohol/week: 6.0 standard drinks    Types: 6 Cans of beer per week    Comment: beer occasional   Drug use: Never     Allergies   Patient has no known allergies.   Review of Systems Review of Systems  All other systems reviewed and are negative.    Physical Exam Updated Vital Signs BP 138/88    Pulse 78    Temp 98.3 F (36.8 C) (Oral)    Resp 16    SpO2 96%   Physical Exam Vitals signs and nursing note reviewed.  Constitutional:      General: He is not in acute distress.    Appearance: He is well-developed.  HENT:     Head: Normocephalic and atraumatic.     Right Ear: External ear normal.     Left Ear: External ear normal.  Eyes:     General: No scleral icterus.       Right eye: No  discharge.        Left eye: No discharge.     Conjunctiva/sclera: Conjunctivae normal.  Neck:     Musculoskeletal: Neck supple.     Trachea: No tracheal deviation.  Cardiovascular:     Rate and Rhythm: Normal rate and regular rhythm.  Pulmonary:     Effort: Pulmonary effort is normal. No respiratory distress.     Breath sounds: Normal breath sounds. No stridor. No wheezing or rales.  Abdominal:     General: Bowel sounds are normal. There is no distension.     Palpations: Abdomen is soft.     Tenderness: There is no abdominal tenderness. There is no guarding or rebound.     Comments: Left inguinal incision without erythema, some surrounding ecchymoses and edema  Musculoskeletal:        General: No tenderness.     Comments: Unable to palpate exterior tibial or dorsalis pedis on the left side, foot is colder  to the touch than the right foot, cap refill is diminished, no cyanosis noted  Skin:    General: Skin is warm and dry.     Findings: No rash.  Neurological:     Mental Status: He is alert.     Cranial Nerves: No cranial nerve deficit (no facial droop, extraocular movements intact, no slurred speech).     Sensory: No sensory deficit.     Motor: No abnormal muscle tone or seizure activity.     Coordination: Coordination normal.      ED Treatments / Results  Labs (all labs ordered are listed, but only abnormal results are displayed) Labs Reviewed  CBC - Abnormal; Notable for the following components:      Result Value   WBC 11.3 (*)    RBC 3.89 (*)    Hemoglobin 12.6 (*)    HCT 38.5 (*)    All other components within normal limits  SARS CORONAVIRUS 2 (HOSPITAL ORDER, [facility])  BASIC METABOLIC PANEL    EKG None  Radiology Ct Angio Low Extrem Left W &/or Wo Contrast  Result Date: 01/13/2019 CLINICAL DATA:  73 year old male with left lower extremity pain, numbness and coldness following left inguinal radical orchiectomy performed yesterday. EXAM: CT ANGIOGRAPHY OF THE left lowerEXTREMITY TECHNIQUE: Multidetector CT imaging of the left lowerwas performed using the standard protocol during bolus administration of intravenous contrast. Multiplanar CT image reconstructions and MIPs were obtained to evaluate the vascular anatomy. CONTRAST:  151mL OMNIPAQUE IOHEXOL 350 MG/ML SOLN COMPARISON:  None. FINDINGS: Inflow: Mild heterogeneous but largely calcified atherosclerotic plaque without evidence of stenosis or occlusion in the common iliac artery. The left internal iliac artery is widely patent. The left external iliac artery is completely occluded approximately 1.5 cm beyond the origin. Outflow: The common femoral artery is also occluded but reconstitutes just proximal to the bifurcation. The profunda femoral branches are patent. The superficial femoral and  popliteal artery are widely patent. Runoff: Patent to the ankle. Review of the MIP images confirms the above findings. Nonvascular: Expected postoperative changes in the left inguinal region consistent with the reported history of recent left orchiectomy. There is subcutaneous emphysema and a small amount of intermediate attenuation material in the lateral inguinal recess measuring approximately 2.3 x 1.6 cm likely representing focal hematoma. Expected subcutaneous emphysema within the left hemiscrotum and along the spermatic cord. Incompletely imaged lower lumbar degenerative disc disease. IMPRESSION: 1. Positive for acute appearing occlusion of the left external iliac artery extending into the  left common femoral artery with reconstitution just cephalad to the common femoral bifurcation. Suspect underlying dissection. 2. Additional expected postoperative changes with a very small amount of hematoma in the left lateral inguinal recess. 3. Mild calcified atherosclerotic plaque without evidence of stenosis. These results were called by telephone at the time of interpretation on 01/13/2019 at 3:35 pm to Dr. Dorie Rank , who verbally acknowledged these results. Electronically Signed   By: Jacqulynn Cadet M.D.   On: 01/13/2019 15:36    Procedures .Critical Care Performed by: Dorie Rank, MD Authorized by: Dorie Rank, MD   Critical care provider statement:    Critical care time (minutes):  45   Critical care was time spent personally by me on the following activities:  Discussions with consultants, evaluation of patient's response to treatment, examination of patient, ordering and performing treatments and interventions, ordering and review of laboratory studies, ordering and review of radiographic studies, pulse oximetry, re-evaluation of patient's condition, obtaining history from patient or surrogate and review of old charts   (including critical care time)  Medications Ordered in ED Medications  0.9 %   sodium chloride infusion ( Intravenous New Bag/Given 01/13/19 1335)  sodium chloride (PF) 0.9 % injection (has no administration in time range)  iohexol (OMNIPAQUE) 350 MG/ML injection 100 mL (100 mLs Intravenous Contrast Given 01/13/19 1447)     Initial Impression / Assessment and Plan / ED Course  I have reviewed the triage vital signs and the nursing notes.  Pertinent labs & imaging results that were available during my care of the patient were reviewed by me and considered in my medical decision making (see chart for details).  Clinical Course as of Jan 13 1548  Tue Jan 13, 2019  1424 Discussed with Dr Tresa Moore.  Will come down to assess.  Will proceed with ct angio in the meantime instead of the arterial US   [JK]  1545 DIscussed with Dr Doren Custard.  Recc emergent transfer to [facility].   [JK]    Clinical Course User Index [JK] Dorie Rank, MD     Patient presented to the emergency room with complaints of left leg numbness and coolness.  He had inguinal surgery yesterday.  Concern was some type of complication affecting his groin or femoral artery after the procedure.  On exam the patient did have decreased pulses.  CT angios was performed that does confirm an occlusion of the left external iliac artery.  Spoke with Dr. Doren Custard and the plan is to have him emergently transferred to [facility].  I will start heparin.  Patient will remain n.p.o.  I notified Dr. Darl Householder in the ED of the patient's planned arrival.  Final Clinical Impressions(s) / ED Diagnoses   Final diagnoses:  External iliac artery occlusion [facility])       Dorie Rank, MD 01/13/19 1551

## 2019-01-13 NOTE — Anesthesia Procedure Notes (Signed)
Procedure Name: Intubation Performed by: Milford Cage, CRNA Pre-anesthesia Checklist: Patient identified, Emergency Drugs available, Suction available and Patient being monitored Patient Re-evaluated:Patient Re-evaluated prior to induction Oxygen Delivery Method: Circle System Utilized Preoxygenation: Pre-oxygenation with 100% oxygen Induction Type: IV induction and Rapid sequence Ventilation: Mask ventilation without difficulty Laryngoscope Size: Mac and 4 Grade View: Grade II Tube type: Oral Number of attempts: 1 Airway Equipment and Method: Stylet and Oral airway Placement Confirmation: ETT inserted through vocal cords under direct vision,  positive ETCO2 and breath sounds checked- equal and bilateral Secured at: 21 cm Tube secured with: Tape Dental Injury: Teeth and Oropharynx as per pre-operative assessment

## 2019-01-13 NOTE — ED Triage Notes (Signed)
Patient had a left inguinal radical orchiectomy yesterday with Dr. Phillis Knack. Patient is now complaining of left pain from groin to his lower leg with thigh numbness. Patient states his foot is cold and concerned about blood flow. Denies chest pain or shortness of breath.

## 2019-01-13 NOTE — Transfer of Care (Signed)
Immediate Anesthesia Transfer of Care Note  Patient: [name]  Procedure(s) Performed: BYPASS GRAFT FEMORAL-FEMORAL ARTERY (Left Groin)  Patient Location: PACU  Anesthesia Type:General  Level of Consciousness: awake, oriented, drowsy and patient cooperative  Airway & Oxygen Therapy: Patient Spontanous Breathing and Patient connected to nasal cannula oxygen  Post-op Assessment: Report given to RN, Post -op Vital signs reviewed and stable and Patient moving all extremities X 4  Post vital signs: Reviewed and stable  Last Vitals:  Vitals Value Taken Time  BP 131/71 01/13/2019  7:53 PM  Temp 36.4 C 01/13/2019  7:51 PM  Pulse 95 01/13/2019  7:57 PM  Resp 20 01/13/2019  7:57 PM  SpO2 99 % 01/13/2019  7:57 PM  Vitals shown include unvalidated device data.  Last Pain:  Vitals:   01/13/19 1157  TempSrc:   PainSc: 4          Complications: No apparent anesthesia complications

## 2019-01-13 NOTE — Consult Note (Signed)
Reason for Consult: Left Testicular Neoplasm v. Infarction, Left External Iliac / Femoral Occlusion After Orchiectomy  Referring Physician: Marye Round MD  [name] is an 73 y.o. male.   HPI:   1 - Left Testicular Neoplasm v. Infarction - s/p left radical inguinal approach orhiectomy 01/12/2019 by Dr. Gloriann Loan for left testicular neoplasm v. Infarct (path pending).   2 - Left External Iliac / Femoral Occlusion After Orchiectomy - new left leg pain about 8 hours after left inguinal orchiectomy. Improved some with pain meds. Noticed left leg "felt cold" and urgently advised by [place] to present to ER where CT-Angiogram confirms occlusion of left ext-iliac-femoral area.   PMH sig for back surgery, UA, Umbilical Hernia repair. No ischemic CV disease / blood thinners.   Today "[name]" is seen for urgent evaluation of above. Last meal 10 AM few bites of pancake.   Past Medical History:  Diagnosis Date  . Depression   . GERD (gastroesophageal reflux disease)   . Hyperlipidemia   . Osteopenia     Past Surgical History:  Procedure Laterality Date  . LUMBAR LAMINECTOMY    . ORCHIECTOMY Left 01/12/2019   Procedure: LEFT INGUINAL RADICAL ORCHIECTOMY;  Surgeon: Lucas Mallow, MD;  Location: [facility];  Service: Urology;  Laterality: Left;  . ROTATOR CUFF REPAIR Right   . UMBILICAL HERNIA REPAIR      History reviewed. No pertinent family history.  Social History:  reports that he has quit smoking. He has never used smokeless tobacco. He reports current alcohol use of about 6.0 standard drinks of alcohol per week. He reports that he does not use drugs.  Allergies: No Known Allergies  Medications: I have reviewed the patient's current medications.  Results for orders placed or performed during the hospital encounter of 01/13/19 (from the past 48 hour(s))  CBC     Status: Abnormal   Collection Time: 01/13/19  1:35 PM  Result Value Ref Range   WBC 11.3 (H) 4.0 - 10.5 K/uL    RBC 3.89 (L) 4.22 - 5.81 MIL/uL   Hemoglobin 12.6 (L) 13.0 - 17.0 g/dL   HCT 38.5 (L) 39.0 - 52.0 %   MCV 99.0 80.0 - 100.0 fL   MCH 32.4 26.0 - 34.0 pg   MCHC 32.7 30.0 - 36.0 g/dL   RDW 13.4 11.5 - 15.5 %   Platelets 204 150 - 400 K/uL   nRBC 0.0 0.0 - 0.2 %    Comment: Performed at [facility], [place] [address] [place], [place] [address]  Basic metabolic panel     Status: None   Collection Time: 01/13/19  1:35 PM  Result Value Ref Range   Sodium 139 135 - 145 mmol/L   Potassium 4.0 3.5 - 5.1 mmol/L   Chloride 103 98 - 111 mmol/L   CO2 28 22 - 32 mmol/L   Glucose, Bld 88 70 - 99 mg/dL   BUN 14 8 - 23 mg/dL   Creatinine, Ser 0.99 0.61 - 1.24 mg/dL   Calcium 8.9 8.9 - 10.3 mg/dL   GFR calc non Af Amer >60 >60 mL/min   GFR calc Af Amer >60 >60 mL/min   Anion gap 8 5 - 15    Comment: Performed at [facility], [place] [address], [place], [place] [address]    Ct Angio Low Extrem Left W &/or Wo Contrast  Result Date: 01/13/2019 CLINICAL DATA:  73 year old male with left lower extremity pain, numbness  and coldness following left inguinal radical orchiectomy performed yesterday. EXAM: CT ANGIOGRAPHY OF THE left lowerEXTREMITY TECHNIQUE: Multidetector CT imaging of the left lowerwas performed using the standard protocol during bolus administration of intravenous contrast. Multiplanar CT image reconstructions and MIPs were obtained to evaluate the vascular anatomy. CONTRAST:  142mL OMNIPAQUE IOHEXOL 350 MG/ML SOLN COMPARISON:  None. FINDINGS: Inflow: Mild heterogeneous but largely calcified atherosclerotic plaque without evidence of stenosis or occlusion in the common iliac artery. The left internal iliac artery is widely patent. The left external iliac artery is completely occluded approximately 1.5 cm beyond the origin. Outflow: The common femoral artery is also occluded but reconstitutes just proximal to the bifurcation. The profunda femoral branches are  patent. The superficial femoral and popliteal artery are widely patent. Runoff: Patent to the ankle. Review of the MIP images confirms the above findings. Nonvascular: Expected postoperative changes in the left inguinal region consistent with the reported history of recent left orchiectomy. There is subcutaneous emphysema and a small amount of intermediate attenuation material in the lateral inguinal recess measuring approximately 2.3 x 1.6 cm likely representing focal hematoma. Expected subcutaneous emphysema within the left hemiscrotum and along the spermatic cord. Incompletely imaged lower lumbar degenerative disc disease. IMPRESSION: 1. Positive for acute appearing occlusion of the left external iliac artery extending into the left common femoral artery with reconstitution just cephalad to the common femoral bifurcation. Suspect underlying dissection. 2. Additional expected postoperative changes with a very small amount of hematoma in the left lateral inguinal recess. 3. Mild calcified atherosclerotic plaque without evidence of stenosis. These results were called by telephone at the time of interpretation on 01/13/2019 at 3:35 pm to Dr. Dorie Rank , who verbally acknowledged these results. Electronically Signed   By: Jacqulynn Cadet M.D.   On: 01/13/2019 15:36    Review of Systems  Constitutional: Negative.  Negative for chills and fever.  HENT: Negative.   Eyes: Negative.   Respiratory: Negative.   Cardiovascular: Negative for chest pain and palpitations.  Gastrointestinal: Negative.   Genitourinary: Negative.   Musculoskeletal: Negative.   Skin: Negative.   Neurological: Negative.   Endo/Heme/Allergies: Negative.   Psychiatric/Behavioral: Negative.    Blood pressure 138/88, pulse 78, temperature 98.3 F (36.8 C), temperature source Oral, resp. rate 16, SpO2 96 %. Physical Exam  Constitutional: He is oriented to person, place, and time. He appears well-developed.  Not in distress in [facility] ER.    HENT:  Head: Normocephalic.  Neck: Normal range of motion.  Cardiovascular: Normal rate.  Respiratory: Effort normal.  GI: Soft.  Genitourinary:    Genitourinary Comments: Recent left inguinal incision with mild ecchymoses as expected No palpable hematomas. NO scrtoal hematomas. Palpablely normal solitary Rt testis.    Musculoskeletal:     Comments: LLE cool, but not cold / cyanotic.   Neurological: He is alert and oriented to person, place, and time.  Skin: Skin is warm.  Psychiatric: He has a normal mood and affect.    Assessment/Plan:  1 - Left Testicular Neoplasm v. Infarction - path pending. No further directed treatment at this time.   2 - Left External Iliac / Femoral Occlusion After Orchiectomy - Agree with urgent vascular surgery opinion. Briefly overviewed potential management options with patient, but certainly defer to vasc surg expertise. Should open exploration be warranted I am happy to be a part of procedure.   Alexis Frock 01/13/2019, 4:23 PM

## 2019-01-13 NOTE — Op Note (Signed)
NAME: [name]    MRN: [id number] DOB: Apr 20, 1946    DATE OF OPERATION: 01/13/2019  PREOP DIAGNOSIS:    Ischemic left lower extremity with occluded left external iliac artery  POSTOP DIAGNOSIS:    Same  PROCEDURE:    1.  Right to left femoral-femoral bypass (8 mm PTFE graft) 2.  Oversewing of left external iliac artery  SURGEON: Judeth Cornfield. Scot Dock, MD, FACS  ASSIST: Laurence Slate, PA  ANESTHESIA: General  EBL: Minimal  INDICATIONS:    [name] is a 73 y.o. male who developed an ischemic left lower extremity.  On CT he was found to have an occluded left external iliac artery.  He had undergone radical orchiectomy yesterday.  FINDINGS:   The external iliac artery was occluded with no flow.  He had an excellent right femoral pulse and underwent right to left femorofemoral bypass graft.  He had brisk anterior tibial and peroneal signal with the Doppler at the completion.  TECHNIQUE:   The patient was taken to the operating room and received a general anesthetic.  The lower abdomen and entire left lower extremity and right groin were prepped and draped in usual sterile fashion.  A longitudinal incision was made in the right groin and the dissection carried down to the common femoral artery.  There was some hematoma around this.  I dissected out the common femoral, deep femoral, and superficial femoral arteries.  A tunnel was started from the left groin.  On the right side a longitudinal incision was made over the femoral artery.  Dissection was carried down to the common femoral artery.  The common femoral, deep femoral, and superficial femoral arteries were dissected free.  An 8 mm PTFE graft was tunneled between the 2 incisions in an inverted U fashion.  The patient was then heparinized.  Attention was first turned to the right femoral anastomosis.  The common femoral, deep femoral, and superficial femoral arteries were controlled.  A longitudinal arteriotomy  was made in the common femoral artery.  The graft was spatulated and sewn into side to the common femoral artery using continuous 6-0 Prolene suture.  The graft sent for the appropriate length for anastomosis to the left common femoral artery.  As I plan to ligate the external iliac artery I felt the graft would lie nicer if I did an end to end anastomosis to the distal common femoral artery.  The external iliac artery was divided.  The artery was slightly spatulated distally.  The left limb of the femorofemoral graft was spatulated and cut to the appropriate length and sewn into end to the distal common femoral artery using continuous 6-0 Prolene suture.  Prior to completing this anastomosis the artery was backbled and flushed appropriately.  I did gently pass a 4 Fogarty down the SFA to be sure there was no clot and there was none.  The anastomosis was completed.  At the completion there was a good Doppler signal in the anterior tibial and peroneal position on the right.  The external iliac artery which had been clamped with oversewn with a 5-0 Prolene suture.  Of note without the clamp on there was no flow through the external iliac artery.  Hemostasis was obtained in the wounds.  The wounds were each closed with a deep layer of 2-0 Vicryl, a subcutaneous layer with 2-0 Vicryl and the skin closed with 4-0 Vicryl.  Dermabond was applied.  The patient tolerated the procedure well was transferred to  the recovery room in stable condition.  All needle and sponge counts were correct.  Deitra Mayo, MD, FACS [facility]  DATE OF DICTATION:   01/13/2019

## 2019-01-13 NOTE — ED Notes (Signed)
ED TO INPATIENT HANDOFF REPORT  Name/Age/Gender [name] 73 y.o. male  Code Status   Home/SNF/Other Home  Chief Complaint Occluded Illiac artery  Level of Care/Admitting Diagnosis ED Disposition    ED Disposition Condition Comment   Transfer to Another Facility  The patient appears reasonably stabilized for transfer considering the current resources, flow, and capabilities available in the ED at this time, and I doubt any other [facility] requiring further screening and/or treatment in the ED prior to transfer is p resent.       Medical History Past Medical History:  Diagnosis Date  . Depression   . GERD (gastroesophageal reflux disease)   . Hyperlipidemia   . Osteopenia     Allergies No Known Allergies  IV Location/Drains/Wounds Patient Lines/Drains/Airways Status   Active Line/Drains/Airways    Name:   Placement date:   Placement time:   Site:   Days:   Peripheral IV 01/13/19 Left Antecubital   01/13/19    1334    Antecubital   less than 1   Incision (Closed) 01/12/19 Groin Left   01/12/19    1310     1   Incision (Closed) 01/12/19 Scrotum Other (Comment)   01/12/19    1440     1          Labs/Imaging Results for orders placed or performed during the hospital encounter of 01/13/19 (from the past 48 hour(s))  CBC     Status: Abnormal   Collection Time: 01/13/19  1:35 PM  Result Value Ref Range   WBC 11.3 (H) 4.0 - 10.5 K/uL   RBC 3.89 (L) 4.22 - 5.81 MIL/uL   Hemoglobin 12.6 (L) 13.0 - 17.0 g/dL   HCT 38.5 (L) 39.0 - 52.0 %   MCV 99.0 80.0 - 100.0 fL   MCH 32.4 26.0 - 34.0 pg   MCHC 32.7 30.0 - 36.0 g/dL   RDW 13.4 11.5 - 15.5 %   Platelets 204 150 - 400 K/uL   nRBC 0.0 0.0 - 0.2 %    Comment: Performed at [facility], [place] [address], [place], [place] [address]  Basic metabolic panel     Status: None   Collection Time: 01/13/19  1:35 PM  Result Value Ref Range   Sodium 139 135 - 145 mmol/L   Potassium 4.0 3.5 - 5.1 mmol/L    Chloride 103 98 - 111 mmol/L   CO2 28 22 - 32 mmol/L   Glucose, Bld 88 70 - 99 mg/dL   BUN 14 8 - 23 mg/dL   Creatinine, Ser 0.99 0.61 - 1.24 mg/dL   Calcium 8.9 8.9 - 10.3 mg/dL   GFR calc non Af Amer >60 >60 mL/min   GFR calc Af Amer >60 >60 mL/min   Anion gap 8 5 - 15    Comment: Performed at [facility], [place] [address] [place], [place] [address]   Ct Angio Low Extrem Left W &/or Wo Contrast  Result Date: 01/13/2019 CLINICAL DATA:  73 year old male with left lower extremity pain, numbness and coldness following left inguinal radical orchiectomy performed yesterday. EXAM: CT ANGIOGRAPHY OF THE left lowerEXTREMITY TECHNIQUE: Multidetector CT imaging of the left lowerwas performed using the standard protocol during bolus administration of intravenous contrast. Multiplanar CT image reconstructions and MIPs were obtained to evaluate the vascular anatomy. CONTRAST:  156mL OMNIPAQUE IOHEXOL 350 MG/ML SOLN COMPARISON:  None. FINDINGS: Inflow: Mild heterogeneous but largely calcified atherosclerotic plaque without evidence of stenosis or  occlusion in the common iliac artery. The left internal iliac artery is widely patent. The left external iliac artery is completely occluded approximately 1.5 cm beyond the origin. Outflow: The common femoral artery is also occluded but reconstitutes just proximal to the bifurcation. The profunda femoral branches are patent. The superficial femoral and popliteal artery are widely patent. Runoff: Patent to the ankle. Review of the MIP images confirms the above findings. Nonvascular: Expected postoperative changes in the left inguinal region consistent with the reported history of recent left orchiectomy. There is subcutaneous emphysema and a small amount of intermediate attenuation material in the lateral inguinal recess measuring approximately 2.3 x 1.6 cm likely representing focal hematoma. Expected subcutaneous emphysema within the left hemiscrotum and  along the spermatic cord. Incompletely imaged lower lumbar degenerative disc disease. IMPRESSION: 1. Positive for acute appearing occlusion of the left external iliac artery extending into the left common femoral artery with reconstitution just cephalad to the common femoral bifurcation. Suspect underlying dissection. 2. Additional expected postoperative changes with a very small amount of hematoma in the left lateral inguinal recess. 3. Mild calcified atherosclerotic plaque without evidence of stenosis. These results were called by telephone at the time of interpretation on 01/13/2019 at 3:35 pm to Dr. Dorie Rank , who verbally acknowledged these results. Electronically Signed   By: Jacqulynn Cadet M.D.   On: 01/13/2019 15:36    Pending Labs Unresulted Labs (From admission, onward)    Start     Ordered   01/13/19 1538  SARS Coronavirus 2 (CEPHEID - Performed in [place] hospital lab), Hosp Order  (Asymptomatic Patients Labs)  Once,   R    Question:  Rule Out  Answer:  Yes   01/13/19 1537          Vitals/Pain Today's Vitals   01/13/19 1155 01/13/19 1157 01/13/19 1335 01/13/19 1546  BP: 128/78  132/82 138/88  Pulse: 97  88 78  Resp: 14  16 16   Temp: 98.3 F (36.8 C)     TempSrc: Oral     SpO2: 95%  96% 96%  PainSc:  4       Isolation Precautions No active isolations  Medications Medications  0.9 %  sodium chloride infusion ( Intravenous New Bag/Given 01/13/19 1335)  sodium chloride (PF) 0.9 % injection (has no administration in time range)  iohexol (OMNIPAQUE) 350 MG/ML injection 100 mL (100 mLs Intravenous Contrast Given 01/13/19 1447)    Mobility walks

## 2019-01-13 NOTE — Anesthesia Postprocedure Evaluation (Signed)
Anesthesia Post Note  Patient: [name]  Procedure(s) Performed: BYPASS GRAFT FEMORAL-FEMORAL ARTERY (Left Groin)     Patient location during evaluation: PACU Anesthesia Type: General Level of consciousness: sedated Pain management: pain level controlled Vital Signs Assessment: post-procedure vital signs reviewed and stable Respiratory status: spontaneous breathing and respiratory function stable Cardiovascular status: stable Postop Assessment: no apparent nausea or vomiting Anesthetic complications: no    Last Vitals:  Vitals:   01/13/19 2000 01/13/19 2015  BP: 129/73 118/73  Pulse: 98 96  Resp: 13 18  Temp:  (!) 36.4 C  SpO2: 98% 99%    Last Pain:  Vitals:   01/13/19 2015  TempSrc:   PainSc: 0-No pain                 Mathius Birkeland DANIEL

## 2019-01-13 NOTE — Progress Notes (Signed)
Pharmacy Brief Note:  Pharmacy consult received to initiate heparin drip in [facility] ED.   Patient to be emergently transferred to [facility]. Paged vascular surgeon on call Dr. Scot Dock to inquire about initial bolus for heparin drip given plans for possible surgical intervention. Per MD, discontinue heparin consult at this time.   Lenis Noon, PharmD 01/13/19 4:34 PM

## 2019-01-14 ENCOUNTER — Inpatient Hospital Stay (HOSPITAL_COMMUNITY): Payer: Medicare Other

## 2019-01-14 ENCOUNTER — Encounter (HOSPITAL_COMMUNITY): Payer: Self-pay | Admitting: Vascular Surgery

## 2019-01-14 DIAGNOSIS — I745 Embolism and thrombosis of iliac artery: Secondary | ICD-10-CM

## 2019-01-14 LAB — BASIC METABOLIC PANEL
Anion gap: 7 (ref 5–15)
BUN: 11 mg/dL (ref 8–23)
CO2: 26 mmol/L (ref 22–32)
Calcium: 8.8 mg/dL — ABNORMAL LOW (ref 8.9–10.3)
Chloride: 102 mmol/L (ref 98–111)
Creatinine, Ser: 1.03 mg/dL (ref 0.61–1.24)
GFR calc Af Amer: >60 mL/min (ref >60)
GFR calc non Af Amer: >60 mL/min (ref >60)
Glucose, Bld: 146 mg/dL — ABNORMAL HIGH (ref 70–99)
Potassium: 4.4 mmol/L (ref 3.5–5.1)
Sodium: 135 mmol/L (ref 135–145)

## 2019-01-14 LAB — ABO/RH: ABO/RH(D): A POS

## 2019-01-14 MED ORDER — ASPIRIN EC 81 MG PO TBEC
81.0000 mg | DELAYED_RELEASE_TABLET | Freq: Every day | ORAL | Status: DC
  Administered 2019-01-14: 81 mg via ORAL
  Filled 2019-01-14: qty 1

## 2019-01-14 MED ORDER — ATORVASTATIN CALCIUM 10 MG PO TABS: 10.0000 mg | ORAL_TABLET | Freq: Every day | ORAL | Status: DC

## 2019-01-14 NOTE — Progress Notes (Signed)
Bilateral Post Operative ABIs completed. Preliminary results in Chart review CV Proc. [place] Burr Soffer,RVS 01/14/2019, 2:10 PM

## 2019-01-14 NOTE — Progress Notes (Signed)
Patient has voided x2, patient given discharge instructions, medication list and follow up appointments. Patient verbalized understanding, all questions answered. Patient IV  And tele dcd. Post op ABI being completed and then will discharge home as ordered. Min Colly[place], Bettina Gavia rN

## 2019-01-14 NOTE — Evaluation (Signed)
Physical Therapy Evaluation and D/C Patient Details Name: [name] MRN: [id number] DOB: 1946-07-24 Today's Date: 01/14/2019   History of Present Illness  Pt is a 73 y.o. male who developed an ischemic left lower extremity.  On CT he was found to have an occluded left external iliac artery.  He had undergone radical orchiectomy 6/1. He is now s/p left bypass graft femoral-femoral artery.   Clinical Impression  Pt admitted with above diagnosis. Pt currently without significant functional limitations at this time.  Able to ambulate without device with challenges and no LOB.  Will not need skilled PT at this time and has no equipment needs. Will sign off.    Follow Up Recommendations No PT follow up    Equipment Recommendations  None recommended by PT    Recommendations for Other Services       Precautions / Restrictions Precautions Precautions: None Restrictions Weight Bearing Restrictions: No      Mobility  Bed Mobility               General bed mobility comments: OOB in recliner at beginning and end of session  Transfers Overall transfer level: Modified independent Equipment used: None             General transfer comment: increased time, no physical assist needed  Ambulation/Gait Ambulation/Gait assistance: Independent Gait Distance (Feet): 450 Feet Assistive device: None Gait Pattern/deviations: WFL(Within Functional Limits);Antalgic   Gait velocity interpretation: >2.62 ft/sec, indicative of community ambulatory General Gait Details: Pt was able to ambulate without device with independence. No LOB with challenges.    Stairs Stairs: Yes Stairs assistance: Modified independent (Device/Increase time) Stair Management: One rail Right;Alternating pattern;Forwards Number of Stairs: 2 General stair comments: No problems with steps.   Wheelchair Mobility    Modified Rankin (Stroke Patients Only)       Balance Overall balance assessment:  Independent                                           Pertinent Vitals/Pain Pain Assessment: Faces Pain Score: 2  Faces Pain Scale: Hurts little more Pain Location: incision site Pain Descriptors / Indicators: Burning Pain Intervention(s): Limited activity within patient's tolerance;Monitored during session;Repositioned    Home Living Family/patient expects to be discharged to:: Private residence Living Arrangements: Spouse/significant other Available Help at Discharge: Family;Available 24 hours/day Type of Home: House Home Access: Stairs to enter   [employer] of Steps: 2 Home Layout: One level Home Equipment: Shower seat;Grab bars - toilet;Grab bars - tub/shower;Hand held shower head      Prior Function Level of Independence: Independent               Hand Dominance   Dominant Hand: Right    Extremity/Trunk Assessment   Upper Extremity Assessment Upper Extremity Assessment: Defer to OT evaluation    Lower Extremity Assessment Lower Extremity Assessment: Generalized weakness    Cervical / Trunk Assessment Cervical / Trunk Assessment: Normal  Communication   Communication: No difficulties  Cognition Arousal/Alertness: Awake/alert Behavior During Therapy: WFL for tasks assessed/performed Overall Cognitive Status: Within Functional Limits for tasks assessed                                        General Comments  Exercises     Assessment/Plan    PT Assessment Patent does not need any further PT services  PT Problem List         PT Treatment Interventions      PT Goals (Current goals can be found in the Care Plan section)  Acute Rehab PT Goals Patient Stated Goal: to get home to wife PT Goal Formulation: All assessment and education complete, DC therapy    Frequency     Barriers to discharge        Co-evaluation               AM-PAC PT "6 Clicks" Mobility  Outcome Measure Help  needed turning from your back to your side while in a flat bed without using bedrails?: None Help needed moving from lying on your back to sitting on the side of a flat bed without using bedrails?: None Help needed moving to and from a bed to a chair (including a wheelchair)?: None Help needed standing up from a chair using your arms (e.g., wheelchair or bedside chair)?: None Help needed to walk in hospital room?: None Help needed climbing 3-5 steps with a railing? : None 6 Click Score: 24    End of Session Equipment Utilized During Treatment: Gait belt Activity Tolerance: Patient tolerated treatment well Patient left: in chair;with call bell/phone within reach Nurse Communication: Mobility status PT Visit Diagnosis: Muscle weakness (generalized) (M62.81)    Time: 7412-8786 PT Time Calculation (min) (ACUTE ONLY): 11 min   Charges:   PT Evaluation $PT Eval Low Complexity: 1 Low          Akeiba Axelson,PT Acute Rehabilitation Services Pager:  [phone]  Office:  [place] 01/14/2019, 1:13 PM

## 2019-01-14 NOTE — Discharge Instructions (Signed)
 [facility]  Discharge instructions  Lower Extremity Bypass Surgery  Please refer to the following instruction for your post-procedure care. Your surgeon or physician assistant will discuss any changes with you.  Activity  You are encouraged to walk as much as you can. You can slowly return to normal activities during the month after your surgery. Avoid strenuous activity and heavy lifting until your doctor tells you it's OK. Avoid activities such as vacuuming or swinging a golf club. Do not drive until your doctor give the OK and you are no longer taking prescription pain medications. It is also normal to have difficulty with sleep habits, eating and bowel movement after surgery. These will go away with time.  Bathing/Showering  You may shower after you go home. Do not soak in a bathtub, hot tub, or swim until the incision heals completely.  Incision Care  Clean your incision with mild soap and water. Shower every day. Pat the area dry with a clean towel. You do not need a bandage unless otherwise instructed. Do not apply any ointments or creams to your incision. If you have open wounds you will be instructed how to care for them or a visiting nurse may be arranged for you. If you have staples or sutures along your incision they will be removed at your post-op appointment. You may have skin glue on your incision. Do not peel it off. It will come off on its own in about one week. If you have a great deal of moisture in your groin, use a gauze help keep this area dry.  Diet  Resume your normal diet. There are no special food restrictions following this procedure. A low fat/ low cholesterol diet is recommended for all patients with vascular disease. In order to heal from your surgery, it is CRITICAL to get adequate nutrition. Your body requires vitamins, minerals, and protein. Vegetables are the best source of vitamins and minerals. Vegetables also provide the  perfect balance of protein. Processed food has little nutritional value, so try to avoid this.  Medications  Resume taking all your medications unless your doctor or nurse practitioner tells you not to. If your incision is causing pain, you may take over-the-counter pain relievers such as acetaminophen (Tylenol). If you were prescribed a stronger pain medication, please aware these medication can cause nausea and constipation. Prevent nausea by taking the medication with a snack or meal. Avoid constipation by drinking plenty of fluids and eating foods with high amount of fiber, such as fruits, vegetables, and grains. Take Colase 100 mg (an over-the-counter stool softener) twice a day as needed for constipation. Do not take Tylenol if you are taking prescription pain medications.  Follow Up  Our office will schedule a follow up appointment 2-3 weeks following discharge.  Please call us immediately for any of the following conditions  Severe or worsening pain in your legs or feet while at rest or while walking Increase pain, redness, warmth, or drainage (pus) from your incision site(s) Fever of 101 degree or higher The swelling in your leg with the bypass suddenly worsens and becomes more painful than when you were in the hospital If you have been instructed to feel your graft pulse then you should do so every day. If you can no longer feel this pulse, call the office immediately. Not all patients are given this instruction.  Leg swelling is common after leg bypass surgery.  The swelling should improve over a few months   following surgery. To improve the swelling, you may elevate your legs above the level of your heart while you are sitting or resting. Your surgeon or physician assistant may ask you to apply an ACE wrap or wear compression (TED) stockings to help to reduce swelling.  Reduce your risk of vascular disease  Stop smoking. If you would like help call [facility] at 1-800-QUIT-NOW  (1-800-784-8669) or [place] at 336-586-4000.  Manage your cholesterol Maintain a desired weight Control your diabetes weight Control your diabetes Keep your blood pressure down  If you have any questions, please call the office at [phone]   

## 2019-01-14 NOTE — Evaluation (Addendum)
Occupational Therapy Evaluation and Discharge from OT Patient Details Name: [name] MRN: [id number] DOB: [birth year] Today's Date: 01/14/2019    History of Present Illness Pt is a 73 y.o. male who developed an ischemic left lower extremity.  On CT he was found to have an occluded left external iliac artery.  He had undergone radical orchiectomy 6/1. He is now s/p left bypass graft femoral-femoral artery.    Clinical Impression   PLOF Pt was independent in ADL and transfers. Pt today was mod I (required increased time) for all aspects of ADL. Able to perform LB dressing, sink level grooming, transfers, educated on shower safety and compensatory strategies. Pt education complete and verbalizes understanding. Will have assist from wife and daughter if he needs any. Thank you for the opportunity to serve this patient.     Follow Up Recommendations  No OT follow up;Supervision - Intermittent    Equipment Recommendations  None recommended by OT    Recommendations for Other Services       Precautions / Restrictions Precautions Precautions: None      Mobility Bed Mobility               General bed mobility comments: OOB in recliner at beginning and end of session  Transfers Overall transfer level: Modified independent Equipment used: None             General transfer comment: increased time, no physical assist needed    Balance Overall balance assessment: Mild deficits observed, not formally tested                                         ADL either performed or assessed with clinical judgement   ADL Overall ADL's : Modified independent                                       General ADL Comments: can access LB to don/doff socks, transfers, sink level grooming     Vision Patient Visual Report: No change from baseline       Perception     Praxis      Pertinent Vitals/Pain Pain Assessment: 0-10 Pain Score: 2  Pain  Location: incision site Pain Descriptors / Indicators: Burning Pain Intervention(s): Monitored during session;Repositioned     Hand Dominance Right   Extremity/Trunk Assessment Upper Extremity Assessment Upper Extremity Assessment: Overall WFL for tasks assessed   Lower Extremity Assessment Lower Extremity Assessment: Defer to PT evaluation   Cervical / Trunk Assessment Cervical / Trunk Assessment: Normal   Communication Communication Communication: No difficulties   Cognition Arousal/Alertness: Awake/alert Behavior During Therapy: WFL for tasks assessed/performed Overall Cognitive Status: Within Functional Limits for tasks assessed                                     General Comments       Exercises     Shoulder Instructions      Home Living Family/patient expects to be discharged to:: Private residence Living Arrangements: Spouse/significant other Available Help at Discharge: Family;Available 24 hours/day Type of Home: House Home Access: Stairs to enter [employer] of Steps: 2   Home Layout: One level     Bathroom Shower/Tub: Walk-in shower  Bathroom Toilet: Handicapped height     Home Equipment: Shower seat;Grab bars - toilet;Grab bars - tub/shower;Hand held shower head          Prior Functioning/Environment Level of Independence: Independent                 OT Problem List: Pain      OT Treatment/Interventions:      OT Goals(Current goals can be found in the care plan section) Acute Rehab OT Goals Patient Stated Goal: to get home to wife OT Goal Formulation: With patient Time For Goal Achievement: 01/28/19 Potential to Achieve Goals: Good  OT Frequency:     Barriers to D/C:            Co-evaluation              AM-PAC OT "6 Clicks" Daily Activity     Outcome Measure Help from another person eating meals?: None Help from another person taking care of personal grooming?: None Help from another  person toileting, which includes using toliet, bedpan, or urinal?: None Help from another person bathing (including washing, rinsing, drying)?: None Help from another person to put on and taking off regular upper body clothing?: None Help from another person to put on and taking off regular lower body clothing?: None 6 Click Score: 24   End of Session Equipment Utilized During Treatment: Gait belt Nurse Communication: Mobility status  Activity Tolerance: Patient tolerated treatment well Patient left: in chair;with call bell/phone within reach  OT Visit Diagnosis: Other abnormalities of gait and mobility (R26.89);Pain Pain - Right/Left: Left Pain - part of body: Leg                Time: 1050-1105 OT Time Calculation (min): 15 min Charges:  OT General Charges $OT Visit: 1 Visit OT Evaluation $OT Eval Low Complexity: [place] OTR/L Acute Rehabilitation Services Pager: [phone] Office: [place] 01/14/2019, 11:16 AM

## 2019-01-14 NOTE — Progress Notes (Signed)
Patient ambulated in hallway with nursing staff. Tolerated well. Patient due to void. Will continue to monitor.[name], Bettina Gavia RN

## 2019-01-14 NOTE — Progress Notes (Signed)
   VASCULAR SURGERY ASSESSMENT & PLAN:   POD 1 - R -> L FemFem Bypass: Patient is doing well.  He has palpable dorsalis pedis pulses.  Home today if he is able to ambulate.  VASCULAR QUALITY INITIATIVE: He should stay on 81 mg of aspirin daily and a statin.   SUBJECTIVE:   No complaints.  PHYSICAL EXAM:   Vitals:   01/14/19 0100 01/14/19 0200 01/14/19 0300 01/14/19 0400  BP: 103/70 105/70 105/63 113/71  Pulse: 62 66 (!) 57 (!) 59  Resp: (!) 21 15 18 18   Temp:   97.9 F (36.6 C)   TempSrc:   Oral   SpO2: 97% 97% 97% 96%  Weight:      Height:       Palpable dorsalis pedis pulses. His incisions look fine.  LABS:   Lab Results  Component Value Date   WBC 9.2 01/13/2019   HGB 11.9 (L) 01/13/2019   HCT 35.7 (L) 01/13/2019   MCV 96.5 01/13/2019   PLT 184 01/13/2019   PROBLEM LIST:    Active Problems:   Iliac artery occlusion, left (HCC)   CURRENT MEDS:   . docusate sodium  100 mg Oral Daily  . heparin  5,000 Units Subcutaneous Q8H  . loratadine  10 mg Oral Daily  . pantoprazole  40 mg Oral Daily  . sertraline  50 mg Oral Daily    Deitra Mayo Beeper: [phone] Office: [phone] 01/14/2019

## 2019-01-15 NOTE — Discharge Summary (Signed)
[facility] Discharge Summary   Patient ID:  [name] MRN: [id number] DOB/AGE: [birth year] 75 y.o.  Admit date: 01/13/2019 Discharge date: 01/14/2019 Date of Surgery: 01/13/2019 Surgeon: Surgeon(s): Angelia Mould, MD  Admission Diagnosis: External iliac artery occlusion [facility]) [I74.5] Iliac artery occlusion, left [facility] ) [I74.5]  Discharge Diagnoses:  External iliac artery occlusion [facility]) [I74.5] Iliac artery occlusion, left ([place]) [I74.5]  Secondary Diagnoses: Past Medical History:  Diagnosis Date  . Depression   . GERD (gastroesophageal reflux disease)   . Hyperlipidemia   . Osteopenia     Procedure(s): BYPASS GRAFT FEMORAL-FEMORAL ARTERY  Discharged Condition: stable  HPI: 73 y/o male was seen at [facility] for decreased sensation, coolness to touch and decreased motor in his left LE.  He underwent a left inguinal radical orchiectomy yesterday.  Of note there was some bleeding noted at the time of surgery which stopped after the cord was clamped.  Initially did not have any significant left leg pain. He has an ischemic left LE and was taken to the OR emergently.   Hospital Course:  [name] is a 73 y.o. male is S/P Left Procedure(s): BYPASS GRAFT FEMORAL-FEMORAL ARTERY   Consults:  Treatment Team:  Alexis Frock, MD Angelia Mould, MD   Post op he had palpable DP pulses bilaterally, incisions are healing well B groins are soft without hematoma.  He has ambulated, tol. PO's and voided.  He was discharged home in stable condition.    Significant Diagnostic Studies: CBC Lab Results  Component Value Date   WBC 9.2 01/13/2019   HGB 11.9 (L) 01/13/2019   HCT 35.7 (L) 01/13/2019   MCV 96.5 01/13/2019   PLT 184 01/13/2019    BMET    Component Value Date/Time   NA 135 01/13/2019 2326   K 4.4 01/13/2019 2326   CL 102 01/13/2019 2326   CO2 26 01/13/2019 2326   GLUCOSE 146 (H) 01/13/2019 2326   BUN 11 01/13/2019 2326   CREATININE 1.03 01/13/2019 2326   CALCIUM 8.8 (L) 01/13/2019 2326   GFRNONAA >60 01/13/2019 2326   GFRAA >60 01/13/2019 2326   COAG Lab Results  Component Value Date   INR 1.0 06/05/2008   INR 1.0 05/27/2008     Disposition:  Discharge to :Home Discharge Instructions    Call MD for:  redness, tenderness, or signs of infection (pain, swelling, bleeding, redness, odor or green/yellow discharge around incision site)   Complete by:  As directed    Call MD for:  severe or increased pain, loss or decreased feeling  in affected limb(s)   Complete by:  As directed    Call MD for:  temperature >100.5   Complete by:  As directed    Resume previous diet   Complete by:  As directed      Allergies as of 01/14/2019   No Known Allergies     Medication List    TAKE these medications   HYDROcodone-acetaminophen 5-325 MG tablet Commonly known as:  NORCO/VICODIN Take 1 tablet by mouth every 4 (four) hours as needed for moderate pain.   loratadine 10 MG tablet Commonly known as:  CLARITIN Take 10 mg by mouth daily. Patient used this medication for his allergies.   omeprazole 20 MG capsule Commonly known as:  PRILOSEC Take 20 mg by mouth daily.   sertraline 50 MG tablet Commonly known as:  ZOLOFT Take 50 mg by mouth daily.   Vitamin D3 1.25 MG (50000 UT) Tabs Take  1 tablet by mouth at bedtime.      Verbal and written Discharge instructions given to the patient. Wound care per Discharge AVS Follow-up Information    Angelia Mould, MD Follow up in 2 week(s).   Specialties:  Vascular Surgery, Cardiology Why:  office will call Contact information: [address] [place] [address] [phone]           Signed: Roxy Horseman 01/15/2019, 8:16 AM - For VQI Registry use --- Instructions: Press F2 to tab through selections.  Delete question if not applicable.   Post-op:  Wound infection: No  Graft infection: No  Transfusion: No  If yes,  units given New  Arrhythmia: No Ipsilateral amputation: [x ] no, [ ]  Minor, [ ]  BKA, [ ]  AKA Discharge patency: [ ]  Primary, [ ]  Primary assisted, [ ]  Secondary, [ ]  Occluded Patency judged by: [ ]  Dopper only, [ ]  Palpable graft pulse, [ ]  Palpable distal pulse, [ ]  ABI inc. > 0.15, [ ]  Duplex Discharge ABI: R 1.23, L 1.12 Discharge TBI: R 1.23, L 1.12 D/C Ambulatory Status: Ambulatory  Complications: MI: [x ] No, [ ]  Troponin only, [ ]  EKG or Clinical CHF: No Resp failure: [x ] none, [ ]  Pneumonia, [ ]  Ventilator Chg in renal function: x[ ]  none, [ ]  Inc. Cr > 0.5, [ ]  Temp. Dialysis, [ ]  Permanent dialysis Stroke: [x ] None, [ ]  Minor, [ ]  Major Return to OR: No  Reason for return to OR: [ ]  Bleeding, [ ]  Infection, [ ]  Thrombosis, [ ]  Revision  Discharge medications: Statin use:  No  for medical reason   ASA use:  No  for medical reason   Plavix use:  No  for medical reason   Beta blocker use: No  for medical reason   Coumadin use: No  for medical reason

## 2019-01-30 DIAGNOSIS — R93812 Abnormal radiologic findings on diagnostic imaging of left testicle: Secondary | ICD-10-CM | POA: Diagnosis not present

## 2019-02-02 ENCOUNTER — Emergency Department (HOSPITAL_COMMUNITY): Payer: Medicare Other

## 2019-02-02 ENCOUNTER — Encounter (HOSPITAL_COMMUNITY): Payer: Self-pay

## 2019-02-02 ENCOUNTER — Other Ambulatory Visit: Payer: Self-pay

## 2019-02-02 ENCOUNTER — Emergency Department (HOSPITAL_COMMUNITY)
Admission: EM | Admit: 2019-02-02 | Discharge: 2019-02-02 | Disposition: A | Discharge status: Home / Self Care | Payer: Medicare Other | Attending: Emergency Medicine | Admitting: Emergency Medicine

## 2019-02-02 DIAGNOSIS — Z87891 Personal history of nicotine dependence: Secondary | ICD-10-CM | POA: Insufficient documentation

## 2019-02-02 DIAGNOSIS — D649 Anemia, unspecified: Secondary | ICD-10-CM | POA: Diagnosis not present

## 2019-02-02 DIAGNOSIS — R55 Syncope and collapse: Secondary | ICD-10-CM | POA: Diagnosis not present

## 2019-02-02 DIAGNOSIS — I959 Hypotension, unspecified: Secondary | ICD-10-CM | POA: Diagnosis not present

## 2019-02-02 DIAGNOSIS — T671XXA Heat syncope, initial encounter: Secondary | ICD-10-CM | POA: Diagnosis not present

## 2019-02-02 LAB — COMPREHENSIVE METABOLIC PANEL
ALT: 12 U/L (ref 0–44)
AST: 16 U/L (ref 15–41)
Albumin: 2.2 g/dL — ABNORMAL LOW (ref 3.5–5.0)
Alkaline Phosphatase: 68 U/L (ref 38–126)
Anion gap: 5 (ref 5–15)
BUN: 11 mg/dL (ref 8–23)
CO2: 19 mmol/L — ABNORMAL LOW (ref 22–32)
Calcium: 6.2 mg/dL — CL (ref 8.9–10.3)
Chloride: 116 mmol/L — ABNORMAL HIGH (ref 98–111)
Creatinine, Ser: 0.9 mg/dL (ref 0.61–1.24)
GFR calc Af Amer: >60 mL/min (ref >60)
GFR calc non Af Amer: >60 mL/min (ref >60)
Glucose, Bld: 105 mg/dL — ABNORMAL HIGH (ref 70–99)
Potassium: 3.1 mmol/L — ABNORMAL LOW (ref 3.5–5.1)
Sodium: 140 mmol/L (ref 135–145)
Total Bilirubin: 0.2 mg/dL — ABNORMAL LOW (ref 0.3–1.2)
Total Protein: 4.4 g/dL — ABNORMAL LOW (ref 6.5–8.1)

## 2019-02-02 LAB — CBC WITH DIFFERENTIAL/PLATELET
Abs Immature Granulocytes: 0.01 10*3/uL (ref 0.00–0.07)
Basophils Absolute: 0 10*3/uL (ref 0.0–0.1)
Basophils Relative: 1 %
Eosinophils Absolute: 0.3 10*3/uL (ref 0.0–0.5)
Eosinophils Relative: 6 %
HCT: 29 % — ABNORMAL LOW (ref 39.0–52.0)
Hemoglobin: 9.3 g/dL — ABNORMAL LOW (ref 13.0–17.0)
Immature Granulocytes: 0 %
Lymphocytes Relative: 36 %
Lymphs Abs: 1.8 10*3/uL (ref 0.7–4.0)
MCH: 32.6 pg (ref 26.0–34.0)
MCHC: 32.1 g/dL (ref 30.0–36.0)
MCV: 101.8 fL — ABNORMAL HIGH (ref 80.0–100.0)
Monocytes Absolute: 0.5 10*3/uL (ref 0.1–1.0)
Monocytes Relative: 10 %
Neutro Abs: 2.3 10*3/uL (ref 1.7–7.7)
Neutrophils Relative %: 47 %
Platelets: 214 10*3/uL (ref 150–400)
RBC: 2.85 MIL/uL — ABNORMAL LOW (ref 4.22–5.81)
RDW: 13.1 % (ref 11.5–15.5)
WBC: 5 10*3/uL (ref 4.0–10.5)
nRBC: 0 % (ref 0.0–0.2)

## 2019-02-02 LAB — URINALYSIS, ROUTINE W REFLEX MICROSCOPIC
Bilirubin Urine: NEGATIVE
Glucose, UA: NEGATIVE mg/dL
Hgb urine dipstick: NEGATIVE
Ketones, ur: NEGATIVE mg/dL
Leukocytes,Ua: NEGATIVE
Nitrite: NEGATIVE
Protein, ur: NEGATIVE mg/dL
Specific Gravity, Urine: 1.006 (ref 1.005–1.030)
pH: 6 (ref 5.0–8.0)

## 2019-02-02 LAB — MAGNESIUM: Magnesium: 1.5 mg/dL — ABNORMAL LOW (ref 1.7–2.4)

## 2019-02-02 LAB — CBG MONITORING, ED: Glucose-Capillary: 123 mg/dL — ABNORMAL HIGH (ref 70–99)

## 2019-02-02 LAB — POC OCCULT BLOOD, ED: Fecal Occult Bld: NEGATIVE

## 2019-02-02 MED ORDER — CALCIUM CARBONATE-VITAMIN D 500-200 MG-UNIT PO TABS: 1.0000 | ORAL_TABLET | Freq: Two times a day (BID) | ORAL | 0 refills | Status: DC

## 2019-02-02 MED ORDER — SODIUM CHLORIDE 0.9 % IV SOLN
1.0000 g | Freq: Once | INTRAVENOUS | Status: AC
  Administered 2019-02-02: 1 g via INTRAVENOUS
  Filled 2019-02-02: qty 10

## 2019-02-02 MED ORDER — LACTATED RINGERS IV BOLUS
1000.0000 mL | Freq: Once | INTRAVENOUS | Status: AC
  Administered 2019-02-02: 1000 mL via INTRAVENOUS

## 2019-02-02 MED ORDER — MAGNESIUM SULFATE 2 GM/50ML IV SOLN
2.0000 g | Freq: Once | INTRAVENOUS | Status: AC
  Administered 2019-02-02: 2 g via INTRAVENOUS
  Filled 2019-02-02: qty 50

## 2019-02-02 NOTE — ED Triage Notes (Signed)
Pt BIB [facility] for eval of near syncope while doing yard work. Pt reports he felt weak, dizzy while doing yard work. Stated he began to experience tunnel vision, pt denies LOC, bystanders report "thousand yard stare" on scene. Pt reports sx started after standing. Pt denies CP/SOB/N/V.

## 2019-02-02 NOTE — ED Notes (Signed)
Ca++ 6.2 

## 2019-02-02 NOTE — ED Provider Notes (Signed)
Emergency Department Provider Note   I have reviewed the triage vital signs and the nursing notes.   HISTORY  Chief Complaint Near Syncope   HPI [name] is a 73 y.o. male who presents the emergency department today with loss of consciousness.  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  He stood up from it felt lightheaded and had episode of syncope it lasted approximately 2 to 3 minutes.  Patient had no surrounding chest pain, vision changes, headache, back pain, abdominal pain or other associated symptoms.  Patient did recently have a bypass for his femoral occlusions and iliac.  His wounds look good and he has any problems with that follows up with them tomorrow.  He is never syncopized before.  No recent illnesses.  States is been eating and drinking normally.  No recent fevers, cough, urinary symptoms or signs of infection.  Patient drinking same amount of coffee this morning.  EMS called found him to have BP in 90's with normal ECG and CBG > 100. Started fluids and patient's symptoms consistently improved along with his blood pressures. Patient asymptomatic at this time.   No other associated or modifying symptoms.    Past Medical History:  Diagnosis Date  . Depression   . GERD (gastroesophageal reflux disease)   . Hyperlipidemia   . Osteopenia     Patient Active Problem List   Diagnosis Date Noted  . Iliac artery occlusion, left ([place]) 01/13/2019  . Right knee pain 03/02/2015    Past Surgical History:  Procedure Laterality Date  . FEMORAL-FEMORAL BYPASS GRAFT Left 01/13/2019   Procedure: BYPASS GRAFT FEMORAL-FEMORAL ARTERY;  Surgeon: Angelia Mould, MD;  Location: [place];  Service: Vascular;  Laterality: Left;  . LUMBAR LAMINECTOMY    . ORCHIECTOMY Left 01/12/2019   Procedure: LEFT INGUINAL RADICAL ORCHIECTOMY;  Surgeon: Lucas Mallow, MD;  Location: [facility];  Service:  Urology;  Laterality: Left;  . ROTATOR CUFF REPAIR Right   . UMBILICAL HERNIA REPAIR      Current Outpatient Rx  . Order #: [id number] Class: Historical Med  . Order #: [id number] Class: Normal  . Order #: [id number] Class: Historical Med  . Order #: [id number] Class: Historical Med  . Order #: [id number] Class: Historical Med    Allergies Patient has no known allergies.  History reviewed. No pertinent family history.  Social History Social History   Tobacco Use  . Smoking status: Former Research scientist (life sciences)  . Smokeless tobacco: Never Used  . Tobacco comment: quit 1990's  Substance Use Topics  . Alcohol use: Yes    Alcohol/week: 6.0 standard drinks    Types: 6 Cans of beer per week    Comment: beer occasional  . Drug use: Never    Review of Systems  All other systems negative except as documented in the HPI. All pertinent positives and negatives as reviewed in the HPI. ____________________________________________   PHYSICAL EXAM:  VITAL SIGNS: ED Triage Vitals  Enc Vitals Group     BP 02/02/19 1517 127/63     Pulse Rate 02/02/19 1517 84     Resp 02/02/19 1517 15     Temp 02/02/19 1517 98.2 F (36.8 C)     Temp Source 02/02/19 1517 Oral     SpO2 02/02/19 1512 99 %     Weight 02/02/19 1517 213 lb 13.5 oz (97 kg)     Height 02/02/19 1517 6' (1.829 m)  Constitutional: Alert and oriented. Well appearing and in no acute distress. Eyes: Conjunctivae are normal. PERRL. EOMI. Head: Atraumatic. Nose: No congestion/rhinnorhea. Mouth/Throat: Mucous membranes are moist.  Oropharynx non-erythematous. Neck: No stridor.  No meningeal signs.   Cardiovascular: Normal rate, regular rhythm. Good peripheral circulation. Grossly normal heart sounds.   Respiratory: Normal respiratory effort.  No retractions. Lungs CTAB. Gastrointestinal: Soft and nontender. No distention.  Musculoskeletal: No lower extremity tenderness nor edema. No gross deformities of extremities. Neurologic:  Normal speech and  language. No gross focal neurologic deficits are appreciated.  Skin:  Skin is warm, dry and intact. No rash noted.   ____________________________________________   LABS (all labs ordered are listed, but only abnormal results are displayed)  Labs Reviewed  CBC WITH DIFFERENTIAL/PLATELET  COMPREHENSIVE METABOLIC PANEL  URINALYSIS, ROUTINE W REFLEX MICROSCOPIC  CBG MONITORING, ED   ____________________________________________  EKG   EKG Interpretation  Date/Time:    Ventricular Rate:    PR Interval:    QRS Duration:   QT Interval:    QTC Calculation:   R Axis:     Text Interpretation:         ____________________________________________  RADIOLOGY  No results found.  ____________________________________________   PROCEDURES  Procedure(s) performed:   Procedures   ____________________________________________   INITIAL IMPRESSION / ASSESSMENT AND PLAN / ED COURSE  Symptoms seem very consistent with orthostatic cause for syncope.  There is no high risk factors for coronary, pulmonary or neurologic causes.  He likely does have bad arteries secondary to his known occlusions in his lower extremities however normal vital signs and a very benign story I do not feel like I need to go too far down that route unless he remains symptomatic or something comes up on labs that is different.  Calcium Correction for Hypoalbuminemia from [email or web address]  on 02/02/2019 ** All calculations should be rechecked by clinician prior to use **  RESULT SUMMARY: 7.6 mg/dL Corrected Calcium   INPUTS: Calcium -> 6.2 mg/dL Albumin -> 2.2 g/dL Normal albumin: 4 g/dL or 40 g/L -> 4 g/dL     repleted electrolytes. Ambulates without difficulty. Significant improvements. Will dc on calcium w/ pcp follow up to recheck    Pertinent labs & imaging results that were available during my care of the patient were reviewed by me and considered in my medical decision making (see chart for  details).   A medical screening exam was performed and I feel the patient has had an appropriate workup for their chief complaint at this time and likelihood of emergent condition existing is low. They have been counseled on decision, discharge, follow up and which symptoms necessitate immediate return to the emergency department. They or their family verbally stated understanding and agreement with plan and discharged in stable condition.   ____________________________________________  FINAL CLINICAL IMPRESSION(S) / ED DIAGNOSES  Final diagnoses:  None     MEDICATIONS GIVEN DURING THIS VISIT:  Medications  lactated ringers bolus 1,000 mL (has no administration in time range)     NEW OUTPATIENT MEDICATIONS STARTED DURING THIS VISIT:  New Prescriptions   No medications on file    Note:  This note was prepared with assistance of Dragon voice recognition software. Occasional wrong-word or sound-a-like substitutions may have occurred due to the inherent limitations of voice recognition software.   Merrily Pew, MD 02/03/19 [phone]

## 2019-02-03 ENCOUNTER — Telehealth (HOSPITAL_COMMUNITY): Payer: Self-pay | Admitting: Rehabilitation

## 2019-02-03 NOTE — Telephone Encounter (Signed)

## 2019-02-04 ENCOUNTER — Ambulatory Visit (INDEPENDENT_AMBULATORY_CARE_PROVIDER_SITE_OTHER): Payer: Medicare Other | Admitting: Vascular Surgery

## 2019-02-04 ENCOUNTER — Encounter: Payer: Self-pay | Admitting: Vascular Surgery

## 2019-02-04 ENCOUNTER — Other Ambulatory Visit: Payer: Self-pay

## 2019-02-04 VITALS — BP 132/82 | HR 66 | Temp 97.1°F | Resp 18 | Ht 72.0 in | Wt 220.0 lb

## 2019-02-04 DIAGNOSIS — Z48812 Encounter for surgical aftercare following surgery on the circulatory system: Secondary | ICD-10-CM

## 2019-02-04 DIAGNOSIS — I745 Embolism and thrombosis of iliac artery: Secondary | ICD-10-CM

## 2019-02-04 NOTE — Progress Notes (Signed)
   Patient name: [name] MRN: [id number] DOB: Mar 21, 1946 Sex: male  REASON FOR VISIT:   Follow-up after right to left femorofemoral bypass  HPI:   [name] is a pleasant 73 y.o. male who had presented with an ischemic left lower extremity.  On CT scan he was found to have an occluded left external iliac artery.  He had undergone radical orchiectomy the day before.  On 01/13/2019, he underwent a right to left femorofemoral bypass with an 8 mm PTFE graft.  He presents for a follow-up visit.  The patient is doing well.  He denies claudication, or rest pain.  He had been on aspirin in the past but this was stopped.  He is not on a statin.  Current Outpatient Medications  Medication Sig Dispense Refill  . acetaminophen (TYLENOL) 500 MG tablet Take 500 mg by mouth every 6 (six) hours as needed for headache (pain).    . calcium-vitamin D (OSCAL WITH D) 500-200 MG-UNIT tablet Take 1 tablet by mouth 2 (two) times daily for 10 days. 20 tablet 0  . cholecalciferol (VITAMIN D) 25 MCG (1000 UT) tablet Take 1,000 Units by mouth at bedtime.     Marland Kitchen loratadine (CLARITIN) 10 MG tablet Take 10 mg by mouth daily. Patient used this medication for his allergies.    Marland Kitchen omeprazole (PRILOSEC OTC) 20 MG tablet Take 20 mg by mouth daily as needed (acid reflux).    . sertraline (ZOLOFT) 50 MG tablet Take 50 mg by mouth daily.     No current facility-administered medications for this visit.     REVIEW OF SYSTEMS:  [X]  denotes positive finding, [ ]  denotes negative finding Vascular    Leg swelling    Cardiac    Chest pain or chest pressure:    Shortness of breath upon exertion:    Short of breath when lying flat:    Irregular heart rhythm:    Constitutional    Fever or chills:     PHYSICAL EXAM:   Vitals:   02/04/19 0810  BP: 132/82  Pulse: 66  Resp: 18  Temp: (!) 97.1 F (36.2 C)  TempSrc: Temporal  SpO2: 97%  Weight: 220 lb (99.8 kg)  Height: 6' (1.829 m)    GENERAL: The patient  is a well-nourished male, in no acute distress. The vital signs are documented above. CARDIOVASCULAR: There is a regular rate and rhythm. PULMONARY: There is good air exchange bilaterally without wheezing or rales. VASCULAR: His groin incisions are healing nicely. He has palpable dorsalis pedis pulses bilaterally.  DATA:   No new data.  MEDICAL ISSUES:   STATUS POST RIGHT TO LEFT FEMOROFEMORAL BYPASS: Patient's bypass graft is patent.  He has palpable pedal pulses bilaterally.  I explained that we will follow his graft closely.  I have ordered a graft duplex and ABIs in 6 months and we will see him back at that time.  He knows to call sooner if he has problems.  VASCULAR QUALITY INITIATIVE: I have instructed him to begin taking 81 mg of aspirin daily.  In addition I recommended that he begin a low-dose statin.  I have offered to send this prescription to his pharmacy, however he is speaking to his primary care physician today and will discuss this with him.  Typically we have started with 10 mg of Lipitor daily if tolerated.  Deitra Mayo [facility] [phone]

## 2019-02-10 DIAGNOSIS — E785 Hyperlipidemia, unspecified: Secondary | ICD-10-CM | POA: Diagnosis not present

## 2019-02-10 DIAGNOSIS — K219 Gastro-esophageal reflux disease without esophagitis: Secondary | ICD-10-CM | POA: Diagnosis not present

## 2019-02-10 DIAGNOSIS — J45901 Unspecified asthma with (acute) exacerbation: Secondary | ICD-10-CM | POA: Diagnosis not present

## 2019-02-24 DIAGNOSIS — Z7982 Long term (current) use of aspirin: Secondary | ICD-10-CM | POA: Diagnosis not present

## 2019-02-24 DIAGNOSIS — Z79899 Other long term (current) drug therapy: Secondary | ICD-10-CM | POA: Diagnosis not present

## 2019-02-24 DIAGNOSIS — R945 Abnormal results of liver function studies: Secondary | ICD-10-CM | POA: Diagnosis not present

## 2019-03-13 ENCOUNTER — Other Ambulatory Visit: Payer: Self-pay

## 2019-07-07 DIAGNOSIS — Z23 Encounter for immunization: Secondary | ICD-10-CM | POA: Diagnosis not present

## 2019-07-21 ENCOUNTER — Other Ambulatory Visit: Payer: Self-pay | Admitting: Dermatology

## 2019-07-21 DIAGNOSIS — D044 Carcinoma in situ of skin of scalp and neck: Secondary | ICD-10-CM | POA: Diagnosis not present

## 2019-07-21 DIAGNOSIS — C4492 Squamous cell carcinoma of skin, unspecified: Secondary | ICD-10-CM

## 2019-07-21 DIAGNOSIS — L570 Actinic keratosis: Secondary | ICD-10-CM | POA: Diagnosis not present

## 2019-07-21 DIAGNOSIS — L739 Follicular disorder, unspecified: Secondary | ICD-10-CM | POA: Diagnosis not present

## 2019-07-21 HISTORY — DX: Squamous cell carcinoma of skin, unspecified: C44.92

## 2019-08-17 DIAGNOSIS — Z125 Encounter for screening for malignant neoplasm of prostate: Secondary | ICD-10-CM | POA: Diagnosis not present

## 2019-08-17 DIAGNOSIS — E785 Hyperlipidemia, unspecified: Secondary | ICD-10-CM | POA: Diagnosis not present

## 2019-08-24 DIAGNOSIS — Z0000 Encounter for general adult medical examination without abnormal findings: Secondary | ICD-10-CM | POA: Diagnosis not present

## 2019-08-24 DIAGNOSIS — M8580 Other specified disorders of bone density and structure, unspecified site: Secondary | ICD-10-CM | POA: Diagnosis not present

## 2019-08-24 DIAGNOSIS — I739 Peripheral vascular disease, unspecified: Secondary | ICD-10-CM | POA: Diagnosis not present

## 2019-08-24 DIAGNOSIS — K219 Gastro-esophageal reflux disease without esophagitis: Secondary | ICD-10-CM | POA: Diagnosis not present

## 2019-08-24 DIAGNOSIS — Z6830 Body mass index (BMI) 30.0-30.9, adult: Secondary | ICD-10-CM | POA: Diagnosis not present

## 2019-08-24 DIAGNOSIS — J309 Allergic rhinitis, unspecified: Secondary | ICD-10-CM | POA: Diagnosis not present

## 2019-08-24 DIAGNOSIS — Z1212 Encounter for screening for malignant neoplasm of rectum: Secondary | ICD-10-CM | POA: Diagnosis not present

## 2019-08-24 DIAGNOSIS — Z7982 Long term (current) use of aspirin: Secondary | ICD-10-CM | POA: Diagnosis not present

## 2019-08-24 DIAGNOSIS — H9193 Unspecified hearing loss, bilateral: Secondary | ICD-10-CM | POA: Diagnosis not present

## 2019-08-24 DIAGNOSIS — F411 Generalized anxiety disorder: Secondary | ICD-10-CM | POA: Diagnosis not present

## 2019-08-26 ENCOUNTER — Encounter (HOSPITAL_COMMUNITY): Payer: Medicare Other

## 2019-08-26 ENCOUNTER — Ambulatory Visit: Payer: Medicare Other | Admitting: Family

## 2019-09-21 ENCOUNTER — Telehealth (HOSPITAL_COMMUNITY): Payer: Self-pay

## 2019-09-21 DIAGNOSIS — I739 Peripheral vascular disease, unspecified: Secondary | ICD-10-CM | POA: Diagnosis not present

## 2019-09-21 DIAGNOSIS — E785 Hyperlipidemia, unspecified: Secondary | ICD-10-CM | POA: Diagnosis not present

## 2019-09-21 NOTE — Telephone Encounter (Signed)

## 2019-09-22 ENCOUNTER — Ambulatory Visit (INDEPENDENT_AMBULATORY_CARE_PROVIDER_SITE_OTHER): Payer: Medicare Other | Admitting: Physician Assistant

## 2019-09-22 ENCOUNTER — Other Ambulatory Visit: Payer: Self-pay

## 2019-09-22 ENCOUNTER — Ambulatory Visit (INDEPENDENT_AMBULATORY_CARE_PROVIDER_SITE_OTHER)
Admission: RE | Admit: 2019-09-22 | Discharge: 2019-09-22 | Disposition: A | Discharge status: Home / Self Care | Payer: Medicare Other | Source: Ambulatory Visit | Attending: Vascular Surgery | Admitting: Vascular Surgery

## 2019-09-22 ENCOUNTER — Ambulatory Visit (HOSPITAL_COMMUNITY)
Admission: RE | Admit: 2019-09-22 | Discharge: 2019-09-22 | Disposition: A | Discharge status: Home / Self Care | Payer: Medicare Other | Source: Ambulatory Visit | Attending: Vascular Surgery | Admitting: Vascular Surgery

## 2019-09-22 VITALS — Ht 72.0 in | Wt 220.0 lb

## 2019-09-22 DIAGNOSIS — I745 Embolism and thrombosis of iliac artery: Secondary | ICD-10-CM | POA: Diagnosis not present

## 2019-09-22 DIAGNOSIS — Z48812 Encounter for surgical aftercare following surgery on the circulatory system: Secondary | ICD-10-CM

## 2019-09-22 DIAGNOSIS — F411 Generalized anxiety disorder: Secondary | ICD-10-CM | POA: Diagnosis not present

## 2019-09-22 DIAGNOSIS — I739 Peripheral vascular disease, unspecified: Secondary | ICD-10-CM | POA: Diagnosis not present

## 2019-09-22 NOTE — Progress Notes (Signed)
Office Note      Virtual Visit via Telephone Note   I connected with [name] on 09/22/2019 using the by telephone and verified that I was speaking with the correct person using two identifiers. Patient was located at home and accompanied alone. I am located at [facility].   The limitations of evaluation and management by telemedicine and the availability of in person appointments have been previously discussed with the patient and are documented in the patients chart. The patient expressed understanding and consented to proceed.  PCP: Deland Pretty, MD   Chief Complaint: 74 year old male follow-up right to left femoral to femoral artery bypass with PTFE graft secondary to ischemic left lower extremity and findings on CT scan of occluded left external iliac artery on January 13, 2019.  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  He states he has no problem healing minor scrapes to his lower extremities.  He had an appointment this morning with Dr. Shelia Media who had placed him on rosuvastatin 10 mg daily at the first of the year.  He states his lipid panel has improved.  He also states he takes an aspirin 81 mg daily  Past Medical History:  Diagnosis Date  . Depression   . GERD (gastroesophageal reflux disease)   . Hyperlipidemia   . Osteopenia     Past Surgical History:  Procedure Laterality Date  . FEMORAL-FEMORAL BYPASS GRAFT Left 01/13/2019   Procedure: BYPASS GRAFT FEMORAL-FEMORAL ARTERY;  Surgeon: Angelia Mould, MD;  Location: [place];  Service: Vascular;  Laterality: Left;  . LUMBAR LAMINECTOMY    . ORCHIECTOMY Left 01/12/2019   Procedure: LEFT INGUINAL RADICAL ORCHIECTOMY;  Surgeon: Lucas Mallow, MD;  Location: [facility];  Service: Urology;  Laterality: Left;  . ROTATOR CUFF REPAIR Right   . UMBILICAL HERNIA REPAIR      Current Meds  Medication Sig  . acetaminophen (TYLENOL) 500 MG tablet Take 500  mg by mouth every 6 (six) hours as needed for headache (pain).  . cholecalciferol (VITAMIN D) 25 MCG (1000 UT) tablet Take 1,000 Units by mouth at bedtime.   Marland Kitchen loratadine (CLARITIN) 10 MG tablet Take 10 mg by mouth daily. Patient used this medication for his allergies.  Marland Kitchen omeprazole (PRILOSEC OTC) 20 MG tablet Take 20 mg by mouth daily as needed (acid reflux).  . sertraline (ZOLOFT) 50 MG tablet Take 50 mg by mouth daily.    12 system ROS was negative unless otherwise noted in HPI   Observations/Objective: ABIs on January 14, 2019: Right equal 1.23, left equal 1.12  ABI/TBIToday's ABIToday's TBIPrevious ABIPrevious TBI  +-------+-----------+-----------+------------+------------+  Right 1.2    0.52                  +-------+-----------+-----------+------------+------------+  Left  1.2    1.31                  Triphasic waveforms are noted in both lower extremities   Right Graft #1:  +------------------+--------+--------+---------+--------+           PSV cm/sStenosisWaveform Comments  +------------------+--------+--------+---------+--------+  Inflow      147       triphasic      +------------------+--------+--------+---------+--------+  Prox Anastomosis 88       triphasic      +------------------+--------+--------+---------+--------+  Proximal Graft  135       triphasic      +------------------+--------+--------+---------+--------+  Mid  Graft     111       triphasic      +------------------+--------+--------+---------+--------+  Distal Graft   110       triphasic      +------------------+--------+--------+---------+--------+  Distal Anastomosis93       triphasic      +------------------+--------+--------+---------+--------+  Outflow      82       triphasic        +------------------+--------+--------+---------+--------+      Summary:  Right: Patent right to left bypass graft with no evidence of stenosis.   Lipid panel results 09/21/2019: Cholesterol 140 Triglycerides 61 HDL 62 LDL/HDL ratio 1.1   Assessment and Plan: History of ischemic left lower extremity status post right to left femoral to femoral artery bypass with PTFE graft January 13, 2019.  I discussed findings of the patient's ABI and duplex ultrasound.  I explained his graft was patent and ABIs normal.  Encouraged him to continue walking, his current medications and signs and symptoms of acute ischemia.  Follow Up Instructions:   Follow up in 1 year(s)   I discussed the assessment and treatment plan with the patient. The patient was provided an opportunity to ask questions and all were answered. The patient agreed with the plan and demonstrated an understanding of the instructions.   The patient was advised to call back or seek an in-person evaluation if the symptoms worsen or if the condition fails to improve as anticipated.  I spent 9 minutes with the patient via telephone encounter.   Signed, Barbie Banner [facility] [place] Office: [phone]  09/22/2019, 2:19 PM

## 2019-09-23 ENCOUNTER — Other Ambulatory Visit: Payer: Self-pay | Admitting: *Deleted

## 2019-09-23 DIAGNOSIS — I745 Embolism and thrombosis of iliac artery: Secondary | ICD-10-CM

## 2019-09-26 ENCOUNTER — Ambulatory Visit: Payer: Medicare Other | Attending: Internal Medicine

## 2019-09-26 DIAGNOSIS — Z23 Encounter for immunization: Secondary | ICD-10-CM | POA: Insufficient documentation

## 2019-09-26 NOTE — Progress Notes (Signed)
   Covid-19 Vaccination Clinic  Name:  [name]    MRN: [id number] DOB: 11/12/1945  09/26/2019  Mr. [name] was observed post Covid-19 immunization for 15 minutes without incidence. He was provided with Vaccine Information Sheet and instruction to access the V-Safe system.   Mr. [name] was instructed to call 911 with any severe reactions post vaccine: Marland Kitchen Difficulty breathing  . Swelling of your face and throat  . A fast heartbeat  . A bad rash all over your body  . Dizziness and weakness    Immunizations Administered    Name Date Dose VIS Date Route   Pfizer COVID-19 Vaccine 09/26/2019  8:35 AM 0.3 mL 07/24/2019 Intramuscular   Manufacturer: [place]   Lot: [id number]   [place]: [id number]

## 2019-10-18 ENCOUNTER — Ambulatory Visit: Payer: Medicare Other | Attending: Internal Medicine

## 2019-10-18 DIAGNOSIS — Z23 Encounter for immunization: Secondary | ICD-10-CM | POA: Insufficient documentation

## 2019-10-18 NOTE — Progress Notes (Signed)
   Covid-19 Vaccination Clinic  Name:  [name]    MRN: [id number] DOB: May 06, 1946  10/18/2019  [name] was observed post Covid-19 immunization for 15 minutes without incident. He was provided with Vaccine Information Sheet and instruction to access the V-Safe system.   [name] was instructed to call 911 with any severe reactions post vaccine: Marland Kitchen Difficulty breathing  . Swelling of face and throat  . A fast heartbeat  . A bad rash all over body  . Dizziness and weakness   Immunizations Administered    Name Date Dose VIS Date Route   Pfizer COVID-19 Vaccine 10/18/2019 10:36 AM 0.3 mL 07/24/2019 Intramuscular   Manufacturer: [place]   Lot: [id number]   [place]: [id number]

## 2019-12-07 DIAGNOSIS — H5203 Hypermetropia, bilateral: Secondary | ICD-10-CM | POA: Diagnosis not present

## 2019-12-07 DIAGNOSIS — H25813 Combined forms of age-related cataract, bilateral: Secondary | ICD-10-CM | POA: Diagnosis not present

## 2019-12-07 DIAGNOSIS — H52223 Regular astigmatism, bilateral: Secondary | ICD-10-CM | POA: Diagnosis not present

## 2019-12-07 DIAGNOSIS — H524 Presbyopia: Secondary | ICD-10-CM | POA: Diagnosis not present

## 2019-12-23 DIAGNOSIS — H0100A Unspecified blepharitis right eye, upper and lower eyelids: Secondary | ICD-10-CM | POA: Diagnosis not present

## 2019-12-23 DIAGNOSIS — H40003 Preglaucoma, unspecified, bilateral: Secondary | ICD-10-CM | POA: Diagnosis not present

## 2019-12-23 DIAGNOSIS — H52202 Unspecified astigmatism, left eye: Secondary | ICD-10-CM | POA: Diagnosis not present

## 2019-12-23 DIAGNOSIS — H02055 Trichiasis without entropian left lower eyelid: Secondary | ICD-10-CM | POA: Diagnosis not present

## 2019-12-23 DIAGNOSIS — H527 Unspecified disorder of refraction: Secondary | ICD-10-CM | POA: Diagnosis not present

## 2019-12-23 DIAGNOSIS — H25813 Combined forms of age-related cataract, bilateral: Secondary | ICD-10-CM | POA: Diagnosis not present

## 2019-12-23 DIAGNOSIS — H43813 Vitreous degeneration, bilateral: Secondary | ICD-10-CM | POA: Diagnosis not present

## 2019-12-23 DIAGNOSIS — H0100B Unspecified blepharitis left eye, upper and lower eyelids: Secondary | ICD-10-CM | POA: Diagnosis not present

## 2019-12-31 DIAGNOSIS — R918 Other nonspecific abnormal finding of lung field: Secondary | ICD-10-CM | POA: Diagnosis not present

## 2019-12-31 DIAGNOSIS — R0600 Dyspnea, unspecified: Secondary | ICD-10-CM | POA: Diagnosis not present

## 2019-12-31 DIAGNOSIS — I517 Cardiomegaly: Secondary | ICD-10-CM | POA: Diagnosis not present

## 2019-12-31 DIAGNOSIS — Z01818 Encounter for other preprocedural examination: Secondary | ICD-10-CM | POA: Diagnosis not present

## 2020-01-14 DIAGNOSIS — D759 Disease of blood and blood-forming organs, unspecified: Secondary | ICD-10-CM | POA: Diagnosis not present

## 2020-01-14 DIAGNOSIS — K219 Gastro-esophageal reflux disease without esophagitis: Secondary | ICD-10-CM | POA: Diagnosis not present

## 2020-01-14 DIAGNOSIS — H527 Unspecified disorder of refraction: Secondary | ICD-10-CM | POA: Diagnosis not present

## 2020-01-14 DIAGNOSIS — J45909 Unspecified asthma, uncomplicated: Secondary | ICD-10-CM | POA: Diagnosis not present

## 2020-01-14 DIAGNOSIS — H25811 Combined forms of age-related cataract, right eye: Secondary | ICD-10-CM

## 2020-01-14 DIAGNOSIS — E785 Hyperlipidemia, unspecified: Secondary | ICD-10-CM | POA: Diagnosis not present

## 2020-01-14 DIAGNOSIS — H2181 Floppy iris syndrome: Secondary | ICD-10-CM | POA: Diagnosis not present

## 2020-01-14 DIAGNOSIS — H25813 Combined forms of age-related cataract, bilateral: Secondary | ICD-10-CM | POA: Diagnosis not present

## 2020-01-14 DIAGNOSIS — H0100B Unspecified blepharitis left eye, upper and lower eyelids: Secondary | ICD-10-CM | POA: Diagnosis not present

## 2020-01-14 DIAGNOSIS — H43813 Vitreous degeneration, bilateral: Secondary | ICD-10-CM | POA: Diagnosis not present

## 2020-01-14 DIAGNOSIS — H02055 Trichiasis without entropian left lower eyelid: Secondary | ICD-10-CM | POA: Diagnosis not present

## 2020-01-14 DIAGNOSIS — H0100A Unspecified blepharitis right eye, upper and lower eyelids: Secondary | ICD-10-CM | POA: Diagnosis not present

## 2020-01-14 DIAGNOSIS — H40003 Preglaucoma, unspecified, bilateral: Secondary | ICD-10-CM | POA: Diagnosis not present

## 2020-01-14 DIAGNOSIS — H52202 Unspecified astigmatism, left eye: Secondary | ICD-10-CM | POA: Diagnosis not present

## 2020-01-14 HISTORY — DX: Combined forms of age-related cataract, right eye: H25.811

## 2020-01-15 DIAGNOSIS — H25812 Combined forms of age-related cataract, left eye: Secondary | ICD-10-CM | POA: Diagnosis not present

## 2020-01-15 DIAGNOSIS — Z961 Presence of intraocular lens: Secondary | ICD-10-CM | POA: Diagnosis not present

## 2020-01-20 ENCOUNTER — Encounter: Payer: Self-pay | Admitting: Internal Medicine

## 2020-01-26 DIAGNOSIS — H25812 Combined forms of age-related cataract, left eye: Secondary | ICD-10-CM | POA: Insufficient documentation

## 2020-01-26 HISTORY — DX: Combined forms of age-related cataract, left eye: H25.812

## 2020-01-27 DIAGNOSIS — Z961 Presence of intraocular lens: Secondary | ICD-10-CM | POA: Diagnosis not present

## 2020-02-03 ENCOUNTER — Ambulatory Visit (INDEPENDENT_AMBULATORY_CARE_PROVIDER_SITE_OTHER): Payer: Medicare Other | Admitting: Internal Medicine

## 2020-02-03 ENCOUNTER — Encounter: Payer: Self-pay | Admitting: Internal Medicine

## 2020-02-03 ENCOUNTER — Other Ambulatory Visit: Payer: Self-pay

## 2020-02-03 DIAGNOSIS — I745 Embolism and thrombosis of iliac artery: Secondary | ICD-10-CM | POA: Diagnosis not present

## 2020-02-03 DIAGNOSIS — R0600 Dyspnea, unspecified: Secondary | ICD-10-CM

## 2020-02-03 DIAGNOSIS — R0609 Other forms of dyspnea: Secondary | ICD-10-CM | POA: Insufficient documentation

## 2020-02-03 HISTORY — DX: Other forms of dyspnea: R06.09

## 2020-02-03 LAB — CBC WITH DIFFERENTIAL/PLATELET
Basophils Absolute: 0.1 10*3/uL (ref 0.0–0.1)
Basophils Relative: 1 % (ref 0.0–3.0)
Eosinophils Absolute: 0.4 10*3/uL (ref 0.0–0.7)
Eosinophils Relative: 6.9 % — ABNORMAL HIGH (ref 0.0–5.0)
HCT: 39.7 % (ref 39.0–52.0)
Hemoglobin: 13.7 g/dL (ref 13.0–17.0)
Lymphocytes Relative: 38 % (ref 12.0–46.0)
Lymphs Abs: 2.4 10*3/uL (ref 0.7–4.0)
MCHC: 34.6 g/dL (ref 30.0–36.0)
MCV: 93.6 fl (ref 78.0–100.0)
Monocytes Absolute: 0.6 10*3/uL (ref 0.1–1.0)
Monocytes Relative: 9.1 % (ref 3.0–12.0)
Neutro Abs: 2.8 10*3/uL (ref 1.4–7.7)
Neutrophils Relative %: 45 % (ref 43.0–77.0)
Platelets: 205 10*3/uL (ref 150.0–400.0)
RBC: 4.24 Mil/uL (ref 4.22–5.81)
RDW: 13.1 % (ref 11.5–15.5)
WBC: 6.3 10*3/uL (ref 4.0–10.5)

## 2020-02-03 LAB — BASIC METABOLIC PANEL
BUN: 16 mg/dL (ref 6–23)
CO2: 27 mEq/L (ref 19–32)
Calcium: 9.9 mg/dL (ref 8.4–10.5)
Chloride: 102 mEq/L (ref 96–112)
Creatinine, Ser: 1.02 mg/dL (ref 0.40–1.50)
GFR: 71.07 mL/min (ref >60.00)
Glucose, Bld: 83 mg/dL (ref 70–99)
Potassium: 3.8 mEq/L (ref 3.5–5.1)
Sodium: 137 mEq/L (ref 135–145)

## 2020-02-03 LAB — SEDIMENTATION RATE: Sed Rate: 18 mm/hr (ref 0–20)

## 2020-02-03 LAB — TSH: TSH: 2.04 u[IU]/mL (ref 0.35–4.50)

## 2020-02-03 NOTE — Progress Notes (Signed)
[name], male    DOB: 1946/06/14,   MRN: [id number]   Brief patient profile:  40 yowm quit smoking 1992  Says had  asthma as child but out grew and felt like it came back in the 1990s and resolved p stopped smoking  With onset around 2000 rhinitis flare with mowing and then  Doe x summer 2020 so referred to pulmonary clinic 02/03/2020 by Dr   Shelia Media    History of Present Illness  02/03/2020  Pulmonary/ 1st office eval/[facility]  Chief Complaint  Patient presents with  . Consult  Dyspnea:  MMRC2 = can't walk a nl pace on a flat grade s sob but does fine slow and flat indolent onset gradually worse s cp or cough  Cough: none Sleep: flat/ no spells noct SABA use: no  As long as avoid mowing no nasal symptoms/ worse in fall though  No amio/ chemo/ collagen vasc hx or HSP hx   No obvious day to day or daytime variability or assoc excess/ purulent sputum or mucus plugs or hemoptysis or cp or chest tightness, subjective wheeze or overt sinus or hb symptoms.   Sleeping  without nocturnal  or early am exacerbation  of respiratory  c/o's or need for noct saba. Also denies any obvious fluctuation of symptoms with weather or environmental changes or other aggravating or alleviating factors except as outlined above   No unusual exposure hx or h/o childhood pna  or knowledge of premature birth.  Current Allergies, Complete Past Medical History, Past Surgical History, Family History, and Social History were reviewed in [employer] record.  ROS  The following are not active complaints unless bolded Hoarseness, sore throat, dysphagia, dental problems, itching, sneezing,  nasal congestion or discharge of excess mucus or purulent secretions, ear ache,   fever, chills, sweats, unintended wt loss or wt gain, classically pleuritic or exertional cp,  orthopnea pnd or arm/hand swelling  or leg swelling, presyncope, palpitations, abdominal pain, anorexia, nausea, vomiting, diarrhea  or  change in bowel habits or change in bladder habits, change in stools or change in urine, dysuria, hematuria,  rash, arthralgias, visual complaints, headache, numbness, weakness or ataxia or problems with walking or coordination,  change in mood or  memory.             Past Medical History:  Diagnosis Date  . Depression   . GERD (gastroesophageal reflux disease)   . Hyperlipidemia   . Osteopenia     Outpatient Medications Prior to Visit  Medication Sig Dispense Refill  . acetaminophen (TYLENOL) 500 MG tablet Take 500 mg by mouth every 6 (six) hours as needed for headache (pain).    . cholecalciferol (VITAMIN D) 25 MCG (1000 UT) tablet Take 1,000 Units by mouth at bedtime.     Marland Kitchen loratadine (CLARITIN) 10 MG tablet Take 10 mg by mouth daily. Patient used this medication for his allergies.    Marland Kitchen omeprazole (PRILOSEC OTC) 20 MG tablet Take 20 mg by mouth daily as needed (acid reflux).    . sertraline (ZOLOFT) 50 MG tablet Take 50 mg by mouth daily.    . calcium-vitamin D (OSCAL WITH D) 500-200 MG-UNIT tablet Take 1 tablet by mouth 2 (two) times daily for 10 days. 20 tablet 0   No facility-administered medications prior to visit.     Objective:     BP 128/72 (BP Location: Left Arm, Cuff Size: Normal)   Pulse 79   Temp 98.5 F (36.9  C) (Oral)   Ht 6' (1.829 m)   Wt 224 lb 6.4 oz (101.8 kg)   SpO2 94%   BMI 30.43 kg/m   SpO2: 94 %   Pleasant amb wm nad    HEENT : pt wearing mask not removed for exam due to covid -19 concerns.    NECK :  without JVD/Nodes/TM/ nl carotid upstrokes bilaterally   LUNGS: no acc muscle use,  Nl contour chest with mild crackles lower 1/3   bilaterally without cough on insp or exp maneuvers   CV:  RRR  no s3 or murmur or increase in P2, and no edema   ABD:  soft and nontender with nl inspiratory excursion in the supine position. No bruits or organomegaly appreciated, bowel sounds nl  MS:  Nl gait/ ext warm without deformities, calf tenderness,  cyanosis  No  Clubbing - No obvious joint restrictions   SKIN: warm and dry without lesions    NEURO:  alert, approp, nl sensorium with  no motor or cerebellar deficits apparent.     I personally reviewed images and agree with radiology impression as follows:  CXR:   01/10/20  CM / mild ild   bnp   01/01/20  112      Assessment   DOE (dyspnea on exertion) Onset summer 2020  -  02/03/2020   Walked RA  3 laps @ approx 256ft each @   fast pace  stopped due to end of study,   sats 94% at end and minimally sob - Collagen vascular profile 02/03/2020 >>>   - HSP serology 02/03/2020   - HRCT 02/03/2020   cxr suspicious for cm and ILD so despite reassuring bnp rec echo to look at RV functions   DDx for pulmonary fibrosis  includes idiopathic pulmonary fibrosis, pulmonary fibrosis associated with rheumatologic diseases (which have a relatively benign course in most cases) , adverse effect from  drugs such as chemotherapy or amiodarone exposure, nonspecific interstitial pneumonia which is typically steroid responsive, and chronic hypersensitivity pneumonitis.   In active  smokers Langerhan's Cell  Histiocyctosis (eosinophilic granuomatosis),  DIP,  and Respiratory Bronchiolitis ILD also need to be considered,  But should not apply here.   Most likely he has UIP or NSIP and HRCT is best clinical way to dx with serial sats with ex the best way to follow severity   In meantime, Use of PPI is associated with improved survival time and with decreased radiologic fibrosis per King's study published in [facility] vol 184 p1390.  Dec 2011 and also may have other beneficial effects as per the latest review in [place] vol 193 Z6109 Jun 20016.  This may not always be cause and effect, but given how universally underwhelming  (at least in terms of short term benefit)   and expensive  all the other  Drugs developed to day  have been for pf,   rec start  rx ppi / diet/ lifestyle modification for now while getting more  points on the curve / establish firm baseline before considering additional measures.   Discussed in detail all the  indications, usual  risks and alternatives  relative to the benefits with patient who agrees to proceed with rx  w/u as outlined.            Each maintenance medication was reviewed in detail including emphasizing most importantly the difference between maintenance and prns and under what circumstances the prns are to be triggered using an action plan format  where appropriate.  Total time for H and P, chart review, counseling,  directly observing portions of ambulatory 02 saturation study/  and generating customized AVS unique to this office visit / charting = 60 min           Christinia Gully, MD 02/03/2020

## 2020-02-03 NOTE — Assessment & Plan Note (Addendum)
Onset summer 2020  -  02/03/2020   Walked RA  3 laps @ approx 236ft each @   fast pace  stopped due to end of study,   sats 94% at end and minimally sob - Collagen vascular profile 02/03/2020 >>>   - HSP serology 02/03/2020   - HRCT 02/03/2020   cxr suspicious for cm and ILD so despite reassuring bnp rec echo to look at RV functions   DDx for pulmonary fibrosis  includes idiopathic pulmonary fibrosis, pulmonary fibrosis associated with rheumatologic diseases (which have a relatively benign course in most cases) , adverse effect from  drugs such as chemotherapy or amiodarone exposure, nonspecific interstitial pneumonia which is typically steroid responsive, and chronic hypersensitivity pneumonitis.   In active  smokers Langerhan's Cell  Histiocyctosis (eosinophilic granuomatosis),  DIP,  and Respiratory Bronchiolitis ILD also need to be considered,  But should not apply here.   Most likely he has UIP or NSIP and HRCT is best clinical way to dx with serial sats with ex the best way to follow severity   In meantime, Use of PPI is associated with improved survival time and with decreased radiologic fibrosis per King's study published in [facility] vol 184 p1390.  Dec 2011 and also may have other beneficial effects as per the latest review in [facility] vol 193 C7893 Jun 20016.  This may not always be cause and effect, but given how universally underwhelming  (at least in terms of short term benefit)   and expensive  all the other  Drugs developed to day  have been for pf,   rec start  rx ppi / diet/ lifestyle modification for now while getting more points on the curve / establish firm baseline before considering additional measures.   Discussed in detail all the  indications, usual  risks and alternatives  relative to the benefits with patient who agrees to proceed with rx  w/u as outlined.            Each maintenance medication was reviewed in detail including emphasizing most importantly the difference  between maintenance and prns and under what circumstances the prns are to be triggered using an action plan format where appropriate.  Total time for H and P, chart review, counseling,  directly observing portions of ambulatory 02 saturation study/  and generating customized AVS unique to this office visit / charting = 60 min

## 2020-02-03 NOTE — Patient Instructions (Addendum)
  Change your prilosec 20 mg Take 30-60 min before first meal of the day   GERD (REFLUX)  is an extremely common cause of respiratory symptoms just like yours , many times with no obvious heartburn at all.    It can be treated with medication, but also with lifestyle changes including elevation of the head of your bed (ideally with 6 -8inch blocks under the headboard of your bed),  Smoking cessation, avoidance of late meals, excessive alcohol, and avoid fatty foods, chocolate, peppermint, colas, red wine, and acidic juices such as orange juice.  NO MINT OR MENTHOL PRODUCTS SO NO COUGH DROPS  USE SUGARLESS CANDY INSTEAD (Jolley ranchers or Stover's or Life Savers) or even ice chips will also do - the key is to swallow to prevent all throat clearing. NO OIL BASED VITAMINS - use powdered substitutes.  Avoid fish oil when coughing.   We will call to set up a CT chest and Echo same day same place   Please remember to go to the lab department   for your tests - we will call you with all the results when they are available and go from there as far as what needs to be done next   In meantime : .Make sure you check your oxygen saturations at highest level of activity to be sure it stays over 90% and call if losing ground

## 2020-02-04 ENCOUNTER — Institutional Professional Consult (permissible substitution): Payer: Medicare Other | Admitting: Internal Medicine

## 2020-02-04 ENCOUNTER — Encounter: Payer: Self-pay | Admitting: Internal Medicine

## 2020-02-05 ENCOUNTER — Other Ambulatory Visit: Payer: Self-pay | Admitting: Internal Medicine

## 2020-02-05 ENCOUNTER — Ambulatory Visit (HOSPITAL_BASED_OUTPATIENT_CLINIC_OR_DEPARTMENT_OTHER)
Admission: RE | Admit: 2020-02-05 | Discharge: 2020-02-05 | Disposition: A | Discharge status: Home / Self Care | Payer: Medicare Other | Source: Ambulatory Visit | Attending: Internal Medicine | Admitting: Internal Medicine

## 2020-02-05 ENCOUNTER — Other Ambulatory Visit: Payer: Self-pay

## 2020-02-05 DIAGNOSIS — R0600 Dyspnea, unspecified: Secondary | ICD-10-CM | POA: Insufficient documentation

## 2020-02-05 DIAGNOSIS — R0609 Other forms of dyspnea: Secondary | ICD-10-CM

## 2020-02-05 NOTE — Progress Notes (Signed)
Spoke with the pt and appt's scheduled

## 2020-02-08 LAB — HYPERSENSITIVITY PNUEMONITIS PROFILE
ASPERGILLUS FUMIGATUS: NEGATIVE
Faenia retivirgula: NEGATIVE
Pigeon Serum: NEGATIVE
S. VIRIDIS: NEGATIVE
T. CANDIDUS: NEGATIVE
T. VULGARIS: NEGATIVE

## 2020-02-08 LAB — ANA: Anti Nuclear Antibody (ANA): NEGATIVE

## 2020-02-08 LAB — CYCLIC CITRUL PEPTIDE ANTIBODY, IGG: Cyclic Citrullin Peptide Ab: <16 UNITS

## 2020-02-08 LAB — IGE: IgE (Immunoglobulin E), Serum: 1344 kU/L — ABNORMAL HIGH (ref <=114)

## 2020-02-08 LAB — RHEUMATOID FACTOR: Rheumatoid fact SerPl-aCnc: <14 IU/mL (ref <14)

## 2020-02-18 ENCOUNTER — Other Ambulatory Visit: Payer: Self-pay

## 2020-02-18 ENCOUNTER — Ambulatory Visit (HOSPITAL_COMMUNITY): Payer: Medicare Other | Attending: Cardiology

## 2020-02-18 DIAGNOSIS — R0609 Other forms of dyspnea: Secondary | ICD-10-CM

## 2020-02-18 DIAGNOSIS — R0600 Dyspnea, unspecified: Secondary | ICD-10-CM

## 2020-04-11 ENCOUNTER — Ambulatory Visit (INDEPENDENT_AMBULATORY_CARE_PROVIDER_SITE_OTHER): Payer: Medicare Other | Admitting: Pulmonary Disease

## 2020-04-11 ENCOUNTER — Other Ambulatory Visit: Payer: Self-pay

## 2020-04-11 ENCOUNTER — Encounter: Payer: Self-pay | Admitting: Pulmonary Disease

## 2020-04-11 ENCOUNTER — Ambulatory Visit (INDEPENDENT_AMBULATORY_CARE_PROVIDER_SITE_OTHER): Payer: Medicare Other | Admitting: Internal Medicine

## 2020-04-11 VITALS — BP 120/80 | HR 78 | Temp 97.9°F | Ht 72.0 in | Wt 222.6 lb

## 2020-04-11 DIAGNOSIS — R0600 Dyspnea, unspecified: Secondary | ICD-10-CM

## 2020-04-11 DIAGNOSIS — I745 Embolism and thrombosis of iliac artery: Secondary | ICD-10-CM

## 2020-04-11 DIAGNOSIS — R0609 Other forms of dyspnea: Secondary | ICD-10-CM

## 2020-04-11 DIAGNOSIS — J849 Interstitial pulmonary disease, unspecified: Secondary | ICD-10-CM | POA: Diagnosis not present

## 2020-04-11 LAB — PULMONARY FUNCTION TEST
DL/VA % pred: 114 %
DL/VA: 4.53 ml/min/mmHg/L
DLCO cor % pred: 81 %
DLCO cor: 21.55 ml/min/mmHg
DLCO unc % pred: 81 %
DLCO unc: 21.55 ml/min/mmHg
FEF 25-75 Post: 2.91 L/s
FEF 25-75 Pre: 2.68 L/s
FEF2575-%Change-Post: 8 %
FEF2575-%Pred-Post: 122 %
FEF2575-%Pred-Pre: 113 %
FEV1-%Change-Post: 2 %
FEV1-%Pred-Post: 78 %
FEV1-%Pred-Pre: 76 %
FEV1-Post: 2.56 L
FEV1-Pre: 2.5 L
FEV1FVC-%Change-Post: 1 %
FEV1FVC-%Pred-Pre: 112 %
FEV6-%Change-Post: 0 %
FEV6-%Pred-Post: 72 %
FEV6-%Pred-Pre: 72 %
FEV6-Post: 3.05 L
FEV6-Pre: 3.05 L
FEV6FVC-%Change-Post: -1 %
FEV6FVC-%Pred-Post: 104 %
FEV6FVC-%Pred-Pre: 105 %
FVC-%Change-Post: 1 %
FVC-%Pred-Post: 69 %
FVC-%Pred-Pre: 68 %
FVC-Post: 3.1 L
FVC-Pre: 3.06 L
Post FEV1/FVC ratio: 83 %
Post FEV6/FVC ratio: 98 %
Pre FEV1/FVC ratio: 82 %
Pre FEV6/FVC Ratio: 100 %
RV % pred: 69 %
RV: 1.84 L
TLC % pred: 64 %
TLC: 4.75 L

## 2020-04-11 NOTE — Patient Instructions (Signed)
We will get some labs today called ILD panel to evaluate the scarring in the lung We will give you a questionnaire to check for exposures  Follow-up in 3 months.

## 2020-04-11 NOTE — Progress Notes (Signed)
[name]    [id number]    1946/05/30  Primary Care Physician:Pharr, Thayer Jew, MD  Referring Physician: Deland Pretty, MD [address] [place],  [place] [address]  Chief complaint: Consult for interstitial lung disease  HPI: 74 year old former smoker referred to pulmonary for evaluation of interstitial lung disease States that he has mild dyspnea on exertion.  No cough. Recently had a high-res CT and PFTs and is here for evaluation.  Previously evaluated by my partner Dr.  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 Exposures: Exposed to dust.  No asbestos exposure.  No mold, hot tub, Jacuzzi.  No down pillows or comforters Smoking history: 20-pack-year smoker.  Quit in the 1990s Travel history: No significant travel history Relevant family history: No significant family history of lung disease   Outpatient Encounter Medications as of 04/11/2020  Medication Sig  . acetaminophen (TYLENOL) 500 MG tablet Take 500 mg by mouth every 6 (six) hours as needed for headache (pain).  . cholecalciferol (VITAMIN D) 25 MCG (1000 UT) tablet Take 1,000 Units by mouth at bedtime.   Marland Kitchen loratadine (CLARITIN) 10 MG tablet Take 10 mg by mouth daily. Patient used this medication for his allergies.  Marland Kitchen omeprazole (PRILOSEC OTC) 20 MG tablet Take 20 mg by mouth daily as needed (acid reflux).  . sertraline (ZOLOFT) 50 MG tablet Take 50 mg by mouth daily.  . calcium-vitamin D (OSCAL WITH D) 500-200 MG-UNIT tablet Take 1 tablet by mouth 2 (two) times daily for 10 days.   No facility-administered encounter medications on file as of 04/11/2020.    Allergies as of 04/11/2020  . (No Known Allergies)    Past Medical History:  Diagnosis Date  . Depression   . GERD (gastroesophageal reflux disease)   . Hyperlipidemia   . Osteopenia     Past Surgical History:  Procedure Laterality Date  . FEMORAL-FEMORAL BYPASS GRAFT Left 01/13/2019   Procedure:  BYPASS GRAFT FEMORAL-FEMORAL ARTERY;  Surgeon: Angelia Mould, MD;  Location: [facility];  Service: Vascular;  Laterality: Left;  . LUMBAR LAMINECTOMY    . ORCHIECTOMY Left 01/12/2019   Procedure: LEFT INGUINAL RADICAL ORCHIECTOMY;  Surgeon: Lucas Mallow, MD;  Location: [facility];  Service: Urology;  Laterality: Left;  . ROTATOR CUFF REPAIR Right   . UMBILICAL HERNIA REPAIR      No family history on file.  Social History   Socioeconomic History  . Marital status: Married    Spouse name: Not on file  . Number of children: Not on file  . Years of education: Not on file  . Highest education level: Not on file  Occupational History  . Not on file  Tobacco Use  . Smoking status: Former Smoker    Packs/day: 1.00    Years: 28.00    Pack years: 28.00    Types: Cigarettes  . Smokeless tobacco: Never Used  . Tobacco comment: quit 1992  Vaping Use  . Vaping Use: Never used  Substance and Sexual Activity  . Alcohol use: Yes    Alcohol/week: 6.0 standard drinks    Types: 6 Cans of beer per week    Comment: beer occasional  . Drug use: Never  . Sexual activity: Not on file  Other Topics Concern  . Not on file  Social History Narrative  . Not on file   Social Determinants of Health   Financial Resource Strain:   .  Difficulty of Paying Living Expenses: Not on file  Food Insecurity:   . Worried About Charity fundraiser in the Last Year: Not on file  . Ran Out of Food in the Last Year: Not on file  Transportation Needs:   . Lack of Transportation (Medical): Not on file  . Lack of Transportation (Non-Medical): Not on file  Physical Activity:   . Days of Exercise per Week: Not on file  . Minutes of Exercise per Session: Not on file  Stress:   . Feeling of Stress : Not on file  Social Connections:   . Frequency of Communication with Friends and Family: Not on file  . Frequency of Social Gatherings with Friends and Family: Not on file  . Attends  Religious Services: Not on file  . Active Member of Clubs or Organizations: Not on file  . Attends Archivist Meetings: Not on file  . Marital Status: Not on file  Intimate Partner Violence:   . Fear of Current or Ex-Partner: Not on file  . Emotionally Abused: Not on file  . Physically Abused: Not on file  . Sexually Abused: Not on file    Review of systems: Review of Systems  Constitutional: Negative for fever and chills.  HENT: Negative.   Eyes: Negative for blurred vision.  Respiratory: as per HPI  Cardiovascular: Negative for chest pain and palpitations.  Gastrointestinal: Negative for vomiting, diarrhea, blood per rectum. Genitourinary: Negative for dysuria, urgency, frequency and hematuria.  Musculoskeletal: Negative for myalgias, back pain and joint pain.  Skin: Negative for itching and rash.  Neurological: Negative for dizziness, tremors, focal weakness, seizures and loss of consciousness.  Endo/Heme/Allergies: Negative for environmental allergies.  Psychiatric/Behavioral: Negative for depression, suicidal ideas and hallucinations.  All other systems reviewed and are negative.  Physical Exam: Blood pressure 120/80, pulse 78, temperature 97.9 F (36.6 C), temperature source Temporal, height 6' (1.829 m), weight 222 lb 9.6 oz (101 kg), SpO2 95 %. Gen:      No acute distress HEENT:  EOMI, sclera anicteric Neck:     No masses; no thyromegaly Lungs:    Clear to auscultation bilaterally; normal respiratory effort CV:         Regular rate and rhythm; no murmurs Abd:      + bowel sounds; soft, non-tender; no palpable masses, no distension Ext:    No edema; adequate peripheral perfusion Skin:      Warm and dry; no rash Neuro: alert and oriented x 3 Psych: normal mood and affect  Data Reviewed: Imaging: High-res CT 02/05/2020-groundglass attenuation with reticulation and traction bronchiectasis with basal gradient.  2 to 4 mm pulmonary nodules.  Probable UIP  pattern.  PFTs: 04/11/2020 FVC 3.10 [69%], FEV1 2.56 [78%], F/F 83, TLC 4.75 [64%], DLCO 21.55 [81%] Moderate restriction  Labs: IgE 02/02/2020-1344 Hypersensitivity panel 02/03/2020-negative ANA, CCP, rheumatoid factor 02/03/2020-negative  Assessment:  Pulmonary fibrosis CT reviewed with probable UIP pattern No known exposures or CTD symptoms  We will get full ILD panel including myositis for better evaluation.  Given ILD questionnaire for exposure history Discussed further work-up including biopsy but he would like to avoid dose Continue monitoring for progression and possible need for antifibrotic therapy.  Plan/Recommendations: ILD serologies, ILD questionnaire  Marshell Garfinkel MD [facility] 04/11/2020, 2:35 PM  CC: Deland Pretty, MD

## 2020-04-11 NOTE — Progress Notes (Signed)
PFT done today. 

## 2020-04-12 ENCOUNTER — Telehealth: Payer: Self-pay | Admitting: Pulmonary Disease

## 2020-04-12 NOTE — Telephone Encounter (Signed)
ILD packet received and hand delivered to Dr Vaughan Browner.

## 2020-04-13 LAB — ANA,IFA RA DIAG PNL W/RFLX TIT/PATN
Anti Nuclear Antibody (ANA): NEGATIVE
Cyclic Citrullin Peptide Ab: <16 UNITS
Rheumatoid fact SerPl-aCnc: <14 IU/mL (ref <14)

## 2020-04-13 LAB — ANCA SCREEN W REFLEX TITER: ANCA Screen: NEGATIVE

## 2020-04-13 LAB — ANTI-SCLERODERMA ANTIBODY: Scleroderma (Scl-70) (ENA) Antibody, IgG: <1 AI

## 2020-04-13 LAB — IGG, IGA, IGM
IgG (Immunoglobin G), Serum: 985 mg/dL (ref 600–1540)
IgM, Serum: 271 mg/dL (ref 50–300)
Immunoglobulin A: 362 mg/dL — ABNORMAL HIGH (ref 70–320)

## 2020-04-13 LAB — SJOGREN'S SYNDROME ANTIBODS(SSA + SSB)
SSA (Ro) (ENA) Antibody, IgG: <1 AI
SSB (La) (ENA) Antibody, IgG: <1 AI

## 2020-04-15 LAB — HYPERSENSITIVITY PNEUMONITIS
A. Pullulans Abs: NEGATIVE
A.Fumigatus #1 Abs: NEGATIVE
Micropolyspora faeni, IgG: NEGATIVE
Pigeon Serum Abs: NEGATIVE
Thermoact. Saccharii: NEGATIVE
Thermoactinomyces vulgaris, IgG: NEGATIVE

## 2020-04-27 LAB — MYOMARKER 3 PLUS PROFILE (RDL)

## 2020-07-12 ENCOUNTER — Encounter: Payer: Self-pay | Admitting: Pulmonary Disease

## 2020-07-12 ENCOUNTER — Other Ambulatory Visit: Payer: Self-pay

## 2020-07-12 ENCOUNTER — Ambulatory Visit (INDEPENDENT_AMBULATORY_CARE_PROVIDER_SITE_OTHER): Payer: Medicare Other | Admitting: Pulmonary Disease

## 2020-07-12 VITALS — BP 120/80 | HR 61 | Temp 97.8°F | Ht 72.0 in | Wt 226.8 lb

## 2020-07-12 DIAGNOSIS — Z23 Encounter for immunization: Secondary | ICD-10-CM | POA: Diagnosis not present

## 2020-07-12 DIAGNOSIS — Z0000 Encounter for general adult medical examination without abnormal findings: Secondary | ICD-10-CM

## 2020-07-12 DIAGNOSIS — J302 Other seasonal allergic rhinitis: Secondary | ICD-10-CM

## 2020-07-12 DIAGNOSIS — J849 Interstitial pulmonary disease, unspecified: Secondary | ICD-10-CM | POA: Insufficient documentation

## 2020-07-12 DIAGNOSIS — J309 Allergic rhinitis, unspecified: Secondary | ICD-10-CM

## 2020-07-12 HISTORY — DX: Allergic rhinitis, unspecified: J30.9

## 2020-07-12 HISTORY — DX: Interstitial pulmonary disease, unspecified: J84.9

## 2020-07-12 NOTE — Assessment & Plan Note (Signed)
Plan: Obtain COVID-19 booster High-dose flu vaccine today We will request immunization records from primary care to assess status of Pneumovax 23

## 2020-07-12 NOTE — Assessment & Plan Note (Addendum)
Reviewed high-resolution CT chest Reviewed August/2021 pulmonary function test Discussed interstitial lung disease Insured ILD questionnaire had been completed Reviewed connective tissue serologies  Discussed case with Dr. Vaughan Browner today.  Potentially this could be clinical IPF.  If there is progression of restriction on repeat spirometry with DLCO in February/March 2022 then may need to consider starting antifibrotic's  Plan: Remain physically active High-dose flu vaccine today We will check on status of Pneumovax 23 from primary care Spirometry with DLCO in February or March 2022 with follow-up with Dr. Vaughan Browner Repeat high-resolution CT chest in June/2022

## 2020-07-12 NOTE — Progress Notes (Signed)
@Patient  ID: [name], male    DOB: 06/29/1946, 74 y.o.   MRN: [id number]  Chief Complaint  Patient presents with  . Follow-up    dry cough increased     Referring provider: Deland Pretty, MD  HPI:  74 year old male former smoker followed in our office for interstitial lung disease  PMH: None iliac artery occlusion, allergic rhinitis Smoker/ Smoking History: Former smoker.  Quit 1992.  28-pack-year smoking history. Maintenance:   Pt of: Dr. Vaughan Browner   07/12/2020  - Visit   74 year old male former smoker followed in our office for interstitial lung disease.  He is established with Dr. Vaughan Browner.  Patient completing a 59-month follow-up with our office today.  He reports he continues to have an ongoing dry cough.  He reports that this is his baseline.  He remains very active.  He still basically working full-time.  His job requires physical activity.  Patient has completed connective tissue serologies that have been essentially negative.  He is also dropped off his ILD questionnaire for Dr. Vaughan Browner to review.  Although high-resolution CT chest from June/2021 reads as probable UIP this may be clinical IPF.  Patient has received the first 2 COVID-19 vaccinations.  He plans on obtaining the booster.  He has not received his seasonal flu vaccine yet.  He is unsure if he is ever received the Pneumovax 23 although he believes that he has.  We will follow up with primary care regarding this.  Questionaires / Pulmonary Flowsheets:   ACT:  No flowsheet data found.  MMRC: mMRC Dyspnea Scale mMRC Score  07/12/2020 0    Epworth:  No flowsheet data found.  Tests:   Imaging: High-res CT 02/05/2020-groundglass attenuation with reticulation and traction bronchiectasis with basal gradient.  2 to 4 mm pulmonary nodules.  Probable UIP pattern.  PFTs: 04/11/2020 FVC 3.10 [69%], FEV1 2.56 [78%], F/F 83, TLC 4.75 [64%], DLCO 21.55 [81%] Moderate restriction  Labs: IgE 02/02/2020-1344  Hypersensitivity panel 02/03/2020-negative ANA, CCP, rheumatoid factor 02/03/2020-negative  04/11/2020-connective tissue serologies-essentially negative  FENO:  No results found for: NITRICOXIDE  PFT: PFT Results Latest Ref Rng & Units 04/11/2020  FVC-Pre L 3.06  FVC-Predicted Pre % 68  FVC-Post L 3.10  FVC-Predicted Post % 69  Pre FEV1/FVC % % 82  Post FEV1/FCV % % 83  FEV1-Pre L 2.50  FEV1-Predicted Pre % 76  FEV1-Post L 2.56  DLCO uncorrected ml/min/mmHg 21.55  DLCO UNC% % 81  DLCO corrected ml/min/mmHg 21.55  DLCO COR %Predicted % 81  DLVA Predicted % 114  TLC L 4.75  TLC % Predicted % 64  RV % Predicted % 69    WALK:  SIX MIN WALK 02/03/2020  Supplimental Oxygen during Test? (L/min) No  Tech Comments: 1st two laps moderate pace and faster pace on 3rd lap, no stops or complaints of SOB    Imaging: No results found.  Lab Results:  CBC    Component Value Date/Time   WBC 6.3 02/03/2020 1447   RBC 4.24 02/03/2020 1447   HGB 13.7 02/03/2020 1447   HCT 39.7 02/03/2020 1447   PLT 205.0 02/03/2020 1447   MCV 93.6 02/03/2020 1447   MCH 32.6 02/02/2019 1520   MCHC 34.6 02/03/2020 1447   RDW 13.1 02/03/2020 1447   LYMPHSABS 2.4 02/03/2020 1447   MONOABS 0.6 02/03/2020 1447   EOSABS 0.4 02/03/2020 1447   BASOSABS 0.1 02/03/2020 1447    BMET    Component Value Date/Time   NA 137  02/03/2020 1447   K 3.8 02/03/2020 1447   CL 102 02/03/2020 1447   CO2 27 02/03/2020 1447   GLUCOSE 83 02/03/2020 1447   BUN 16 02/03/2020 1447   CREATININE 1.02 02/03/2020 1447   CALCIUM 9.9 02/03/2020 1447   GFRNONAA >60 02/02/2019 1520   GFRAA >60 02/02/2019 1520    BNP No results found for: BNP  ProBNP No results found for: PROBNP  Specialty Problems      Pulmonary Problems   DOE (dyspnea on exertion)    Onset summer 2020  -  02/03/2020   Walked RA  3 laps @ approx 279ft each @   fast pace  stopped due to end of study,    sats 94% at end and minimally sob - Collagen  vascular profile 02/03/2020 >>>  Neg  Lab Results  Component Value Date   ESRSEDRATE 18 02/03/2020  - Allergy profile 02/03/20 >  Eos 0.4 /  IgE  1344  - HSP serology 02/03/2020  Neg  - HRCT 02/05/2020  compatible with interstitial lung disease, with a spectrum of findings considered probable UIP    - Echo 02/18/20 :  1. Left ventricular ejection fraction, by estimation, is 60 to 65%. The left ventricle has normal function. The left ventricle has no regional wall motion abnormalities. Left ventricular diastolic parameters are consistent with Grade I diastolic  dysfunction (impaired relaxation).  2. Right ventricular systolic function is normal. The right ventricular size is normal. There is normal pulmonary artery systolic pressure.  3. The mitral valve is normal in structure. Mild mitral valve regurgitation. No evidence of mitral stenosis.  4. The aortic valve is tricuspid. Aortic valve regurgitation is not visualized. Mild aortic valve sclerosis is present, with no evidence of aortic valve stenosis.  5. Aortic dilatation noted. There is mild dilatation of the aortic root measuring 39 mm.  6. The inferior vena cava is normal in size with greater than 50% respiratory variability, suggesting right atrial pressure of 3 mmHg.      Allergic rhinitis   ILD (interstitial lung disease) ([place])      No Known Allergies  Immunization History  Administered Date(s) Administered  . Fluad Quad(high Dose 65+) 05/13/2019, 07/12/2020  . Influenza-Unspecified 06/13/2018  . PFIZER SARS-COV-2 Vaccination 09/26/2019, 10/18/2019    Past Medical History:  Diagnosis Date  . Depression   . GERD (gastroesophageal reflux disease)   . Hyperlipidemia   . Osteopenia     Tobacco History: Social History   Tobacco Use  Smoking Status Former Smoker  . Packs/day: 1.00  . Years: 28.00  . Pack years: 28.00  . Types: Cigarettes  Smokeless Tobacco Never Used  Tobacco Comment   quit 1992   Counseling given: Not  Answered Comment: quit 1992   Continue to not smoke  Outpatient Encounter Medications as of 07/12/2020  Medication Sig  . acetaminophen (TYLENOL) 500 MG tablet Take 500 mg by mouth every 6 (six) hours as needed for headache (pain).  . cholecalciferol (VITAMIN D) 25 MCG (1000 UT) tablet Take 1,000 Units by mouth at bedtime.   Marland Kitchen loratadine (CLARITIN) 10 MG tablet Take 10 mg by mouth daily. Patient used this medication for his allergies.  Marland Kitchen omeprazole (PRILOSEC OTC) 20 MG tablet Take 20 mg by mouth daily as needed (acid reflux).  . sertraline (ZOLOFT) 50 MG tablet Take 50 mg by mouth daily.  . calcium-vitamin D (OSCAL WITH D) 500-200 MG-UNIT tablet Take 1 tablet by mouth 2 (two) times daily for  10 days.   No facility-administered encounter medications on file as of 07/12/2020.     Review of Systems  Review of Systems  Constitutional: Negative for activity change, chills, fatigue, fever and unexpected weight change.  HENT: Negative for postnasal drip, rhinorrhea, sinus pressure, sinus pain and sore throat.   Eyes: Negative.   Respiratory: Positive for cough (at baseline). Negative for shortness of breath and wheezing.   Cardiovascular: Negative for chest pain and palpitations.  Gastrointestinal: Negative for constipation, diarrhea, nausea and vomiting.  Endocrine: Negative.   Genitourinary: Negative.   Musculoskeletal: Negative.   Skin: Negative.   Neurological: Negative for dizziness and headaches.  Psychiatric/Behavioral: Negative.  Negative for dysphoric mood. The patient is not nervous/anxious.   All other systems reviewed and are negative.    Physical Exam  BP 120/80 (BP Location: Left Arm, Cuff Size: Normal)   Pulse 61   Temp 97.8 F (36.6 C)   Ht 6' (1.829 m)   Wt 226 lb 12.8 oz (102.9 kg)   SpO2 97%   BMI 30.76 kg/m   Wt Readings from Last 5 Encounters:  07/12/20 226 lb 12.8 oz (102.9 kg)  04/11/20 222 lb 9.6 oz (101 kg)  02/03/20 224 lb 6.4 oz (101.8 kg)   09/22/19 220 lb (99.8 kg)  02/04/19 220 lb (99.8 kg)    BMI Readings from Last 5 Encounters:  07/12/20 30.76 kg/m  04/11/20 30.19 kg/m  02/03/20 30.43 kg/m  09/22/19 29.84 kg/m  02/04/19 29.84 kg/m     Physical Exam Vitals and nursing note reviewed.  Constitutional:      General: He is not in acute distress.    Appearance: Normal appearance.  HENT:     Head: Normocephalic and atraumatic.     Right Ear: Hearing and external ear normal.     Left Ear: Hearing and external ear normal.     Nose: Congestion (clear) and rhinorrhea present. No mucosal edema.     Right Turbinates: Not enlarged.     Left Turbinates: Not enlarged.     Mouth/Throat:     Mouth: Mucous membranes are dry.     Pharynx: Oropharynx is clear. No oropharyngeal exudate.     Comments: +PND Eyes:     Pupils: Pupils are equal, round, and reactive to light.  Cardiovascular:     Rate and Rhythm: Normal rate and regular rhythm.     Pulses: Normal pulses.     Heart sounds: Normal heart sounds. No murmur heard.   Pulmonary:     Effort: Pulmonary effort is normal.     Breath sounds: Rales present. No decreased breath sounds or wheezing.  Musculoskeletal:     Cervical back: Normal range of motion.     Right lower leg: No edema.     Left lower leg: No edema.  Lymphadenopathy:     Cervical: No cervical adenopathy.  Skin:    General: Skin is warm and dry.     Capillary Refill: Capillary refill takes less than 2 seconds.     Findings: No erythema or rash.  Neurological:     General: No focal deficit present.     Mental Status: He is alert and oriented to person, place, and time.     Motor: No weakness.     Coordination: Coordination normal.     Gait: Gait is intact. Gait normal.  Psychiatric:        Mood and Affect: Mood normal.        Behavior: Behavior  normal. Behavior is cooperative.        Thought Content: Thought content normal.        Judgment: Judgment normal.       Assessment & Plan:    ILD (interstitial lung disease) ([place]) Reviewed high-resolution CT chest Reviewed August/2021 pulmonary function test Discussed interstitial lung disease Insured ILD questionnaire had been completed Reviewed connective tissue serologies  Discussed case with Dr. Vaughan Browner today.  Potentially this could be clinical IPF.  If there is progression of restriction on repeat spirometry with DLCO in February/March 2022 then may need to consider starting antifibrotic's  Plan: Remain physically active High-dose flu vaccine today We will check on status of Pneumovax 23 from primary care Spirometry with DLCO in February or March 2022 with follow-up with Dr. Vaughan Browner Repeat high-resolution CT chest in [place] maintenance Plan: Obtain COVID-19 booster High-dose flu vaccine today We will request immunization records from primary care to assess status of Pneumovax 23  Allergic rhinitis Rhinorrhea and postnasal drip on exam  Plan: Continue Claritin Can start chlor tabs Encourage patient to utilize nasal saline rinses especially if he is around a significant amount of dust with his work    Return in about 3 months (around 10/10/2020), or if symptoms worsen or fail to improve, for Follow up with Dr. Vaughan Browner, Follow up for PFT - 45min, Spiro with DLCO.   Lauraine Rinne, NP 07/12/2020   This appointment required 34 minutes of patient care (this includes precharting, chart review, review of results, face-to-face care, etc.).

## 2020-07-12 NOTE — Patient Instructions (Addendum)
You were seen today by Lauraine Rinne, NP  for:   1. ILD (interstitial lung disease) ([place])  - Pulmonary function test; Future  We will obtain a pulmonary function test on you in February or March 2022 with follow-up with our office  Continue to remain physically active  2. Seasonal allergic rhinitis, unspecified trigger  Continue daily Claritin  Please start taking chlorpheniramine (aka Chlor tabs) 4 mg tablet (1 to 2 tablets at night) for management of allergies and postnasal drip at night >>> This is an over-the-counter medication >>> This medication is sedating  3. Healthcare maintenance   High-dose flu vaccine today  We will request records from primary care to see if you have ever had the Pneumovax 23  Obtain COVID-19 booster  We recommend today:  Orders Placed This Encounter  Procedures  . Flu Vaccine QUAD High Dose(Fluad)  . Pulmonary function test    Arlyce Harman with dlco    Standing Status:   Future    Standing Expiration Date:   07/12/2021    Scheduling Instructions:     End of feb/2022 beginning of march 2022    Order Specific Question:   Where should this test be performed?    Answer:   [facility]   Orders Placed This Encounter  Procedures  . Flu Vaccine QUAD High Dose(Fluad)  . Pulmonary function test   No orders of the defined types were placed in this encounter.   Follow Up:    Return in about 3 months (around 10/10/2020), or if symptoms worsen or fail to improve, for Follow up with Dr. Vaughan Browner, Follow up for PFT - 43min, [place] with [facility].   Notification of test results are managed in the following manner: If there are  any recommendations or changes to the  plan of care discussed in office today,  we will contact you and let you know what they are. If you do not hear from [place], then your results are normal and you can view them through your  MyChart account , or a letter will be sent to you. Thank you again for trusting [place] with your care  - Thank you,  [facility]    It is flu season:   >>> Best ways to protect herself from the flu: Receive the yearly flu vaccine, practice good hand hygiene washing with soap and also using hand sanitizer when available, eat a nutritious meals, get adequate rest, hydrate appropriately       Please contact the office if your symptoms worsen or you have concerns that you are not improving.   Thank you for choosing  [facility] for your healthcare, and for allowing [place] to partner with you on your healthcare journey. I am thankful to be able to provide care to you today.   Wyn Quaker FNP-C  Influenza Virus Vaccine injection What is this medicine? INFLUENZA VIRUS VACCINE (in floo EN zuh VAHY ruhs vak SEEN) helps to reduce the risk of getting influenza also known as the flu. The vaccine only helps protect you against some strains of the flu. This medicine may be used for other purposes; ask your health care provider or pharmacist if you have questions. COMMON BRAND NAME(S): Afluria, Afluria Quadrivalent, Agriflu, Alfuria, FLUAD, Fluarix, Fluarix Quadrivalent, Flublok, Flublok Quadrivalent, FLUCELVAX, FLUCELVAX Quadrivalent, Flulaval, Flulaval Quadrivalent, Fluvirin, Fluzone, Fluzone High-Dose, Fluzone Intradermal, Fluzone Quadrivalent What should I tell my health care provider before I take this medicine? They need to know if you have any of these  conditions:  bleeding disorder like hemophilia  fever or infection  Guillain-Barre syndrome or other neurological problems  immune system problems  infection with the human immunodeficiency virus (HIV) or AIDS  low blood platelet counts  multiple sclerosis  an unusual or allergic reaction to influenza virus vaccine, latex, other medicines, foods, dyes, or preservatives. Different brands of vaccines contain different allergens. Some may contain latex or eggs. Talk to your doctor about your allergies to make sure that you get the right  vaccine.  pregnant or trying to get pregnant  breast-feeding How should I use this medicine? This vaccine is for injection into a muscle or under the skin. It is given by a health care professional. A copy of Vaccine Information Statements will be given before each vaccination. Read this sheet carefully each time. The sheet may change frequently. Talk to your healthcare provider to see which vaccines are right for you. Some vaccines should not be used in all age groups. Overdosage: If you think you have taken too much of this medicine contact a poison control center or emergency room at once. NOTE: This medicine is only for you. Do not share this medicine with others. What if I miss a dose? This does not apply. What may interact with this medicine?  chemotherapy or radiation therapy  medicines that lower your immune system like etanercept, anakinra, infliximab, and adalimumab  medicines that treat or prevent blood clots like warfarin  phenytoin  steroid medicines like prednisone or cortisone  theophylline  vaccines This list may not describe all possible interactions. Give your health care provider a list of all the medicines, herbs, non-prescription drugs, or dietary supplements you use. Also tell them if you smoke, drink alcohol, or use illegal drugs. Some items may interact with your medicine. What should I watch for while using this medicine? Report any side effects that do not go away within 3 days to your doctor or health care professional. Call your health care provider if any unusual symptoms occur within 6 weeks of receiving this vaccine. You may still catch the flu, but the illness is not usually as bad. You cannot get the flu from the vaccine. The vaccine will not protect against colds or other illnesses that may cause fever. The vaccine is needed every year. What side effects may I notice from receiving this medicine? Side effects that you should report to your doctor or  health care professional as soon as possible:  allergic reactions like skin rash, itching or hives, swelling of the face, lips, or tongue Side effects that usually do not require medical attention (report to your doctor or health care professional if they continue or are bothersome):  fever  headache  muscle aches and pains  pain, tenderness, redness, or swelling at the injection site  tiredness This list may not describe all possible side effects. Call your doctor for medical advice about side effects. You may report side effects to FDA at 1-800-FDA-1088. Where should I keep my medicine? The vaccine will be given by a health care professional in a clinic, pharmacy, doctor's office, or other health care setting. You will not be given vaccine doses to store at home. NOTE: This sheet is a summary. It may not cover all possible information. If you have questions about this medicine, talk to your doctor, pharmacist, or health care provider.  2020 Elsevier/Gold Standard (2018-06-24 08:45:43)

## 2020-07-12 NOTE — Assessment & Plan Note (Signed)
Rhinorrhea and postnasal drip on exam  Plan: Continue Claritin Can start chlor tabs Encourage patient to utilize nasal saline rinses especially if he is around a significant amount of dust with his work

## 2020-08-25 DIAGNOSIS — Z125 Encounter for screening for malignant neoplasm of prostate: Secondary | ICD-10-CM | POA: Diagnosis not present

## 2020-08-25 DIAGNOSIS — E785 Hyperlipidemia, unspecified: Secondary | ICD-10-CM | POA: Diagnosis not present

## 2020-09-13 ENCOUNTER — Ambulatory Visit: Payer: Medicare Other | Attending: Internal Medicine

## 2020-09-13 ENCOUNTER — Other Ambulatory Visit (HOSPITAL_BASED_OUTPATIENT_CLINIC_OR_DEPARTMENT_OTHER): Payer: Self-pay | Admitting: Internal Medicine

## 2020-09-13 DIAGNOSIS — Z23 Encounter for immunization: Secondary | ICD-10-CM

## 2020-09-13 MED FILL — PFIZER-BIONTECH COVID-19 VA: 30 | 21 days supply | Qty: 0 | Fill #0

## 2020-09-13 NOTE — Progress Notes (Signed)
   Covid-19 Vaccination Clinic  Name:  [name]    MRN: [id number] DOB: 1946/03/19  09/13/2020  [name] was observed post Covid-19 immunization for 15 minutes without incident. He was provided with Vaccine Information Sheet and instruction to access the V-Safe system. Vaccinated by [employer].  [name] was instructed to call 911 with any severe reactions post vaccine: Marland Kitchen Difficulty breathing  . Swelling of face and throat  . A fast heartbeat  . A bad rash all over body  . Dizziness and weakness   Immunizations Administered    Name Date Dose VIS Date Route   PFIZER Comrnaty(Gray TOP) Covid-19 Vaccine 09/13/2020 10:38 AM 0.3 mL 07/21/2020 Intramuscular   Manufacturer: [place]   Lot: [id number]   NDC: [phone]

## 2020-09-14 DIAGNOSIS — I739 Peripheral vascular disease, unspecified: Secondary | ICD-10-CM | POA: Diagnosis not present

## 2020-09-14 DIAGNOSIS — Z6830 Body mass index (BMI) 30.0-30.9, adult: Secondary | ICD-10-CM | POA: Diagnosis not present

## 2020-09-14 DIAGNOSIS — Z0000 Encounter for general adult medical examination without abnormal findings: Secondary | ICD-10-CM | POA: Diagnosis not present

## 2020-09-14 DIAGNOSIS — R194 Change in bowel habit: Secondary | ICD-10-CM | POA: Diagnosis not present

## 2020-09-14 DIAGNOSIS — E785 Hyperlipidemia, unspecified: Secondary | ICD-10-CM | POA: Diagnosis not present

## 2020-09-14 DIAGNOSIS — L578 Other skin changes due to chronic exposure to nonionizing radiation: Secondary | ICD-10-CM | POA: Diagnosis not present

## 2020-09-14 DIAGNOSIS — M8580 Other specified disorders of bone density and structure, unspecified site: Secondary | ICD-10-CM | POA: Diagnosis not present

## 2020-09-14 DIAGNOSIS — J849 Interstitial pulmonary disease, unspecified: Secondary | ICD-10-CM | POA: Diagnosis not present

## 2020-09-14 DIAGNOSIS — F411 Generalized anxiety disorder: Secondary | ICD-10-CM | POA: Diagnosis not present

## 2020-09-14 DIAGNOSIS — K219 Gastro-esophageal reflux disease without esophagitis: Secondary | ICD-10-CM | POA: Diagnosis not present

## 2020-09-14 DIAGNOSIS — I700 Atherosclerosis of aorta: Secondary | ICD-10-CM | POA: Diagnosis not present

## 2020-09-15 DIAGNOSIS — R972 Elevated prostate specific antigen [PSA]: Secondary | ICD-10-CM | POA: Diagnosis not present

## 2020-09-26 ENCOUNTER — Other Ambulatory Visit (HOSPITAL_COMMUNITY): Payer: Medicare Other

## 2020-09-29 ENCOUNTER — Ambulatory Visit: Payer: Medicare Other | Admitting: Pulmonary Disease

## 2020-10-26 ENCOUNTER — Emergency Department (HOSPITAL_BASED_OUTPATIENT_CLINIC_OR_DEPARTMENT_OTHER): Payer: Medicare Other

## 2020-10-26 ENCOUNTER — Encounter (HOSPITAL_BASED_OUTPATIENT_CLINIC_OR_DEPARTMENT_OTHER): Payer: Self-pay | Admitting: Emergency Medicine

## 2020-10-26 ENCOUNTER — Other Ambulatory Visit: Payer: Self-pay

## 2020-10-26 ENCOUNTER — Inpatient Hospital Stay (HOSPITAL_BASED_OUTPATIENT_CLINIC_OR_DEPARTMENT_OTHER)
Admission: EM | Admit: 2020-10-26 | Discharge: 2020-10-29 | DRG: 389 | Disposition: A | Discharge status: Home / Self Care | Payer: Medicare Other | Attending: Internal Medicine | Admitting: Internal Medicine

## 2020-10-26 DIAGNOSIS — N182 Chronic kidney disease, stage 2 (mild): Secondary | ICD-10-CM | POA: Diagnosis present

## 2020-10-26 DIAGNOSIS — Z20822 Contact with and (suspected) exposure to covid-19: Secondary | ICD-10-CM | POA: Diagnosis present

## 2020-10-26 DIAGNOSIS — K56600 Partial intestinal obstruction, unspecified as to cause: Secondary | ICD-10-CM | POA: Diagnosis not present

## 2020-10-26 DIAGNOSIS — Z87891 Personal history of nicotine dependence: Secondary | ICD-10-CM

## 2020-10-26 DIAGNOSIS — J849 Interstitial pulmonary disease, unspecified: Secondary | ICD-10-CM | POA: Diagnosis not present

## 2020-10-26 DIAGNOSIS — E785 Hyperlipidemia, unspecified: Secondary | ICD-10-CM | POA: Diagnosis present

## 2020-10-26 DIAGNOSIS — Z7982 Long term (current) use of aspirin: Secondary | ICD-10-CM

## 2020-10-26 DIAGNOSIS — Z0189 Encounter for other specified special examinations: Secondary | ICD-10-CM

## 2020-10-26 DIAGNOSIS — R55 Syncope and collapse: Secondary | ICD-10-CM | POA: Diagnosis not present

## 2020-10-26 DIAGNOSIS — I10 Essential (primary) hypertension: Secondary | ICD-10-CM | POA: Diagnosis not present

## 2020-10-26 DIAGNOSIS — K439 Ventral hernia without obstruction or gangrene: Secondary | ICD-10-CM | POA: Diagnosis present

## 2020-10-26 DIAGNOSIS — K219 Gastro-esophageal reflux disease without esophagitis: Secondary | ICD-10-CM | POA: Diagnosis present

## 2020-10-26 DIAGNOSIS — I700 Atherosclerosis of aorta: Secondary | ICD-10-CM | POA: Diagnosis not present

## 2020-10-26 DIAGNOSIS — D631 Anemia in chronic kidney disease: Secondary | ICD-10-CM | POA: Diagnosis present

## 2020-10-26 DIAGNOSIS — Z79899 Other long term (current) drug therapy: Secondary | ICD-10-CM

## 2020-10-26 DIAGNOSIS — E860 Dehydration: Secondary | ICD-10-CM | POA: Diagnosis not present

## 2020-10-26 DIAGNOSIS — R109 Unspecified abdominal pain: Secondary | ICD-10-CM | POA: Diagnosis not present

## 2020-10-26 DIAGNOSIS — I739 Peripheral vascular disease, unspecified: Secondary | ICD-10-CM | POA: Diagnosis present

## 2020-10-26 DIAGNOSIS — K56609 Unspecified intestinal obstruction, unspecified as to partial versus complete obstruction: Secondary | ICD-10-CM | POA: Diagnosis present

## 2020-10-26 DIAGNOSIS — S0990XA Unspecified injury of head, initial encounter: Secondary | ICD-10-CM | POA: Diagnosis not present

## 2020-10-26 DIAGNOSIS — J3489 Other specified disorders of nose and nasal sinuses: Secondary | ICD-10-CM | POA: Diagnosis not present

## 2020-10-26 DIAGNOSIS — F32A Depression, unspecified: Secondary | ICD-10-CM | POA: Diagnosis present

## 2020-10-26 DIAGNOSIS — M8580 Other specified disorders of bone density and structure, unspecified site: Secondary | ICD-10-CM | POA: Diagnosis present

## 2020-10-26 HISTORY — DX: Unspecified intestinal obstruction, unspecified as to partial versus complete obstruction: K56.609

## 2020-10-26 LAB — BASIC METABOLIC PANEL
Anion gap: 11 (ref 5–15)
BUN: 28 mg/dL — ABNORMAL HIGH (ref 8–23)
CO2: 26 mmol/L (ref 22–32)
Calcium: 9.4 mg/dL (ref 8.9–10.3)
Chloride: 99 mmol/L (ref 98–111)
Creatinine, Ser: 1.33 mg/dL — ABNORMAL HIGH (ref 0.61–1.24)
GFR, Estimated: 55 mL/min — ABNORMAL LOW (ref >60)
Glucose, Bld: 125 mg/dL — ABNORMAL HIGH (ref 70–99)
Potassium: 4.1 mmol/L (ref 3.5–5.1)
Sodium: 136 mmol/L (ref 135–145)

## 2020-10-26 LAB — CBG MONITORING, ED: Glucose-Capillary: 110 mg/dL — ABNORMAL HIGH (ref 70–99)

## 2020-10-26 LAB — CBC
HCT: 44.5 % (ref 39.0–52.0)
Hemoglobin: 15.2 g/dL (ref 13.0–17.0)
MCH: 31.9 pg (ref 26.0–34.0)
MCHC: 34.2 g/dL (ref 30.0–36.0)
MCV: 93.3 fL (ref 80.0–100.0)
Platelets: 215 10*3/uL (ref 150–400)
RBC: 4.77 MIL/uL (ref 4.22–5.81)
RDW: 13.1 % (ref 11.5–15.5)
WBC: 6.7 10*3/uL (ref 4.0–10.5)
nRBC: 0 % (ref 0.0–0.2)

## 2020-10-26 LAB — RESP PANEL BY RT-PCR (FLU A&B, COVID) ARPGX2
Influenza A by PCR: NEGATIVE
Influenza B by PCR: NEGATIVE
SARS Coronavirus 2 by RT PCR: NEGATIVE

## 2020-10-26 MED ORDER — ONDANSETRON HCL 4 MG/2ML IJ SOLN
4.0000 mg | Freq: Once | INTRAMUSCULAR | Status: AC
  Administered 2020-10-26: 4 mg via INTRAVENOUS
  Filled 2020-10-26: qty 2

## 2020-10-26 MED ORDER — FENTANYL CITRATE (PF) 100 MCG/2ML IJ SOLN
50.0000 ug | Freq: Once | INTRAMUSCULAR | Status: AC
Start: 2020-10-26 — End: 2020-10-26
  Administered 2020-10-26: 50 ug via INTRAVENOUS
  Filled 2020-10-26: qty 2

## 2020-10-26 MED ORDER — IOHEXOL 300 MG/ML  SOLN
100.0000 mL | Freq: Once | INTRAMUSCULAR | Status: AC | PRN
  Administered 2020-10-26: 100 mL via INTRAVENOUS

## 2020-10-26 MED ORDER — SODIUM CHLORIDE 0.9 % IV BOLUS
1000.0000 mL | Freq: Once | INTRAVENOUS | Status: AC
  Administered 2020-10-26: 1000 mL via INTRAVENOUS

## 2020-10-26 NOTE — ED Triage Notes (Signed)
Reports diarrhea , emesis and abd pain x 1 day , had syncope episode in the restroom, hit his head , occipital hematoma. Lower back. landed backward. Alert and oriented x 4. No daily aspirin 81 mg

## 2020-10-26 NOTE — ED Provider Notes (Signed)
[facility] EMERGENCY DEPARTMENT Provider Note   CSN: [id number] Arrival date & time: 10/26/20  1823     History Chief Complaint  Patient presents with  . Abdominal Pain  . Loss of Consciousness  . Emesis    Jemarcus RAKIN LEMELLE is a 75 y.o. male with past medical history of acid reflux, hyperlipidemia presents to the emergency department today for nausea, vomiting diarrhea that started around 2 AM this morning.  Patient states that he was in his normal health yesterday, started have some nausea vomiting that persisted till this evening.  Patient states that he is only had a couple episodes of diarrhea, no blood in his stool.  No hematemesis.  Patient states that around 5 PM this afternoon he was trying to use the restroom, stood up really fast and had an syncopal episode.  States that he did pass out in the bathroom, was able to come back to baseline.  Patient states that he remembers everything before and after this event.  Patient states that he was feeling slightly dizzy prior to this.  Not feeling dizzy currently.  Does feel nauseous and is having abdominal pain.  States prior to this he was having constipation a couple days ago.  Abdominal pain does not radiate anywhere.  Abdominal surgeries include hernia repair.  Denies any fevers or chills, no sick contacts.  HPI     Past Medical History:  Diagnosis Date  . Depression   . GERD (gastroesophageal reflux disease)   . Hyperlipidemia   . Osteopenia     Patient Active Problem List   Diagnosis Date Noted  . SBO (small bowel obstruction) ([place]) 10/26/2020  . Allergic rhinitis 07/12/2020  . ILD (interstitial lung disease) ([place]) 07/12/2020  . Healthcare maintenance 07/12/2020  . DOE (dyspnea on exertion) 02/03/2020  . Iliac artery occlusion, left ([place]) 01/13/2019  . Right knee pain 03/02/2015    Past Surgical History:  Procedure Laterality Date  . FEMORAL-FEMORAL BYPASS GRAFT Left 01/13/2019   Procedure: BYPASS GRAFT  FEMORAL-FEMORAL ARTERY;  Surgeon: Angelia Mould, MD;  Location: [facility];  Service: Vascular;  Laterality: Left;  . LUMBAR LAMINECTOMY    . ORCHIECTOMY Left 01/12/2019   Procedure: LEFT INGUINAL RADICAL ORCHIECTOMY;  Surgeon: Lucas Mallow, MD;  Location: [facility];  Service: Urology;  Laterality: Left;  . ROTATOR CUFF REPAIR Right   . UMBILICAL HERNIA REPAIR         No family history on file.  Social History   Tobacco Use  . Smoking status: Former Smoker    Packs/day: 1.00    Years: 28.00    Pack years: 28.00    Types: Cigarettes  . Smokeless tobacco: Never Used  . Tobacco comment: quit 1992  Vaping Use  . Vaping Use: Never used  Substance Use Topics  . Alcohol use: Yes    Alcohol/week: 6.0 standard drinks    Types: 6 Cans of beer per week    Comment: beer occasional  . Drug use: Never    Home Medications Prior to Admission medications   Medication Sig Start Date End Date Taking? Authorizing Provider  acetaminophen (TYLENOL) 500 MG tablet Take 500 mg by mouth every 6 (six) hours as needed for headache (pain).    [provider]  calcium-vitamin D (OSCAL WITH D) 500-200 MG-UNIT tablet Take 1 tablet by mouth 2 (two) times daily for 10 days. 02/02/19 02/12/19  Mesner, Corene Cornea, MD  cholecalciferol (VITAMIN D) 25 MCG (1000 UT)  tablet Take 1,000 Units by mouth at bedtime.     [provider]  loratadine (CLARITIN) 10 MG tablet Take 10 mg by mouth daily. Patient used this medication for his allergies.    [provider]  omeprazole (PRILOSEC OTC) 20 MG tablet Take 20 mg by mouth daily as needed (acid reflux).    [provider]  omeprazole (PRILOSEC) 10 MG capsule TAKE 1 CAPSULE BY MOUTH EVERY DAY 30 MINUTES BEFORE BREAKFAST 09/04/20   [provider]  rosuvastatin (CRESTOR) 10 MG tablet Take 10 mg by mouth daily. 09/27/20   [provider]  sertraline (ZOLOFT) 50 MG tablet Take 50 mg by mouth daily.     [provider]    Allergies    Patient has no known allergies.  Review of Systems   Review of Systems  Constitutional: Negative for chills, diaphoresis, fatigue and fever.  HENT: Negative for congestion, sore throat and trouble swallowing.   Eyes: Negative for pain and visual disturbance.  Respiratory: Negative for cough, shortness of breath and wheezing.   Cardiovascular: Negative for chest pain, palpitations and leg swelling.  Gastrointestinal: Positive for abdominal pain, diarrhea, nausea and vomiting. Negative for abdominal distention.  Genitourinary: Negative for difficulty urinating.  Musculoskeletal: Negative for back pain, neck pain and neck stiffness.  Skin: Negative for pallor.  Neurological: Negative for dizziness, speech difficulty, weakness and headaches.  Psychiatric/Behavioral: Negative for confusion.    Physical Exam Updated Vital Signs BP 94/62   Pulse 80   Temp 98.7 F (37.1 C) (Oral)   Resp 19   Ht 6' (1.829 m)   Wt 99.8 kg   SpO2 90%   BMI 29.84 kg/m   Physical Exam Constitutional:      General: He is not in acute distress.    Appearance: Normal appearance. He is not ill-appearing, toxic-appearing or diaphoretic.  HENT:     Mouth/Throat:     Mouth: Mucous membranes are moist.     Pharynx: Oropharynx is clear.  Eyes:     General: No scleral icterus.    Extraocular Movements: Extraocular movements intact.     Pupils: Pupils are equal, round, and reactive to light.  Cardiovascular:     Rate and Rhythm: Normal rate and regular rhythm.     Pulses: Normal pulses.     Heart sounds: Normal heart sounds.  Pulmonary:     Effort: Pulmonary effort is normal. No respiratory distress.     Breath sounds: Normal breath sounds. No stridor. No wheezing, rhonchi or rales.  Chest:     Chest wall: No tenderness.  Abdominal:     General: Abdomen is flat. There is no distension.     Palpations: Abdomen is soft.     Tenderness: There is generalized  abdominal tenderness and tenderness in the periumbilical area. There is no guarding or rebound.     Hernia: A hernia is present. Hernia is present in the umbilical area.     Comments: Generalized abdominal tenderness, patient does have umbilical hernia, no erythema or swelling around hernia.  Hernia is reducible, will then come back out.  Is tender to palpation around the area.  Musculoskeletal:        General: No swelling or tenderness. Normal range of motion.     Cervical back: Normal range of motion and neck supple. No rigidity.     Right lower leg: No edema.     Left lower leg: No edema.  Skin:    General: Skin  is warm and dry.     Capillary Refill: Capillary refill takes less than 2 seconds.     Coloration: Skin is not pale.  Neurological:     General: No focal deficit present.     Mental Status: He is alert and oriented to person, place, and time.  Psychiatric:        Mood and Affect: Mood normal.        Behavior: Behavior normal.     ED Results / Procedures / Treatments   Labs (all labs ordered are listed, but only abnormal results are displayed) Labs Reviewed  BASIC METABOLIC PANEL - Abnormal; Notable for the following components:      Result Value   Glucose, Bld 125 (*)    BUN 28 (*)    Creatinine, Ser 1.33 (*)    GFR, Estimated 55 (*)    All other components within normal limits  CBG MONITORING, ED - Abnormal; Notable for the following components:   Glucose-Capillary 110 (*)    All other components within normal limits  RESP PANEL BY RT-PCR (FLU A&B, COVID) ARPGX2  CBC  URINALYSIS, ROUTINE W REFLEX MICROSCOPIC    EKG None  Radiology CT Head Wo Contrast  Result Date: 10/26/2020 CLINICAL DATA:  Head trauma, minor (Age >= 65y) Patient reports syncope today striking head. EXAM: CT HEAD WITHOUT CONTRAST TECHNIQUE: Contiguous axial images were obtained from the base of the skull through the vertex without intravenous contrast. COMPARISON:  None. FINDINGS: Brain:  Brain volume is normal for age. No intracranial hemorrhage, mass effect, or midline shift. No hydrocephalus. The basilar cisterns are patent. No evidence of territorial infarct or acute ischemia. Minimal periventricular and deep white matter hypodensity typical of chronic small vessel ischemia. No extra-axial or intracranial fluid collection. Vascular: No hyperdense vessel. Skull: No fracture or focal lesion. Sinuses/Orbits: Scattered mucosal thickening of ethmoid air cells. No sinus fluid levels. Mastoid air cells are clear. Bilateral cataract resection. Other: None. IMPRESSION: 1. No acute intracranial abnormality. No skull fracture. 2. Minimal chronic small vessel ischemia. Electronically Signed   By: Keith Rake M.D.   On: 10/26/2020 20:26   CT Abdomen Pelvis W Contrast  Result Date: 10/26/2020 CLINICAL DATA:  75 year old male with history of acute onset of nonlocalized abdominal pain today. EXAM: CT ABDOMEN AND PELVIS WITH CONTRAST TECHNIQUE: Multidetector CT imaging of the abdomen and pelvis was performed using the standard protocol following bolus administration of intravenous contrast. CONTRAST:  130mL OMNIPAQUE IOHEXOL 300 MG/ML  SOLN COMPARISON:  CT the abdomen and pelvis 04/22/2008. FINDINGS: Lower chest: Aortic atherosclerosis. Areas of mild fibrosis in the periphery of the lungs bilaterally. Hepatobiliary: Subcentimeter low-attenuation lesion in segment 4A of the liver, too small to characterize, but statistically likely to represent a tiny cyst. No other larger more suspicious appearing hepatic lesions. No intra or extrahepatic biliary ductal dilatation. Gallbladder is normal in appearance. Pancreas: No pancreatic mass. No pancreatic ductal dilatation. No pancreatic or peripancreatic fluid collections or inflammatory changes. Spleen: Unremarkable. Adrenals/Urinary Tract: Bilateral kidneys and adrenal glands are normal in appearance. No hydroureteronephrosis. Urinary bladder is normal in  appearance. Stomach/Bowel: The appearance of the stomach is normal. There are some borderline dilated loops of small bowel in the left side of the abdomen with several air-fluid levels immediately proximal to a small ventral hernia which contains a very short segment of small bowel (axial image 44 of series 2). Beyond this point, distal small bowel is nearly completely decompressed. Colon and rectum are also largely  decompressed. The appendix is not confidently identified and may be surgically absent. Regardless, there are no inflammatory changes noted adjacent to the cecum to suggest the presence of an acute appendicitis at this time. Vascular/Lymphatic: Aortic atherosclerosis, without evidence of aneurysm or dissection in the abdominal or pelvic vasculature. Left external iliac artery appears occluded. Patient is status post fem-fem bypass procedure which is patent. No lymphadenopathy noted in the abdomen or pelvis. Reproductive: Prostate gland and seminal vesicles are unremarkable in appearance. Other: There are 3 adjacent ventral hernias in the epigastric region, 2 of which contain only omental fat, but the third hernia (slightly to the right of midline) contains a short segment of mid small bowel, as discussed above. No significant volume of ascites. No pneumoperitoneum. Musculoskeletal: There are no aggressive appearing lytic or blastic lesions noted in the visualized portions of the skeleton. IMPRESSION: 1. Three small ventral hernias in the epigastric region, one of which (slightly to the right of midline) contains a short segment of mid small bowel. This is associated with several borderline dilated loops of small bowel with air-fluid levels, beyond which the small bowel and colon appear relatively decompressed. Findings may suggest very mild partial small bowel obstruction. Surgical consultation is recommended. 2. Aortic atherosclerosis with complete chronic occlusion of the left external iliac artery in  this patient with patent fem-fem bypass graft. 3. Additional incidental findings, as above. Electronically Signed   By: Vinnie Langton M.D.   On: 10/26/2020 20:48    Procedures Procedures   Medications Ordered in ED Medications  sodium chloride 0.9 % bolus 1,000 mL ( Intravenous Stopped 10/26/20 2119)  ondansetron (ZOFRAN) injection 4 mg (4 mg Intravenous Given 10/26/20 1944)  iohexol (OMNIPAQUE) 300 MG/ML solution 100 mL (100 mLs Intravenous Contrast Given 10/26/20 2014)  fentaNYL (SUBLIMAZE) injection 50 mcg (50 mcg Intravenous Given 10/26/20 2141)  sodium chloride 0.9 % bolus 1,000 mL ( Intravenous Stopped 10/26/20 2244)    ED Course  I have reviewed the triage vital signs and the nursing notes.  Pertinent labs & imaging results that were available during my care of the patient were reviewed by me and considered in my medical decision making (see chart for details).    MDM Rules/Calculators/A&P                         Gerad ABHIJAY MORRISS is a 75 y.o. male with past medical history of acid reflux, hyperlipidemia presents to the emergency department today for nausea, vomiting diarrhea that started around 2 AM this morning.  CT scan does show partial small bowel obstruction, with ventral hernia including small bowel.  No concern for incarcerated or strangulate it hernia, it is reducible.   The patient will need to be admitted at this time for her partial small bowel obstruction and since patient is unable to tolerate p.o. CT head without any acute intra cranial pathology, normal neuro exam.  Surgery aware, they will consult in the morning.  Patient admitted to Dr. Orlin Hilding.   The patient appears reasonably stabilized for admission considering the current resources, flow, and capabilities available in the ED at this time, and I doubt any other [facility] requiring further screening and/or treatment in the ED prior to admission.  I discussed this case with my attending physician who cosigned this note  including patient's presenting symptoms, physical exam, and planned diagnostics and interventions. Attending physician stated agreement with plan or made changes to plan which were implemented.  Final Clinical Impression(s) / ED Diagnoses Final diagnoses:  Partial small bowel obstruction [facility])    Rx / DC Orders ED Discharge Orders    None       Alfredia Client, PA-C 10/26/20 2301    Davonna Belling, MD 10/26/20 [phone]

## 2020-10-27 ENCOUNTER — Encounter (HOSPITAL_COMMUNITY): Payer: Self-pay | Admitting: Family Medicine

## 2020-10-27 ENCOUNTER — Inpatient Hospital Stay (HOSPITAL_COMMUNITY): Payer: Medicare Other

## 2020-10-27 DIAGNOSIS — F32A Depression, unspecified: Secondary | ICD-10-CM | POA: Diagnosis present

## 2020-10-27 DIAGNOSIS — K56600 Partial intestinal obstruction, unspecified as to cause: Secondary | ICD-10-CM | POA: Diagnosis present

## 2020-10-27 DIAGNOSIS — K56609 Unspecified intestinal obstruction, unspecified as to partial versus complete obstruction: Secondary | ICD-10-CM

## 2020-10-27 DIAGNOSIS — Z87891 Personal history of nicotine dependence: Secondary | ICD-10-CM | POA: Diagnosis not present

## 2020-10-27 DIAGNOSIS — Z20822 Contact with and (suspected) exposure to covid-19: Secondary | ICD-10-CM | POA: Diagnosis present

## 2020-10-27 DIAGNOSIS — I739 Peripheral vascular disease, unspecified: Secondary | ICD-10-CM | POA: Diagnosis present

## 2020-10-27 DIAGNOSIS — K6389 Other specified diseases of intestine: Secondary | ICD-10-CM | POA: Diagnosis not present

## 2020-10-27 DIAGNOSIS — K5669 Other partial intestinal obstruction: Secondary | ICD-10-CM | POA: Diagnosis not present

## 2020-10-27 DIAGNOSIS — D631 Anemia in chronic kidney disease: Secondary | ICD-10-CM | POA: Diagnosis present

## 2020-10-27 DIAGNOSIS — E785 Hyperlipidemia, unspecified: Secondary | ICD-10-CM | POA: Diagnosis present

## 2020-10-27 DIAGNOSIS — K219 Gastro-esophageal reflux disease without esophagitis: Secondary | ICD-10-CM | POA: Diagnosis present

## 2020-10-27 DIAGNOSIS — J849 Interstitial pulmonary disease, unspecified: Secondary | ICD-10-CM | POA: Diagnosis present

## 2020-10-27 DIAGNOSIS — E860 Dehydration: Secondary | ICD-10-CM | POA: Diagnosis present

## 2020-10-27 DIAGNOSIS — Z79899 Other long term (current) drug therapy: Secondary | ICD-10-CM | POA: Diagnosis not present

## 2020-10-27 DIAGNOSIS — Z7982 Long term (current) use of aspirin: Secondary | ICD-10-CM | POA: Diagnosis not present

## 2020-10-27 DIAGNOSIS — R55 Syncope and collapse: Secondary | ICD-10-CM | POA: Diagnosis present

## 2020-10-27 DIAGNOSIS — N182 Chronic kidney disease, stage 2 (mild): Secondary | ICD-10-CM | POA: Diagnosis present

## 2020-10-27 DIAGNOSIS — K56699 Other intestinal obstruction unspecified as to partial versus complete obstruction: Secondary | ICD-10-CM | POA: Diagnosis not present

## 2020-10-27 DIAGNOSIS — K439 Ventral hernia without obstruction or gangrene: Secondary | ICD-10-CM | POA: Diagnosis present

## 2020-10-27 DIAGNOSIS — N189 Chronic kidney disease, unspecified: Secondary | ICD-10-CM | POA: Diagnosis not present

## 2020-10-27 DIAGNOSIS — Z4682 Encounter for fitting and adjustment of non-vascular catheter: Secondary | ICD-10-CM | POA: Diagnosis not present

## 2020-10-27 DIAGNOSIS — M8580 Other specified disorders of bone density and structure, unspecified site: Secondary | ICD-10-CM | POA: Diagnosis present

## 2020-10-27 LAB — URINALYSIS, ROUTINE W REFLEX MICROSCOPIC
Bacteria, UA: NONE SEEN
Bilirubin Urine: NEGATIVE
Glucose, UA: NEGATIVE mg/dL
Ketones, ur: NEGATIVE mg/dL
Leukocytes,Ua: NEGATIVE
Nitrite: NEGATIVE
Protein, ur: NEGATIVE mg/dL
Specific Gravity, Urine: 1.031 — ABNORMAL HIGH (ref 1.005–1.030)
pH: 5 (ref 5.0–8.0)

## 2020-10-27 LAB — COMPREHENSIVE METABOLIC PANEL
ALT: 18 U/L (ref 0–44)
AST: 29 U/L (ref 15–41)
Albumin: 3 g/dL — ABNORMAL LOW (ref 3.5–5.0)
Alkaline Phosphatase: 69 U/L (ref 38–126)
Anion gap: 6 (ref 5–15)
BUN: 18 mg/dL (ref 8–23)
CO2: 26 mmol/L (ref 22–32)
Calcium: 8.4 mg/dL — ABNORMAL LOW (ref 8.9–10.3)
Chloride: 105 mmol/L (ref 98–111)
Creatinine, Ser: 1.13 mg/dL (ref 0.61–1.24)
GFR, Estimated: >60 mL/min (ref >60)
Glucose, Bld: 96 mg/dL (ref 70–99)
Potassium: 3.9 mmol/L (ref 3.5–5.1)
Sodium: 137 mmol/L (ref 135–145)
Total Bilirubin: 0.9 mg/dL (ref 0.3–1.2)
Total Protein: 5.7 g/dL — ABNORMAL LOW (ref 6.5–8.1)

## 2020-10-27 LAB — CBC
HCT: 36.1 % — ABNORMAL LOW (ref 39.0–52.0)
Hemoglobin: 12.6 g/dL — ABNORMAL LOW (ref 13.0–17.0)
MCH: 32.6 pg (ref 26.0–34.0)
MCHC: 34.9 g/dL (ref 30.0–36.0)
MCV: 93.3 fL (ref 80.0–100.0)
Platelets: 173 10*3/uL (ref 150–400)
RBC: 3.87 MIL/uL — ABNORMAL LOW (ref 4.22–5.81)
RDW: 13.2 % (ref 11.5–15.5)
WBC: 5.4 10*3/uL (ref 4.0–10.5)
nRBC: 0 % (ref 0.0–0.2)

## 2020-10-27 LAB — MAGNESIUM: Magnesium: 2 mg/dL (ref 1.7–2.4)

## 2020-10-27 LAB — LACTIC ACID, PLASMA: Lactic Acid, Venous: 0.9 mmol/L (ref 0.5–1.9)

## 2020-10-27 MED ORDER — ENOXAPARIN SODIUM 40 MG/0.4ML ~~LOC~~ SOLN
40.0000 mg | SUBCUTANEOUS | Status: DC
  Administered 2020-10-27 – 2020-10-29 (×3): 40 mg via SUBCUTANEOUS
  Filled 2020-10-27 (×3): qty 0.4

## 2020-10-27 MED ORDER — ACETAMINOPHEN 650 MG RE SUPP: 650.0000 mg | Freq: Four times a day (QID) | RECTAL | Status: DC | PRN

## 2020-10-27 MED ORDER — ACETAMINOPHEN 325 MG PO TABS: 650.0000 mg | ORAL_TABLET | Freq: Four times a day (QID) | ORAL | Status: DC | PRN

## 2020-10-27 MED ORDER — DIATRIZOATE MEGLUMINE & SODIUM 66-10 % PO SOLN
90.0000 mL | Freq: Once | ORAL | Status: AC
  Administered 2020-10-27: 90 mL via ORAL
  Filled 2020-10-27: qty 90

## 2020-10-27 MED ORDER — LACTATED RINGERS IV SOLN: INTRAVENOUS | Status: DC

## 2020-10-27 MED ORDER — ONDANSETRON HCL 4 MG/2ML IJ SOLN: 4.0000 mg | Freq: Four times a day (QID) | INTRAMUSCULAR | Status: DC | PRN

## 2020-10-27 MED ORDER — MORPHINE SULFATE (PF) 2 MG/ML IV SOLN: 2.0000 mg | INTRAVENOUS | Status: DC | PRN

## 2020-10-27 NOTE — Progress Notes (Signed)
Patient finished drinking gastrografin at 1421. Contacted radiology to let them know. First image will be taken in one hour.

## 2020-10-27 NOTE — H&P (Signed)
History and Physical    [name] [name] DOB: 10-24-1945 DOA: 10/26/2020  PCP: Deland Pretty, MD  Patient coming from: Home  I have personally briefly reviewed patient's old medical records in [place]  Chief Complaint: N/V/D  HPI: [name] is a 75 y.o. male with medical history significant of GERD, prior hernia repair surgery that unfortunately didn't go to plan, apparently stitched mesh to small bowel so ended up having to go back in and get small bowel resection.  Surgeries were 10 or more years ago, no problems since other than recurrent hernias.  No prior h/o SBO.  Pt presents to ED at [facility] with c/o N/V/D onset around 2am yesterday morning.  Pt was feeling fine on Tues.  Then had onset of N/V that persisted till evening of Wed.  Only a couple episodes of diarrhea.  Had associated generalized abd pain.  No blood in stool, no hematemesis.  No fevers nor chills, no sick contacts.   ED Course: COVID neg.  CT abd/pelvis suggestive of PSBO, possibly related to ventral hernia though this was reducible in the ED.  Pt put on IVF, given pain meds, nausea meds and transferred to [facility] for admission.  At this point, symptoms have significantly improved without the need for NGT.   Review of Systems: As per HPI, otherwise all review of systems negative.  Past Medical History:  Diagnosis Date  . Depression   . GERD (gastroesophageal reflux disease)   . Hyperlipidemia   . Osteopenia     Past Surgical History:  Procedure Laterality Date  . FEMORAL-FEMORAL BYPASS GRAFT Left 01/13/2019   Procedure: BYPASS GRAFT FEMORAL-FEMORAL ARTERY;  Surgeon: Angelia Mould, MD;  Location: [place];  Service: Vascular;  Laterality: Left;  . LUMBAR LAMINECTOMY    . ORCHIECTOMY Left 01/12/2019   Procedure: LEFT INGUINAL RADICAL ORCHIECTOMY;  Surgeon: Lucas Mallow, MD;  Location: [facility];  Service: Urology;  Laterality: Left;  . ROTATOR CUFF  REPAIR Right   . UMBILICAL HERNIA REPAIR       reports that he has quit smoking. His smoking use included cigarettes. He has a 28.00 pack-year smoking history. He has never used smokeless tobacco. He reports current alcohol use of about 6.0 standard drinks of alcohol per week. He reports that he does not use drugs.  No Known Allergies  Family History  Problem Relation Age of Onset  . Other Neg Hx        No FHx of small bowel obstruction     Prior to Admission medications   Medication Sig Start Date End Date Taking? Authorizing Provider  acetaminophen (TYLENOL) 500 MG tablet Take 500 mg by mouth every 6 (six) hours as needed for headache (pain).    [provider]  calcium-vitamin D (OSCAL WITH D) 500-200 MG-UNIT tablet Take 1 tablet by mouth 2 (two) times daily for 10 days. 02/02/19 02/12/19  Mesner, Corene Cornea, MD  cholecalciferol (VITAMIN D) 25 MCG (1000 UT) tablet Take 1,000 Units by mouth at bedtime.     [provider]  loratadine (CLARITIN) 10 MG tablet Take 10 mg by mouth daily. Patient used this medication for his allergies.    [provider]  omeprazole (PRILOSEC OTC) 20 MG tablet Take 20 mg by mouth daily as needed (acid reflux).    [provider]  omeprazole (PRILOSEC) 10 MG capsule TAKE 1 CAPSULE BY MOUTH EVERY DAY 30 MINUTES BEFORE BREAKFAST 09/04/20   [provider]  rosuvastatin (CRESTOR) 10 MG tablet Take 10 mg by mouth daily. 09/27/20   [provider]  sertraline (ZOLOFT) 50 MG tablet Take 50 mg by mouth daily.    [provider]    Physical Exam: Vitals:   10/26/20 2246 10/26/20 2330 10/27/20 0105 10/27/20 0341  BP: 94/62 (!) 92/57 110/69 (!) 96/57  Pulse: 80 75 70 66  Resp:   16   Temp:   98.5 F (36.9 C) 97.6 F (36.4 C)  TempSrc:   Oral   SpO2: 90% 92% 96% 95%  Weight:   99.3 kg   Height:   6' (1.829 m)     Constitutional: NAD, calm, comfortable Eyes: PERRL, lids and conjunctivae normal ENMT:  Mucous membranes are moist. Posterior pharynx clear of any exudate or lesions.Normal dentition.  Neck: normal, supple, no masses, no thyromegaly Respiratory: clear to auscultation bilaterally, no wheezing, no crackles. Normal respiratory effort. No accessory muscle use.  Cardiovascular: Regular rate and rhythm, no murmurs / rubs / gallops. No extremity edema. 2+ pedal pulses. No carotid bruits.  Abdomen: Ventral hernia is soft and reducible. Musculoskeletal: no clubbing / cyanosis. No joint deformity upper and lower extremities. Good ROM, no contractures. Normal muscle tone.  Skin: no rashes, lesions, ulcers. No induration Neurologic: CN 2-12 grossly intact. Sensation intact, DTR normal. Strength 5/5 in all 4.  Psychiatric: Normal judgment and insight. Alert and oriented x 3. Normal mood.    Labs on Admission: I have personally reviewed following labs and imaging studies  CBC: Recent Labs  Lab 10/26/20 1906  WBC 6.7  HGB 15.2  HCT 44.5  MCV 93.3  PLT 242   Basic Metabolic Panel: Recent Labs  Lab 10/26/20 1906  NA 136  K 4.1  CL 99  CO2 26  GLUCOSE 125*  BUN 28*  CREATININE 1.33*  CALCIUM 9.4   GFR: Estimated Creatinine Clearance: 57.7 mL/min (A) (by C-G formula based on SCr of 1.33 mg/dL (H)). Liver Function Tests: No results for input(s): AST, ALT, ALKPHOS, BILITOT, PROT, ALBUMIN in the last 168 hours. No results for input(s): LIPASE, AMYLASE in the last 168 hours. No results for input(s): AMMONIA in the last 168 hours. Coagulation Profile: No results for input(s): INR, PROTIME in the last 168 hours. Cardiac Enzymes: No results for input(s): CKTOTAL, CKMB, CKMBINDEX, TROPONINI in the last 168 hours. BNP (last 3 results) No results for input(s): PROBNP in the last 8760 hours. HbA1C: No results for input(s): HGBA1C in the last 72 hours. CBG: Recent Labs  Lab 10/26/20 1903  GLUCAP 110*   Lipid Profile: No results for input(s): CHOL, HDL, LDLCALC, TRIG, CHOLHDL,  LDLDIRECT in the last 72 hours. Thyroid Function Tests: No results for input(s): TSH, T4TOTAL, FREET4, T3FREE, THYROIDAB in the last 72 hours. Anemia Panel: No results for input(s): VITAMINB12, FOLATE, FERRITIN, TIBC, IRON, RETICCTPCT in the last 72 hours. Urine analysis:    Component Value Date/Time   COLORURINE STRAW (A) 02/02/2019 2000   APPEARANCEUR CLEAR 02/02/2019 2000   LABSPEC 1.006 02/02/2019 2000   PHURINE 6.0 02/02/2019 2000   GLUCOSEU NEGATIVE 02/02/2019 2000   HGBUR NEGATIVE 02/02/2019 2000   BILIRUBINUR NEGATIVE 02/02/2019 2000   KETONESUR NEGATIVE 02/02/2019 2000   PROTEINUR NEGATIVE 02/02/2019 2000   UROBILINOGEN 1.0 06/05/2008 1751   NITRITE NEGATIVE 02/02/2019 2000   LEUKOCYTESUR NEGATIVE 02/02/2019 2000    Radiological Exams on Admission: CT Head Wo Contrast  Result Date: 10/26/2020 CLINICAL DATA:  Head trauma, minor (Age >= 65y)  Patient reports syncope today striking head. EXAM: CT HEAD WITHOUT CONTRAST TECHNIQUE: Contiguous axial images were obtained from the base of the skull through the vertex without intravenous contrast. COMPARISON:  None. FINDINGS: Brain: Brain volume is normal for age. No intracranial hemorrhage, mass effect, or midline shift. No hydrocephalus. The basilar cisterns are patent. No evidence of territorial infarct or acute ischemia. Minimal periventricular and deep white matter hypodensity typical of chronic small vessel ischemia. No extra-axial or intracranial fluid collection. Vascular: No hyperdense vessel. Skull: No fracture or focal lesion. Sinuses/Orbits: Scattered mucosal thickening of ethmoid air cells. No sinus fluid levels. Mastoid air cells are clear. Bilateral cataract resection. Other: None. IMPRESSION: 1. No acute intracranial abnormality. No skull fracture. 2. Minimal chronic small vessel ischemia. Electronically Signed   By: Keith Rake M.D.   On: 10/26/2020 20:26   CT Abdomen Pelvis W Contrast  Result Date:  10/26/2020 CLINICAL DATA:  75 year old male with history of acute onset of nonlocalized abdominal pain today. EXAM: CT ABDOMEN AND PELVIS WITH CONTRAST TECHNIQUE: Multidetector CT imaging of the abdomen and pelvis was performed using the standard protocol following bolus administration of intravenous contrast. CONTRAST:  161mL OMNIPAQUE IOHEXOL 300 MG/ML  SOLN COMPARISON:  CT the abdomen and pelvis 04/22/2008. FINDINGS: Lower chest: Aortic atherosclerosis. Areas of mild fibrosis in the periphery of the lungs bilaterally. Hepatobiliary: Subcentimeter low-attenuation lesion in segment 4A of the liver, too small to characterize, but statistically likely to represent a tiny cyst. No other larger more suspicious appearing hepatic lesions. No intra or extrahepatic biliary ductal dilatation. Gallbladder is normal in appearance. Pancreas: No pancreatic mass. No pancreatic ductal dilatation. No pancreatic or peripancreatic fluid collections or inflammatory changes. Spleen: Unremarkable. Adrenals/Urinary Tract: Bilateral kidneys and adrenal glands are normal in appearance. No hydroureteronephrosis. Urinary bladder is normal in appearance. Stomach/Bowel: The appearance of the stomach is normal. There are some borderline dilated loops of small bowel in the left side of the abdomen with several air-fluid levels immediately proximal to a small ventral hernia which contains a very short segment of small bowel (axial image 44 of series 2). Beyond this point, distal small bowel is nearly completely decompressed. Colon and rectum are also largely decompressed. The appendix is not confidently identified and may be surgically absent. Regardless, there are no inflammatory changes noted adjacent to the cecum to suggest the presence of an acute appendicitis at this time. Vascular/Lymphatic: Aortic atherosclerosis, without evidence of aneurysm or dissection in the abdominal or pelvic vasculature. Left external iliac artery appears  occluded. Patient is status post fem-fem bypass procedure which is patent. No lymphadenopathy noted in the abdomen or pelvis. Reproductive: Prostate gland and seminal vesicles are unremarkable in appearance. Other: There are 3 adjacent ventral hernias in the epigastric region, 2 of which contain only omental fat, but the third hernia (slightly to the right of midline) contains a short segment of mid small bowel, as discussed above. No significant volume of ascites. No pneumoperitoneum. Musculoskeletal: There are no aggressive appearing lytic or blastic lesions noted in the visualized portions of the skeleton. IMPRESSION: 1. Three small ventral hernias in the epigastric region, one of which (slightly to the right of midline) contains a short segment of mid small bowel. This is associated with several borderline dilated loops of small bowel with air-fluid levels, beyond which the small bowel and colon appear relatively decompressed. Findings may suggest very mild partial small bowel obstruction. Surgical consultation is recommended. 2. Aortic atherosclerosis with complete chronic occlusion of the left external  iliac artery in this patient with patent fem-fem bypass graft. 3. Additional incidental findings, as above. Electronically Signed   By: Vinnie Langton M.D.   On: 10/26/2020 20:48    EKG: Independently reviewed.  Assessment/Plan Principal Problem:   SBO (small bowel obstruction) ([place])    1. PSBO - 1. Symptoms improved at this time with NPO status and IVF 2. zofran and morphine PRN if needed 3. Surgery to see in AM 4. No NGT at this time.  DVT prophylaxis: Lovenox Code Status: Full Family Communication: No family in room Disposition Plan: Home after taking POs again Consults called: Gen surg consulted by ED and to see pt in AM Admission status: Admit to inpatient  Severity of Illness: The appropriate patient status for this patient is INPATIENT. Inpatient status is judged to be reasonable  and necessary in order to provide the required intensity of service to ensure the patient's safety. The patient's presenting symptoms, physical exam findings, and initial radiographic and laboratory data in the context of their chronic comorbidities is felt to place them at high risk for further clinical deterioration. Furthermore, it is not anticipated that the patient will be medically stable for discharge from the hospital within 2 midnights of admission. The following factors support the patient status of inpatient.   IP status for SBO.   * I certify that at the point of admission it is my clinical judgment that the patient will require inpatient hospital care spanning beyond 2 midnights from the point of admission due to high intensity of service, high risk for further deterioration and high frequency of surveillance required.*    Shihab States M. DO [facility]  How to contact the [facility] Attending or Consulting provider [place] or covering provider during after hours [place], for this patient?  1. Check the care team in [facility] and look for a) attending/consulting [facility] provider listed and b) the [facility] team listed 2. Log into [email or web address]  Amion Physician Scheduling and messaging for groups and whole hospitals  On call and physician scheduling software for group practices, residents, hospitalists and other medical providers for call, clinic, rotation and shift schedules. On[employer] is a hospital-wide system for scheduling doctors and paging doctors on call. EasyPlot is for scientific plotting and data analysis.  [email or web address]  and use [place]'s universal password to access. If you do not have the password, please contact the hospital operator.  3. Locate the [facility] provider you are looking for under [facility] and page to a number that you can be directly reached. 4. If you still have difficulty reaching the provider, please page the [facility] (Director on Call) for the Hospitalists  listed on amion for assistance.  10/27/2020, 5:37 AM

## 2020-10-27 NOTE — ED Notes (Signed)
Attempted to call report x 1  

## 2020-10-27 NOTE — ED Notes (Signed)
Attempted to call report x2

## 2020-10-27 NOTE — Progress Notes (Signed)
Patient admitted to the hospital earlier this morning by Dr. Alcario Drought  Patient seen and examined. He is currently not having any nausea or vomiting. He said he has not had a bowel movement or passed flatus since yesterday. Abdomen is soft, bowel sounds are hypoactive.  A/P:  1. Partial SBO. -noted on CT imaging -Gen surgery following -currently not having any vomiting -no BM or flatus since yesterday -repeat abd xray ordered for today  2. Dehydration -improved with IV fluids  3. Anemia -suspect this is dilutional -no signs of bleeding -continue to monitor  4. HLD. Resume statin once able to take po meds  [employer]

## 2020-10-27 NOTE — Consult Note (Addendum)
[facility] Surgery Consult Note  KAITLYN SKOWRON 22-Jul-1946  [id number].    Requesting MD: Pearletha Forge Chief Complaint: Nausea, vomiting,diarrhea  Reason for Consult:  REGION OF ACUTE HEMORRHAGE AND NECROSIS HPI:  75 y/o male with hx interstitial lung dz, GERD, Depression, hyperlipidemia, PVD with prior right to left Fem-Fem bypass using Gortex graft for; left external iliac artery occlusion and iliac artery occlusion, 01/13/19.  left radical orchiectomy on 01/12/19 for a testicular mass, showing acute hemorrage and necrosis.   He presented to the ED yesterday after a syncopal episode in the restroom.  He reports waking up around 2 AM yesterday 10/26/2020, with abdominal pain followed by some nausea and vomiting.  He felt like he had to go to the bathroom and when he got up to go to the bathroom he had the syncopal episode along with stool incontinence.  He fell backwards and presented with an occipital hematoma, and the above-noted complaints. His memory was intact before and after the syncopal episode.  He reported some constipation a couple days prior to the abdominal pain nausea, vomiting, diarrhea.  He has been having some trouble with constipation for some time.  He reports no history of fever, chills, or sick contacts.  Currently his abdomen is soft he is not having any nausea or vomiting.  He has no abdominal pain,, he reports passing flatus and no BM since his episode of incontinence yesterday.  Work-up in the ED at [facility] shows he is afebrile blood pressure was 92/68, heart rate was 92, respiratory rate 20, O2 sats 96% on room air.  Admission labs shows BMP is normal except for glucose of 125, BUN of 28, creatinine 1.33, GFR 55, CBC is normal, respiratory panel is negative.  CT of the head showed no acute intracranial abnormalities, no fracture, with minimal chronic small vessel ischemia.  A CT of the abdomen with contrast was then obtained.  This showed some borderline dilated  loops of the small bowel in the left side of the abdomen with several air-fluid levels proximal to a small ventral hernia which contains a short segment of bowel beyond that point distal small bowel nearly completely decompressed.  The appendix was not seen and may be surgically absent, no inflammatory changes noted adjacent to the cecum to suggest appendicitis.  There were 3 ventral hernias in the epigastric region to which contains only omental fat but a third slightly to the right of the midline contains a short segment of small bowel no significant volume of ascites no pneumoperitoneum.  We are asked to see.  NO labs currently pending till AM tomorrow.  ROS: Review of Systems  Constitutional: Negative.   HENT: Negative.   Eyes: Negative.   Respiratory: Negative.   Cardiovascular: Negative.   Gastrointestinal: Positive for abdominal pain, constipation, diarrhea (incontinent with syncopal episode), heartburn, nausea and vomiting.  Genitourinary: Negative.   Musculoskeletal: Negative.   Skin: Negative.   Neurological: Negative.   Endo/Heme/Allergies: Negative.   Psychiatric/Behavioral: Positive for depression.    Family History  Problem Relation Age of Onset  . Other Neg Hx        No FHx of small bowel obstruction    Past Medical History:  Diagnosis Date  . Depression   . GERD (gastroesophageal reflux disease)   . Hyperlipidemia   . Osteopenia     Past Surgical History:  Procedure Laterality Date  . FEMORAL-FEMORAL BYPASS GRAFT Left 01/13/2019   Procedure: BYPASS GRAFT FEMORAL-FEMORAL ARTERY;  Surgeon:  Angelia Mould, MD;  Location: [facility] OR;  Service: Vascular;  Laterality: Left;  . LUMBAR LAMINECTOMY    . ORCHIECTOMY Left 01/12/2019   Procedure: LEFT INGUINAL RADICAL ORCHIECTOMY;  Surgeon: Lucas Mallow, MD;  Location: [facility];  Service: Urology;  Laterality: Left;  . ROTATOR CUFF REPAIR Right   . SMALL INTESTINE SURGERY     hernia repair mesh, got  stitched to his small bowel apparently  . UMBILICAL HERNIA REPAIR      Social History:  reports that he has quit smoking. His smoking use included cigarettes. He has a 28.00 pack-year smoking history. He has never used smokeless tobacco. He reports current alcohol use of about 6.0 standard drinks of alcohol per week. He reports that he does not use drugs.  Allergies: No Known Allergies  Medications Prior to Admission  Medication Sig Dispense Refill  . acetaminophen (TYLENOL) 500 MG tablet Take 500 mg by mouth every 6 (six) hours as needed for headache (pain).    . calcium-vitamin D (OSCAL WITH D) 500-200 MG-UNIT tablet Take 1 tablet by mouth 2 (two) times daily for 10 days. 20 tablet 0  . cholecalciferol (VITAMIN D) 25 MCG (1000 UT) tablet Take 1,000 Units by mouth at bedtime.     Marland Kitchen loratadine (CLARITIN) 10 MG tablet Take 10 mg by mouth daily. Patient used this medication for his allergies.    Marland Kitchen omeprazole (PRILOSEC OTC) 20 MG tablet Take 20 mg by mouth daily as needed (acid reflux).    Marland Kitchen omeprazole (PRILOSEC) 10 MG capsule TAKE 1 CAPSULE BY MOUTH EVERY DAY 30 MINUTES BEFORE BREAKFAST    . rosuvastatin (CRESTOR) 10 MG tablet Take 10 mg by mouth daily.    . sertraline (ZOLOFT) 50 MG tablet Take 50 mg by mouth daily.      Blood pressure (!) 96/57, pulse 66, temperature 97.6 F (36.4 C), resp. rate 16, height 6' (1.829 m), weight 99.3 kg, SpO2 95 %. Physical Exam:  General: pleasant, WD, WN white male who is laying in bed in NAD HEENT: head is normocephalic, atraumatic.  Sclera are noninjected.  Pupils are equal ears and nose without any masses or lesions.  Mouth is pink and moist Heart: regular, rate, and rhythm.  Normal s1,s2. No obvious murmurs, gallops, or rubs noted.  Palpable radial and pedal pulses bilaterally Lungs: CTAB, no wheezes, rhonchi, or rales noted.  Respiratory effort nonlabored Abd: soft, NT, ND, +BS, no masses, he has a soft hernia right periumbilical area that is  nontender.  He also has a left inguinal hernia, and he may have a right inguinal hernia also. MS: all 4 extremities are symmetrical with no cyanosis, clubbing, or edema.  Good distal pulses in both DPs. Skin: warm and dry with no masses, lesions, or rashes Neuro: Cranial nerves 2-12 grossly intact, sensation is normal throughout Psych: A&Ox3 with an appropriate affect.   Results for orders placed or performed during the hospital encounter of 10/26/20 (from the past 48 hour(s))  CBG monitoring, ED     Status: Abnormal   Collection Time: 10/26/20  7:03 PM  Result Value Ref Range   Glucose-Capillary 110 (H) 70 - 99 mg/dL    Comment: Glucose reference range applies only to samples taken after fasting for at least 8 hours.  Basic metabolic panel     Status: Abnormal   Collection Time: 10/26/20  7:06 PM  Result Value Ref Range   Sodium 136 135 - 145 mmol/L  Potassium 4.1 3.5 - 5.1 mmol/L   Chloride 99 98 - 111 mmol/L   CO2 26 22 - 32 mmol/L   Glucose, Bld 125 (H) 70 - 99 mg/dL    Comment: Glucose reference range applies only to samples taken after fasting for at least 8 hours.   BUN 28 (H) 8 - 23 mg/dL   Creatinine, Ser 1.33 (H) 0.61 - 1.24 mg/dL   Calcium 9.4 8.9 - 10.3 mg/dL   GFR, Estimated 55 (L) >60 mL/min    Comment: (NOTE) Calculated using the CKD-EPI Creatinine Equation (2021)    Anion gap 11 5 - 15    Comment: Performed at [facility], [address] [place], [place] [address]  CBC     Status: None   Collection Time: 10/26/20  7:06 PM  Result Value Ref Range   WBC 6.7 4.0 - 10.5 K/uL   RBC 4.77 4.22 - 5.81 MIL/uL   Hemoglobin 15.2 13.0 - 17.0 g/dL   HCT 44.5 39.0 - 52.0 %   MCV 93.3 80.0 - 100.0 fL   MCH 31.9 26.0 - 34.0 pg   MCHC 34.2 30.0 - 36.0 g/dL   RDW 13.1 11.5 - 15.5 %   Platelets 215 150 - 400 K/uL   nRBC 0.0 0.0 - 0.2 %    Comment: Performed at [facility], [address] [place], [place] [address]  Resp Panel by RT-PCR (Flu A&B,  Covid) Nasopharyngeal Swab     Status: None   Collection Time: 10/26/20 11:13 PM   Specimen: Nasopharyngeal Swab; Nasopharyngeal(NP) swabs in vial transport medium  Result Value Ref Range   SARS Coronavirus 2 by RT PCR NEGATIVE NEGATIVE    Comment: (NOTE) SARS-CoV-2 target nucleic acids are NOT DETECTED.  The SARS-CoV-2 RNA is generally detectable in upper respiratory specimens during the acute phase of infection. The lowest concentration of SARS-CoV-2 viral copies this assay can detect is 138 copies/mL. A negative result does not preclude SARS-Cov-2 infection and should not be used as the sole basis for treatment or other patient management decisions. A negative result may occur with  improper specimen collection/handling, submission of specimen other than nasopharyngeal swab, presence of viral mutation(s) within the areas targeted by this assay, and inadequate number of viral copies(<138 copies/mL). A negative result must be combined with clinical observations, patient history, and epidemiological information. The expected result is Negative.  Fact Sheet for Patients:  EntrepreneurPulse.com.au  Fact Sheet for Healthcare Providers:  IncredibleEmployment.be  This test is no t yet approved or cleared by the [place] FDA and  has been authorized for detection and/or diagnosis of SARS-CoV-2 by FDA under an Emergency Use Authorization (EUA). This EUA will remain  in effect (meaning this test can be used) for the duration of the COVID-19 declaration under Section 564(b)(1) of the Act, 21 U.S.C.section 360bbb-3(b)(1), unless the authorization is terminated  or revoked sooner.       Influenza A by PCR NEGATIVE NEGATIVE   Influenza B by PCR NEGATIVE NEGATIVE    Comment: (NOTE) The Xpert Xpress SARS-CoV-2/FLU/RSV plus assay is intended as an aid in the diagnosis of influenza from Nasopharyngeal swab specimens and should not be used as a sole  basis for treatment. Nasal washings and aspirates are unacceptable for Xpert Xpress SARS-CoV-2/FLU/RSV testing.  Fact Sheet for Patients: EntrepreneurPulse.com.au  Fact Sheet for Healthcare Providers: IncredibleEmployment.be  This test is not yet approved or cleared by the [place] FDA and has been  authorized for detection and/or diagnosis of SARS-CoV-2 by FDA under an Emergency Use Authorization (EUA). This EUA will remain in effect (meaning this test can be used) for the duration of the COVID-19 declaration under Section 564(b)(1) of the Act, 21 U.S.C. section 360bbb-3(b)(1), unless the authorization is terminated or revoked.  Performed at [facility], [address], [place], [place] [address]    CT Head Wo Contrast  Result Date: 10/26/2020 CLINICAL DATA:  Head trauma, minor (Age >= 65y) Patient reports syncope today striking head. EXAM: CT HEAD WITHOUT CONTRAST TECHNIQUE: Contiguous axial images were obtained from the base of the skull through the vertex without intravenous contrast. COMPARISON:  None. FINDINGS: Brain: Brain volume is normal for age. No intracranial hemorrhage, mass effect, or midline shift. No hydrocephalus. The basilar cisterns are patent. No evidence of territorial infarct or acute ischemia. Minimal periventricular and deep white matter hypodensity typical of chronic small vessel ischemia. No extra-axial or intracranial fluid collection. Vascular: No hyperdense vessel. Skull: No fracture or focal lesion. Sinuses/Orbits: Scattered mucosal thickening of ethmoid air cells. No sinus fluid levels. Mastoid air cells are clear. Bilateral cataract resection. Other: None. IMPRESSION: 1. No acute intracranial abnormality. No skull fracture. 2. Minimal chronic small vessel ischemia. Electronically Signed   By: Keith Rake M.D.   On: 10/26/2020 20:26   CT Abdomen Pelvis W Contrast  Result Date: 10/26/2020 CLINICAL DATA:   75 year old male with history of acute onset of nonlocalized abdominal pain today. EXAM: CT ABDOMEN AND PELVIS WITH CONTRAST TECHNIQUE: Multidetector CT imaging of the abdomen and pelvis was performed using the standard protocol following bolus administration of intravenous contrast. CONTRAST:  124mL OMNIPAQUE IOHEXOL 300 MG/ML  SOLN COMPARISON:  CT the abdomen and pelvis 04/22/2008. FINDINGS: Lower chest: Aortic atherosclerosis. Areas of mild fibrosis in the periphery of the lungs bilaterally. Hepatobiliary: Subcentimeter low-attenuation lesion in segment 4A of the liver, too small to characterize, but statistically likely to represent a tiny cyst. No other larger more suspicious appearing hepatic lesions. No intra or extrahepatic biliary ductal dilatation. Gallbladder is normal in appearance. Pancreas: No pancreatic mass. No pancreatic ductal dilatation. No pancreatic or peripancreatic fluid collections or inflammatory changes. Spleen: Unremarkable. Adrenals/Urinary Tract: Bilateral kidneys and adrenal glands are normal in appearance. No hydroureteronephrosis. Urinary bladder is normal in appearance. Stomach/Bowel: The appearance of the stomach is normal. There are some borderline dilated loops of small bowel in the left side of the abdomen with several air-fluid levels immediately proximal to a small ventral hernia which contains a very short segment of small bowel (axial image 44 of series 2). Beyond this point, distal small bowel is nearly completely decompressed. Colon and rectum are also largely decompressed. The appendix is not confidently identified and may be surgically absent. Regardless, there are no inflammatory changes noted adjacent to the cecum to suggest the presence of an acute appendicitis at this time. Vascular/Lymphatic: Aortic atherosclerosis, without evidence of aneurysm or dissection in the abdominal or pelvic vasculature. Left external iliac artery appears occluded. Patient is status post  fem-fem bypass procedure which is patent. No lymphadenopathy noted in the abdomen or pelvis. Reproductive: Prostate gland and seminal vesicles are unremarkable in appearance. Other: There are 3 adjacent ventral hernias in the epigastric region, 2 of which contain only omental fat, but the third hernia (slightly to the right of midline) contains a short segment of mid small bowel, as discussed above. No significant volume of ascites. No pneumoperitoneum. Musculoskeletal: There are no aggressive appearing lytic  or blastic lesions noted in the visualized portions of the skeleton. IMPRESSION: 1. Three small ventral hernias in the epigastric region, one of which (slightly to the right of midline) contains a short segment of mid small bowel. This is associated with several borderline dilated loops of small bowel with air-fluid levels, beyond which the small bowel and colon appear relatively decompressed. Findings may suggest very mild partial small bowel obstruction. Surgical consultation is recommended. 2. 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 3. Additional incidental findings, as above. Electronically Signed   By: Vinnie Langton M.D.   On: 10/26/2020 20:48     Assessment/Plan Interstitial lung disease/Hx tobacco use GERD Depression Hyperlipidemia PVD with prior right to left femorofemoral bypass graft 01/13/2019 Left radical orchiectomy 01/12/2019 CKD/dehydration creatinine 1.33  Chronic constipation   Syncope SBO with 3 ventral hernias -appear reduced  FEN: N.p.o./IV fluids ID: None DVT: Lovenox  Plan: Patient actually looks quite good this AM.  His abdomen is soft nontender he is passing flatus.  We will get some plain films and recheck his labs this morning.  We will follow with you.  Film shows distendsion of SB up 3.8 cm.  No real change.  Lactate 0.9, other labs OK.  I will leave on ice chips  and watch.  Mobilize some today, and see how he is in the AM. Will do the SBP and give contrast orally.    Earnstine Regal [facility] Surgery 10/27/2020, 7:46 AM Please see Amion for pager number during day hours 7:00am-4:30pm

## 2020-10-28 ENCOUNTER — Other Ambulatory Visit (HOSPITAL_COMMUNITY): Payer: Medicare Other

## 2020-10-28 ENCOUNTER — Other Ambulatory Visit: Payer: Self-pay | Admitting: *Deleted

## 2020-10-28 ENCOUNTER — Inpatient Hospital Stay (HOSPITAL_COMMUNITY): Payer: Medicare Other

## 2020-10-28 DIAGNOSIS — I745 Embolism and thrombosis of iliac artery: Secondary | ICD-10-CM

## 2020-10-28 DIAGNOSIS — K439 Ventral hernia without obstruction or gangrene: Secondary | ICD-10-CM

## 2020-10-28 DIAGNOSIS — K56600 Partial intestinal obstruction, unspecified as to cause: Principal | ICD-10-CM

## 2020-10-28 LAB — CBC
HCT: 35.9 % — ABNORMAL LOW (ref 39.0–52.0)
Hemoglobin: 12.1 g/dL — ABNORMAL LOW (ref 13.0–17.0)
MCH: 32.3 pg (ref 26.0–34.0)
MCHC: 33.7 g/dL (ref 30.0–36.0)
MCV: 95.7 fL (ref 80.0–100.0)
Platelets: 175 10*3/uL (ref 150–400)
RBC: 3.75 MIL/uL — ABNORMAL LOW (ref 4.22–5.81)
RDW: 13.2 % (ref 11.5–15.5)
WBC: 5.2 10*3/uL (ref 4.0–10.5)
nRBC: 0 % (ref 0.0–0.2)

## 2020-10-28 LAB — COMPREHENSIVE METABOLIC PANEL
ALT: 18 U/L (ref 0–44)
AST: 29 U/L (ref 15–41)
Albumin: 3 g/dL — ABNORMAL LOW (ref 3.5–5.0)
Alkaline Phosphatase: 64 U/L (ref 38–126)
Anion gap: 7 (ref 5–15)
BUN: 17 mg/dL (ref 8–23)
CO2: 25 mmol/L (ref 22–32)
Calcium: 8.6 mg/dL — ABNORMAL LOW (ref 8.9–10.3)
Chloride: 106 mmol/L (ref 98–111)
Creatinine, Ser: 1.2 mg/dL (ref 0.61–1.24)
GFR, Estimated: >60 mL/min (ref >60)
Glucose, Bld: 76 mg/dL (ref 70–99)
Potassium: 3.8 mmol/L (ref 3.5–5.1)
Sodium: 138 mmol/L (ref 135–145)
Total Bilirubin: 1.4 mg/dL — ABNORMAL HIGH (ref 0.3–1.2)
Total Protein: 5.6 g/dL — ABNORMAL LOW (ref 6.5–8.1)

## 2020-10-28 NOTE — Progress Notes (Signed)
Subjective: CC: Patient reports that he is doing well.  He denies any abdominal pain, nausea, vomiting, burping, belching.  He reports he is passing flatus.  He had 2 BMs yesterday that were loose/liquidy.  Objective: Vital signs in last 24 hours: Temp:  [97.6 F (36.4 C)-98.5 F (36.9 C)] 98.5 F (36.9 C) (03/18 0403) Pulse Rate:  [57-68] 66 (03/18 0403) Resp:  [16] 16 (03/18 0403) BP: (96-115)/(54-66) 103/66 (03/18 0403) SpO2:  [80 %-96 %] 96 % (03/18 0403) Last BM Date: 10/27/20  Intake/Output from previous day: 03/17 0701 - 03/18 0700 In: 2291.6 [I.V.:2291.6] Out: -  Intake/Output this shift: No intake/output data recorded.  PE: Gen:  Alert, NAD, pleasant HEENT: EOM's intact, pupils equal and round Card:  Reg Pulm:  Normal rate and effort  Abd: Soft, mild distension, NT, not able to palpate a ventral hernia on the right, +BS Ext:  No LE edema  Psych: A&Ox3  Skin: no rashes noted, warm and dry   Lab Results:  Recent Labs    10/27/20 0856 10/28/20 0042  WBC 5.4 5.2  HGB 12.6* 12.1*  HCT 36.1* 35.9*  PLT 173 175   BMET Recent Labs    10/27/20 0856 10/28/20 0042  NA 137 138  K 3.9 3.8  CL 105 106  CO2 26 25  GLUCOSE 96 76  BUN 18 17  CREATININE 1.13 1.20  CALCIUM 8.4* 8.6*   PT/INR No results for input(s): LABPROT, INR in the last 72 hours. CMP     Component Value Date/Time   NA 138 10/28/2020 0042   K 3.8 10/28/2020 0042   CL 106 10/28/2020 0042   CO2 25 10/28/2020 0042   GLUCOSE 76 10/28/2020 0042   BUN 17 10/28/2020 0042   CREATININE 1.20 10/28/2020 0042   CALCIUM 8.6 (L) 10/28/2020 0042   PROT 5.6 (L) 10/28/2020 0042   ALBUMIN 3.0 (L) 10/28/2020 0042   AST 29 10/28/2020 0042   ALT 18 10/28/2020 0042   ALKPHOS 64 10/28/2020 0042   BILITOT 1.4 (H) 10/28/2020 0042   GFRNONAA >60 10/28/2020 0042   GFRAA >60 02/02/2019 1520   Lipase     Component Value Date/Time   LIPASE 77 (H) 06/05/2008 1825       Studies/Results: CT  Head Wo Contrast  Result Date: 10/26/2020 CLINICAL DATA:  Head trauma, minor (Age >= 65y) Patient reports syncope today striking head. EXAM: CT HEAD WITHOUT CONTRAST TECHNIQUE: Contiguous axial images were obtained from the base of the skull through the vertex without intravenous contrast. COMPARISON:  None. FINDINGS: Brain: Brain volume is normal for age. No intracranial hemorrhage, mass effect, or midline shift. No hydrocephalus. The basilar cisterns are patent. No evidence of territorial infarct or acute ischemia. Minimal periventricular and deep white matter hypodensity typical of chronic small vessel ischemia. No extra-axial or intracranial fluid collection. Vascular: No hyperdense vessel. Skull: No fracture or focal lesion. Sinuses/Orbits: Scattered mucosal thickening of ethmoid air cells. No sinus fluid levels. Mastoid air cells are clear. Bilateral cataract resection. Other: None. IMPRESSION: 1. No acute intracranial abnormality. No skull fracture. 2. Minimal chronic small vessel ischemia. Electronically Signed   By: Keith Rake M.D.   On: 10/26/2020 20:26   CT Abdomen Pelvis W Contrast  Result Date: 10/26/2020 CLINICAL DATA:  75 year old male with history of acute onset of nonlocalized abdominal pain today. EXAM: CT ABDOMEN AND PELVIS WITH CONTRAST TECHNIQUE: Multidetector CT imaging of the abdomen and pelvis was performed using the  standard protocol following bolus administration of intravenous contrast. CONTRAST:  128mL OMNIPAQUE IOHEXOL 300 MG/ML  SOLN COMPARISON:  CT the abdomen and pelvis 04/22/2008. FINDINGS: Lower chest: Aortic atherosclerosis. Areas of mild fibrosis in the periphery of the lungs bilaterally. Hepatobiliary: Subcentimeter low-attenuation lesion in segment 4A of the liver, too small to characterize, but statistically likely to represent a tiny cyst. No other larger more suspicious appearing hepatic lesions. No intra or extrahepatic biliary ductal dilatation. Gallbladder is  normal in appearance. Pancreas: No pancreatic mass. No pancreatic ductal dilatation. No pancreatic or peripancreatic fluid collections or inflammatory changes. Spleen: Unremarkable. Adrenals/Urinary Tract: Bilateral kidneys and adrenal glands are normal in appearance. No hydroureteronephrosis. Urinary bladder is normal in appearance. Stomach/Bowel: The appearance of the stomach is normal. There are some borderline dilated loops of small bowel in the left side of the abdomen with several air-fluid levels immediately proximal to a small ventral hernia which contains a very short segment of small bowel (axial image 44 of series 2). Beyond this point, distal small bowel is nearly completely decompressed. Colon and rectum are also largely decompressed. The appendix is not confidently identified and may be surgically absent. Regardless, there are no inflammatory changes noted adjacent to the cecum to suggest the presence of an acute appendicitis at this time. Vascular/Lymphatic: Aortic atherosclerosis, without evidence of aneurysm or dissection in the abdominal or pelvic vasculature. Left external iliac artery appears occluded. Patient is status post fem-fem bypass procedure which is patent. No lymphadenopathy noted in the abdomen or pelvis. Reproductive: Prostate gland and seminal vesicles are unremarkable in appearance. Other: There are 3 adjacent ventral hernias in the epigastric region, 2 of which contain only omental fat, but the third hernia (slightly to the right of midline) contains a short segment of mid small bowel, as discussed above. No significant volume of ascites. No pneumoperitoneum. Musculoskeletal: There are no aggressive appearing lytic or blastic lesions noted in the visualized portions of the skeleton. IMPRESSION: 1. Three small ventral hernias in the epigastric region, one of which (slightly to the right of midline) contains a short segment of mid small bowel. This is associated with several  borderline dilated loops of small bowel with air-fluid levels, beyond which the small bowel and colon appear relatively decompressed. Findings may suggest very mild partial small bowel obstruction. Surgical consultation is recommended. 2. 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 3. Additional incidental findings, as above. Electronically Signed   By: Vinnie Langton M.D.   On: 10/26/2020 20:48   DG Abd 2 Views  Result Date: 10/27/2020 CLINICAL DATA:  Small bowel obstruction.  Ventral hernia. EXAM: ABDOMEN - 2 VIEW COMPARISON:  Two views of the abdomen 06/07/2008. CT abdomen and pelvis 10/26/2020. FINDINGS: Gaseous distention of small bowel up to 3.8 cm and short air-fluid levels in small bowel are identified. There is some gas and stool in the colon. No free intraperitoneal air. Contrast in urinary bladder from the patient's CT scan incidentally noted. IMPRESSION: No change in the appearance of the patient's small bowel obstruction. Electronically Signed   By: Inge Rise M.D.   On: 10/27/2020 12:23   DG Abd Portable 1V-Small Bowel Obstruction Protocol-initial, 8 hr delay  Result Date: 10/27/2020 CLINICAL DATA:  Small-bowel obstruction EXAM: PORTABLE ABDOMEN - 1 VIEW COMPARISON:  10/27/2020 FINDINGS: Two supine frontal views of the abdomen and pelvis demonstrate progressive the oral contrast throughout the colon. Persistent dilated loops of small  bowel within the left mid abdomen measuring up to 4 cm in size. No masses or abnormal calcifications. The lung bases are clear. IMPRESSION: 1. Progression of oral contrast into the colon, excluding high-grade obstruction. Persistent distended small bowel loops in the left mid abdomen unchanged. Electronically Signed   By: Randa Ngo M.D.   On: 10/27/2020 22:32   DG Abd Portable 1V-Small Bowel Protocol-Position Verification  Result Date: 10/27/2020 CLINICAL  DATA:  Encounter for nasogastric tube placement. Small bowel obstruction. Technologist state 1 hour film. EXAM: PORTABLE ABDOMEN - 1 VIEW COMPARISON:  CT yesterday.  Radiograph earlier today. FINDINGS: No enteric tube is visualized. Administered enteric contrast is seen throughout prominent small bowel in the central abdomen. Small bowel distention up to 4.6 cm. Contrast has likely extended to the colon with hepatic flexure tentatively opacified with contrast. Minimal residual excreted IV contrast in the urinary bladder. IMPRESSION: Administered enteric contrast is seen throughout prominent small bowel in the central abdomen. Small bowel distention up to 4.6 cm. Contrast has likely extended to the colon with hepatic flexure tentatively opacified with contrast, suggesting partial small bowel obstruction. Recommend 8 hour follow-up exam for definitive confirmation of contrast in the colon. Electronically Signed   By: Keith Rake M.D.   On: 10/27/2020 16:48    Anti-infectives: Anti-infectives (From admission, onward)   None       Assessment/Plan Hx Interstitial lung disease/Hx tobacco use Hx GERD Hx Hyperlipidemia Hx PVD with prior right to left femorofemoral bypass graft 01/13/2019 Hx Left radical orchiectomy 01/12/2019 Hx CKD Syncope  - Per [facility] -   SBO with 3 ventral hernias - CT 3/16 w/ Three small ventral hernias in the epigastric region, one of which (slightly to the right of midline) contains a short segment of mid small bowel. This is associated with several borderline dilated loops of small bowel with air-fluid levels, beyond which the small bowel and colon appear relatively decompressed. Findings may suggest very mild partial small bowel obstruction - Patient is afebrile without tachycardia. WBC wnl at 5.2. His abdominal pain has resolved. Denies n/v. Passing flatus and having bowel function. He is NT on exam and I am not able to palpate a hernia to the right of midline. Xray this AM  with contrast in the colon and improved small bowel dilatation. No indication for emergency surgery. Will start on CLD. We will follow closely with you  FEN - CLD, IVF per medicine  VTE - Lovonx ID - None    LOS: 1 day    Jillyn Ledger , [facility] Surgery 10/28/2020, 8:18 AM Please see Amion for pager number during day hours 7:00am-4:30pm

## 2020-10-28 NOTE — Progress Notes (Addendum)
PROGRESS NOTE    [name]  RCV:[id number] DOB: Jul 22, 1946 DOA: 10/26/2020 PCP: Deland Pretty, MD    Brief Narrative:  75 y/o male admitted to the hospital N,V,D. Imaging indicated partial SBO. Gen surgery following. Clinically improving with conservative measures.   Assessment & Plan:   Principal Problem:   SBO (small bowel obstruction) ([place])   1. Partial SBO. -noted on CT imaging -Gen surgery following -appears to be having BMs and flatus -starting on clear liquids today   2. Dehydration -improved with IV fluids -since starting on clear liquids, will decrease IV fluids   3. Anemia -suspect this is dilutional -no signs of bleeding -continue to monitor   4. HLD.  -Resume statin once able to take po meds  5. CKD stage 2 -creatinine currently near baseline -continue to monitor     DVT prophylaxis: enoxaparin (LOVENOX) injection 40 mg Start: 10/27/20 1000   Code Status: full code Family Communication: discussed with patient Disposition Plan: Status is: Inpatient  Remains inpatient appropriate because:IV treatments appropriate due to intensity of illness or inability to take PO   Dispo: The patient is from: Home              Anticipated d/c is to: Home              Patient currently is not medically stable to d/c.   Difficult to place patient No         Consultants:  Gen surgery  Procedures:    Antimicrobials:      Subjective: He had a loose BM this morning, passing flatus, no abd pain, nausea or vomiting  Objective: Vitals:   10/27/20 0341 10/27/20 1451 10/27/20 1929 10/28/20 0403  BP: (!) 96/57 115/64 (!) 96/54 103/66  Pulse: 66 (!) 57 68 66  Resp:  16 16 16   Temp: 97.6 F (36.4 C) 97.6 F (36.4 C) 98.4 F (36.9 C) 98.5 F (36.9 C)  TempSrc:  Oral    SpO2: 95% (!) 80% 93% 96%  Weight:      Height:        Intake/Output Summary (Last 24 hours) at 10/28/2020 1000 Last data filed at 10/28/2020 0800 Gross per 24 hour   Intake 1691.59 ml  Output --  Net 1691.59 ml   Filed Weights   10/26/20 1833 10/26/20 1858 10/27/20 0105  Weight: 100.7 kg 99.8 kg 99.3 kg    Examination:  General exam: Appears calm and comfortable  Respiratory system: Clear to auscultation. Respiratory effort normal. Cardiovascular system: S1 & S2 heard, RRR. No JVD, murmurs, rubs, gallops or clicks. No pedal edema. Gastrointestinal system: Abdomen is nondistended, soft and nontender. No organomegaly or masses felt. Normal bowel sounds heard. Central nervous system: Alert and oriented. No focal neurological deficits. Extremities: Symmetric 5 x 5 power. Skin: No rashes, lesions or ulcers Psychiatry: Judgement and insight appear normal. Mood & affect appropriate.     Data Reviewed: I have personally reviewed following labs and imaging studies  CBC: Recent Labs  Lab 10/26/20 1906 10/27/20 0856 10/28/20 0042  WBC 6.7 5.4 5.2  HGB 15.2 12.6* 12.1*  HCT 44.5 36.1* 35.9*  MCV 93.3 93.3 95.7  PLT 215 173 102   Basic Metabolic Panel: Recent Labs  Lab 10/26/20 1906 10/27/20 0856 10/28/20 0042  NA 136 137 138  K 4.1 3.9 3.8  CL 99 105 106  CO2 26 26 25   GLUCOSE 125* 96 76  BUN 28* 18 17  CREATININE 1.33* 1.13 1.20  CALCIUM 9.4 8.4* 8.6*  MG  --  2.0  --    GFR: Estimated Creatinine Clearance: 63.9 mL/min (by C-G formula based on SCr of 1.2 mg/dL). Liver Function Tests: Recent Labs  Lab 10/27/20 0856 10/28/20 0042  AST 29 29  ALT 18 18  ALKPHOS 69 64  BILITOT 0.9 1.4*  PROT 5.7* 5.6*  ALBUMIN 3.0* 3.0*   No results for input(s): LIPASE, AMYLASE in the last 168 hours. No results for input(s): AMMONIA in the last 168 hours. Coagulation Profile: No results for input(s): INR, PROTIME in the last 168 hours. Cardiac Enzymes: No results for input(s): CKTOTAL, CKMB, CKMBINDEX, TROPONINI in the last 168 hours. BNP (last 3 results) No results for input(s): PROBNP in the last 8760 hours. HbA1C: No results for  input(s): HGBA1C in the last 72 hours. CBG: Recent Labs  Lab 10/26/20 1903  GLUCAP 110*   Lipid Profile: No results for input(s): CHOL, HDL, LDLCALC, TRIG, CHOLHDL, LDLDIRECT in the last 72 hours. Thyroid Function Tests: No results for input(s): TSH, T4TOTAL, FREET4, T3FREE, THYROIDAB in the last 72 hours. Anemia Panel: No results for input(s): VITAMINB12, FOLATE, FERRITIN, TIBC, IRON, RETICCTPCT in the last 72 hours. Sepsis Labs: Recent Labs  Lab 10/27/20 0856  LATICACIDVEN 0.9    Recent Results (from the past 240 hour(s))  Resp Panel by RT-PCR (Flu A&B, Covid) Nasopharyngeal Swab     Status: None   Collection Time: 10/26/20 11:13 PM   Specimen: Nasopharyngeal Swab; Nasopharyngeal(NP) swabs in vial transport medium  Result Value Ref Range Status   SARS Coronavirus 2 by RT PCR NEGATIVE NEGATIVE Final    Comment: (NOTE) SARS-CoV-2 target nucleic acids are NOT DETECTED.  The SARS-CoV-2 RNA is generally detectable in upper respiratory specimens during the acute phase of infection. The lowest concentration of SARS-CoV-2 viral copies this assay can detect is 138 copies/mL. A negative result does not preclude SARS-Cov-2 infection and should not be used as the sole basis for treatment or other patient management decisions. A negative result may occur with  improper specimen collection/handling, submission of specimen other than nasopharyngeal swab, presence of viral mutation(s) within the areas targeted by this assay, and inadequate number of viral copies(<138 copies/mL). A negative result must be combined with clinical observations, patient history, and epidemiological information. The expected result is Negative.  Fact Sheet for Patients:  EntrepreneurPulse.com.au  Fact Sheet for Healthcare Providers:  IncredibleEmployment.be  This test is no t yet approved or cleared by the [place] FDA and  has been authorized for detection and/or  diagnosis of SARS-CoV-2 by FDA under an Emergency Use Authorization (EUA). This EUA will remain  in effect (meaning this test can be used) for the duration of the COVID-19 declaration under Section 564(b)(1) of the Act, 21 U.S.C.section 360bbb-3(b)(1), unless the authorization is terminated  or revoked sooner.       Influenza A by PCR NEGATIVE NEGATIVE Final   Influenza B by PCR NEGATIVE NEGATIVE Final    Comment: (NOTE) The Xpert Xpress SARS-CoV-2/FLU/RSV plus assay is intended as an aid in the diagnosis of influenza from Nasopharyngeal swab specimens and should not be used as a sole basis for treatment. Nasal washings and aspirates are unacceptable for Xpert Xpress SARS-CoV-2/FLU/RSV testing.  Fact Sheet for Patients: EntrepreneurPulse.com.au  Fact Sheet for Healthcare Providers: IncredibleEmployment.be  This test is not yet approved or cleared by the [place] FDA and has been authorized for detection and/or diagnosis of SARS-CoV-2 by FDA under an Emergency Use Authorization (EUA).  This EUA will remain in effect (meaning this test can be used) for the duration of the COVID-19 declaration under Section 564(b)(1) of the Act, 21 U.S.C. section 360bbb-3(b)(1), unless the authorization is terminated or revoked.  Performed at [facility], [address], [place], [place] [address]          Radiology Studies: CT Head Wo Contrast  Result Date: 10/26/2020 CLINICAL DATA:  Head trauma, minor (Age >= 65y) Patient reports syncope today striking head. EXAM: CT HEAD WITHOUT CONTRAST TECHNIQUE: Contiguous axial images were obtained from the base of the skull through the vertex without intravenous contrast. COMPARISON:  None. FINDINGS: Brain: Brain volume is normal for age. No intracranial hemorrhage, mass effect, or midline shift. No hydrocephalus. The basilar cisterns are patent. No evidence of territorial infarct or acute ischemia.  Minimal periventricular and deep white matter hypodensity typical of chronic small vessel ischemia. No extra-axial or intracranial fluid collection. Vascular: No hyperdense vessel. Skull: No fracture or focal lesion. Sinuses/Orbits: Scattered mucosal thickening of ethmoid air cells. No sinus fluid levels. Mastoid air cells are clear. Bilateral cataract resection. Other: None. IMPRESSION: 1. No acute intracranial abnormality. No skull fracture. 2. Minimal chronic small vessel ischemia. Electronically Signed   By: Keith Rake M.D.   On: 10/26/2020 20:26   CT Abdomen Pelvis W Contrast  Result Date: 10/26/2020 CLINICAL DATA:  75 year old male with history of acute onset of nonlocalized abdominal pain today. EXAM: CT ABDOMEN AND PELVIS WITH CONTRAST TECHNIQUE: Multidetector CT imaging of the abdomen and pelvis was performed using the standard protocol following bolus administration of intravenous contrast. CONTRAST:  18mL OMNIPAQUE IOHEXOL 300 MG/ML  SOLN COMPARISON:  CT the abdomen and pelvis 04/22/2008. FINDINGS: Lower chest: Aortic atherosclerosis. Areas of mild fibrosis in the periphery of the lungs bilaterally. Hepatobiliary: Subcentimeter low-attenuation lesion in segment 4A of the liver, too small to characterize, but statistically likely to represent a tiny cyst. No other larger more suspicious appearing hepatic lesions. No intra or extrahepatic biliary ductal dilatation. Gallbladder is normal in appearance. Pancreas: No pancreatic mass. No pancreatic ductal dilatation. No pancreatic or peripancreatic fluid collections or inflammatory changes. Spleen: Unremarkable. Adrenals/Urinary Tract: Bilateral kidneys and adrenal glands are normal in appearance. No hydroureteronephrosis. Urinary bladder is normal in appearance. Stomach/Bowel: The appearance of the stomach is normal. There are some borderline dilated loops of small bowel in the left side of the abdomen with several air-fluid levels immediately  proximal to a small ventral hernia which contains a very short segment of small bowel (axial image 44 of series 2). Beyond this point, distal small bowel is nearly completely decompressed. Colon and rectum are also largely decompressed. The appendix is not confidently identified and may be surgically absent. Regardless, there are no inflammatory changes noted adjacent to the cecum to suggest the presence of an acute appendicitis at this time. Vascular/Lymphatic: Aortic atherosclerosis, without evidence of aneurysm or dissection in the abdominal or pelvic vasculature. Left external iliac artery appears occluded. Patient is status post fem-fem bypass procedure which is patent. No lymphadenopathy noted in the abdomen or pelvis. Reproductive: Prostate gland and seminal vesicles are unremarkable in appearance. Other: There are 3 adjacent ventral hernias in the epigastric region, 2 of which contain only omental fat, but the third hernia (slightly to the right of midline) contains a short segment of mid small bowel, as discussed above. No significant volume of ascites. No pneumoperitoneum. Musculoskeletal: There are no aggressive appearing lytic or blastic lesions noted in the visualized  portions of the skeleton. IMPRESSION: 1. Three small ventral hernias in the epigastric region, one of which (slightly to the right of midline) contains a short segment of mid small bowel. This is associated with several borderline dilated loops of small bowel with air-fluid levels, beyond which the small bowel and colon appear relatively decompressed. Findings may suggest very mild partial small bowel obstruction. Surgical consultation is recommended. 2. 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 3. Additional incidental findings, as above. Electronically Signed   By: Vinnie Langton M.D.   On: 10/26/2020 20:48   DG Abd 2 Views  Result  Date: 10/27/2020 CLINICAL DATA:  Small bowel obstruction.  Ventral hernia. EXAM: ABDOMEN - 2 VIEW COMPARISON:  Two views of the abdomen 06/07/2008. CT abdomen and pelvis 10/26/2020. FINDINGS: Gaseous distention of small bowel up to 3.8 cm and short air-fluid levels in small bowel are identified. There is some gas and stool in the colon. No free intraperitoneal air. Contrast in urinary bladder from the patient's CT scan incidentally noted. IMPRESSION: No change in the appearance of the patient's small bowel obstruction. Electronically Signed   By: Inge Rise M.D.   On: 10/27/2020 12:23   DG Abd Portable 1V  Result Date: 10/28/2020 CLINICAL DATA:  Small bowel obstruction EXAM: PORTABLE ABDOMEN - 1 VIEW COMPARISON:  10/27/2020 FINDINGS: Substantial portions of the right hemiabdomen are excluded from the examination. There is a general paucity of small bowel gas, with gas and contrast present in the colon to the rectum. There is a single loop of small bowel identified in the left hemiabdomen, measuring 2.6 cm in caliber, substantially decreased compared to prior examination. No free air in the abdomen on single supine radiograph. IMPRESSION: There is a general paucity of small bowel gas, with gas and contrast present in the colon to the rectum. There is a single loop of small bowel identified in the left hemiabdomen, measuring 2.6 cm in caliber, substantially decreased compared to prior examination. Findings suggest resolution of bowel obstruction or ileus. Electronically Signed   By: Eddie Candle M.D.   On: 10/28/2020 09:26   DG Abd Portable 1V-Small Bowel Obstruction Protocol-initial, 8 hr delay  Result Date: 10/27/2020 CLINICAL DATA:  Small-bowel obstruction EXAM: PORTABLE ABDOMEN - 1 VIEW COMPARISON:  10/27/2020 FINDINGS: Two supine frontal views of the abdomen and pelvis demonstrate progressive the oral contrast throughout the colon. Persistent dilated loops of small bowel within the left mid abdomen  measuring up to 4 cm in size. No masses or abnormal calcifications. The lung bases are clear. IMPRESSION: 1. Progression of oral contrast into the colon, excluding high-grade obstruction. Persistent distended small bowel loops in the left mid abdomen unchanged. Electronically Signed   By: Randa Ngo M.D.   On: 10/27/2020 22:32   DG Abd Portable 1V-Small Bowel Protocol-Position Verification  Result Date: 10/27/2020 CLINICAL DATA:  Encounter for nasogastric tube placement. Small bowel obstruction. Technologist state 1 hour film. EXAM: PORTABLE ABDOMEN - 1 VIEW COMPARISON:  CT yesterday.  Radiograph earlier today. FINDINGS: No enteric tube is visualized. Administered enteric contrast is seen throughout prominent small bowel in the central abdomen. Small bowel distention up to 4.6 cm. Contrast has likely extended to the colon with hepatic flexure tentatively opacified with contrast. Minimal residual excreted IV contrast in the urinary bladder. IMPRESSION: Administered enteric contrast is seen throughout prominent small bowel in the central abdomen. Small bowel distention up to 4.6  cm. Contrast has likely extended to the colon with hepatic flexure tentatively opacified with contrast, suggesting partial small bowel obstruction. Recommend 8 hour follow-up exam for definitive confirmation of contrast in the colon. Electronically Signed   By: Keith Rake M.D.   On: 10/27/2020 16:48        Scheduled Meds:  enoxaparin (LOVENOX) injection  40 mg Subcutaneous Q24H   Continuous Infusions:  lactated ringers 125 mL/hr at 10/28/20 0401     LOS: 1 day    Time spent: 30 mins    Kathie Dike, MD [facility]   If 7PM-7AM, please contact night-coverage [email or web address]  10/28/2020, 10:00 AM

## 2020-10-28 NOTE — Plan of Care (Signed)
  Problem: Health Behavior/Discharge Planning: Goal: Ability to manage health-related needs will improve Outcome: Progressing   

## 2020-10-29 DIAGNOSIS — N182 Chronic kidney disease, stage 2 (mild): Secondary | ICD-10-CM

## 2020-10-29 DIAGNOSIS — E860 Dehydration: Secondary | ICD-10-CM

## 2020-10-29 NOTE — Discharge Instructions (Signed)
Hernia, Adult   A hernia is the bulging of an organ or tissue through a weak spot in the muscles of the abdomen (abdominal wall). Hernias develop most often near the belly button (navel) or the area where the leg meets the lower abdomen (groin). Common types of hernias include: Incisional hernia. This type bulges through a scar from an abdominal surgery. Umbilical hernia. This type develops near the navel. Inguinal hernia. This type develops in the groin or scrotum. Femoral hernia. This type develops under the groin, in the upper thigh area. Hiatal hernia. This type occurs when part of the stomach slides above the muscle that separates the abdomen from the chest (diaphragm). What are the causes? This condition may be caused by: Heavy lifting. Coughing over a long period of time. Straining to have a bowel movement. Constipation can lead to straining. An incision made during an abdominal surgery. A physical problem that is present at birth (congenital defect). Being overweight or obese. Smoking. Excess fluid in the abdomen. Undescended testicles in males. What are the signs or symptoms? The main symptom is a skin-colored, rounded bulge in the area of the hernia. However, a bulge may not always be present. It may grow bigger or be more visible when you cough or strain (such as when lifting something heavy). A hernia that can be pushed back into the area (is reducible) rarely causes pain. A hernia that cannot be pushed back into the area (is incarcerated) may lose its blood supply (become strangulated). A hernia that is incarcerated may cause: Pain. Fever. Nausea and vomiting. Swelling. Constipation. How is this diagnosed? A hernia may be diagnosed based on: Your symptoms and medical history. A physical exam. Your health care provider may ask you to cough or move in certain ways to see if the hernia becomes visible. Imaging tests, such as: X-rays. Ultrasound. CT scan. How is this  treated? A hernia that is small and painless may not need to be treated. A hernia that is large or painful may be treated with surgery. Inguinal hernias may be treated with surgery to prevent incarceration or strangulation. Strangulated hernias are always treated with surgery because a lack of blood supply to the trapped organ or tissue can cause it to die. Surgery to treat a hernia involves pushing the bulge back into place and repairing the weak area of the muscle or abdominal wall. Follow these instructions at home: Activity Avoid straining. Do not lift anything that is heavier than 10 lb (4.5 kg), or the limit that you are told, until your health care provider says that it is safe. When lifting heavy objects, lift with your leg muscles, not your back muscles. Preventing constipation Take actions to prevent constipation. Constipation leads to straining with bowel movements, which can make a hernia worse or cause a hernia repair to break down. Your health care provider may recommend that you: Drink enough fluid to keep your urine pale yellow. Eat foods that are high in fiber, such as fresh fruits and vegetables, whole grains, and beans. Limit foods that are high in fat and processed sugars, such as fried or sweet foods. Take an over-the-counter or prescription medicine for constipation. General instructions When coughing, try to cough gently. You may try to push the hernia back in place by very gently pressing on it while lying down. Do not try to force the bulge back in if it will not push in easily. If you are overweight, work with your health care provider to lose   weight safely. Do not use any products that contain nicotine or tobacco, such as cigarettes and e-cigarettes. If you need help quitting, ask your health care provider. If you are scheduled for hernia repair, watch your hernia for any changes in shape, size, or color. Tell your health care provider about any changes or new  symptoms. Take over-the-counter and prescription medicines only as told by your health care provider. Keep all follow-up visits as told by your health care provider. This is important. Contact a health care provider if: You develop new pain, swelling, or redness around your hernia. You have signs of constipation, such as: Fewer bowel movements in a week than normal. Difficulty having a bowel movement. Stools that are dry, hard, or larger than normal. Get help right away if: You have a fever. You have abdomen pain that gets worse. You feel nauseous or you vomit. You cannot push the hernia back in place by very gently pressing on it while lying down. Do not try to force the bulge back in if it will not push in easily. The hernia: Changes in shape, size, or color. Feels hard or tender. These symptoms may represent a serious problem that is an emergency. Do not wait to see if the symptoms will go away. Get medical help right away. Call your local emergency services (911 in the [place] Summary A hernia is the bulging of an organ or tissue through a weak spot in the muscles of the abdomen (abdominal wall). The main symptom is a skin-colored, rounded lump (bulge) in the hernia area. However, a bulge may not always be present. It may grow bigger or more visible when you cough or strain (such as when having a bowel movement). A hernia that is small and painless may not need to be treated. A hernia that is large or painful may be treated with surgery. Surgery to treat a hernia involves pushing the bulge back into place and repairing the weak part of the abdomen. This information is not intended to replace advice given to you by your health care provider. Make sure you discuss any questions you have with your health care provider. Document Revised: 11/20/2018 Document Reviewed: 05/01/2017 Elsevier Patient Education  2021 [employer]  

## 2020-10-29 NOTE — Progress Notes (Signed)
AVS given and reviewed with pt. Medications discussed. All questions answered to satisfaction. Pt verbalized understanding of information given. Pt escorted off the unit with all belongings via wheelchair by this RN.  

## 2020-10-29 NOTE — Discharge Summary (Signed)
Physician Discharge Summary  [name]:[id number] DOB: March 22, 1946 DOA: 10/26/2020  PCP: Deland Pretty, MD  Admit date: 10/26/2020 Discharge date: 10/29/2020  Admitted From: home Disposition:  home  Recommendations for Outpatient Follow-up:  1. Follow up with PCP in 1-2 weeks 2. Please obtain BMP/CBC in one week 3. Follow up with general surgery as needed for evaluation of elective ventral hernia repair  Home Health: Equipment/Devices:  Discharge Condition:stable CODE STATUS:full code Diet recommendation: heart healthy  Brief/Interim Summary: 75 y/o male admitted to the hospital N,V,D. Imaging indicated partial SBO. Gen surgery following. Clinically improving with conservative measures.  Discharge Diagnoses:  Principal Problem:   SBO (small bowel obstruction) ([place])  1. Partial SBO. -noted on CT imaging -Gen surgery following -appears to be having BMs and flatus -follow up imaging showed resolution of obstruction -diet advanced to solid food and he tolerated this without abd pain or vomiting -follow up with [facility] to discuss elective repair of ventral hernias  2. Dehydration -improved with IV fluids  3. Anemia -suspect this is dilutional -no signs of bleeding -Hgb remained stable  4. HLD.  -continue statin on discharge  5. CKD stage 2 -creatinine currently near baseline -continue to monitor   Discharge Instructions  Discharge Instructions    Diet - low sodium heart healthy   Complete by: As directed    Increase activity slowly   Complete by: As directed      Allergies as of 10/29/2020   No Known Allergies     Medication List    TAKE these medications   acetaminophen 500 MG tablet Commonly known as: TYLENOL Take 500 mg by mouth every 6 (six) hours as needed for headache (pain).   aspirin EC 81 MG tablet Take 81 mg by mouth daily. Swallow whole.   calcium-vitamin D 500-200 MG-UNIT tablet Commonly known as: OSCAL WITH D Take 1 tablet  by mouth 2 (two) times daily for 10 days.   cholecalciferol 25 MCG (1000 UNIT) tablet Commonly known as: VITAMIN D Take 1,000 Units by mouth at bedtime.   loratadine 10 MG tablet Commonly known as: CLARITIN Take 10 mg by mouth daily. Patient used this medication for his allergies.   omeprazole 20 MG tablet Commonly known as: PRILOSEC OTC Take 20 mg by mouth daily as needed (acid reflux).   rosuvastatin 10 MG tablet Commonly known as: CRESTOR Take 10 mg by mouth daily.   sertraline 50 MG tablet Commonly known as: ZOLOFT Take 50 mg by mouth daily as needed (anxiety).       Follow-up [facility] Follow up.   Specialty: General Surgery Why: You can call to schedule an appointment for follow up if you would like to discuss repair of your hernia's on an elective basis Contact information: [place]       Deland Pretty, MD Follow up.   Specialty: Internal Medicine Contact information: [place] [address] [phone]              No Known Allergies  Consultations:  General surgery   Procedures/Studies: CT Head Wo Contrast  Result Date: 10/26/2020 CLINICAL DATA:  Head trauma, minor (Age >= 65y) Patient reports syncope today striking head. EXAM: CT HEAD WITHOUT CONTRAST TECHNIQUE: Contiguous axial images were obtained from the base of the skull through the vertex without intravenous contrast. COMPARISON:  None. FINDINGS: Brain: Brain volume is normal for age.  No intracranial hemorrhage, mass effect, or midline shift. No hydrocephalus. The basilar cisterns are patent. No evidence of territorial infarct or acute ischemia. Minimal periventricular and deep white matter hypodensity typical of chronic small vessel ischemia. No extra-axial or intracranial fluid collection. Vascular: No hyperdense vessel. Skull: No fracture or focal lesion.  Sinuses/Orbits: Scattered mucosal thickening of ethmoid air cells. No sinus fluid levels. Mastoid air cells are clear. Bilateral cataract resection. Other: None. IMPRESSION: 1. No acute intracranial abnormality. No skull fracture. 2. Minimal chronic small vessel ischemia. Electronically Signed   By: Keith Rake M.D.   On: 10/26/2020 20:26   CT Abdomen Pelvis W Contrast  Result Date: 10/26/2020 CLINICAL DATA:  75 year old male with history of acute onset of nonlocalized abdominal pain today. EXAM: CT ABDOMEN AND PELVIS WITH CONTRAST TECHNIQUE: Multidetector CT imaging of the abdomen and pelvis was performed using the standard protocol following bolus administration of intravenous contrast. CONTRAST:  121mL OMNIPAQUE IOHEXOL 300 MG/ML  SOLN COMPARISON:  CT the abdomen and pelvis 04/22/2008. FINDINGS: Lower chest: Aortic atherosclerosis. Areas of mild fibrosis in the periphery of the lungs bilaterally. Hepatobiliary: Subcentimeter low-attenuation lesion in segment 4A of the liver, too small to characterize, but statistically likely to represent a tiny cyst. No other larger more suspicious appearing hepatic lesions. No intra or extrahepatic biliary ductal dilatation. Gallbladder is normal in appearance. Pancreas: No pancreatic mass. No pancreatic ductal dilatation. No pancreatic or peripancreatic fluid collections or inflammatory changes. Spleen: Unremarkable. Adrenals/Urinary Tract: Bilateral kidneys and adrenal glands are normal in appearance. No hydroureteronephrosis. Urinary bladder is normal in appearance. Stomach/Bowel: The appearance of the stomach is normal. There are some borderline dilated loops of small bowel in the left side of the abdomen with several air-fluid levels immediately proximal to a small ventral hernia which contains a very short segment of small bowel (axial image 44 of series 2). Beyond this point, distal small bowel is nearly completely decompressed. Colon and rectum are also largely  decompressed. The appendix is not confidently identified and may be surgically absent. Regardless, there are no inflammatory changes noted adjacent to the cecum to suggest the presence of an acute appendicitis at this time. Vascular/Lymphatic: Aortic atherosclerosis, without evidence of aneurysm or dissection in the abdominal or pelvic vasculature. Left external iliac artery appears occluded. Patient is status post fem-fem bypass procedure which is patent. No lymphadenopathy noted in the abdomen or pelvis. Reproductive: Prostate gland and seminal vesicles are unremarkable in appearance. Other: There are 3 adjacent ventral hernias in the epigastric region, 2 of which contain only omental fat, but the third hernia (slightly to the right of midline) contains a short segment of mid small bowel, as discussed above. No significant volume of ascites. No pneumoperitoneum. Musculoskeletal: There are no aggressive appearing lytic or blastic lesions noted in the visualized portions of the skeleton. IMPRESSION: 1. Three small ventral hernias in the epigastric region, one of which (slightly to the right of midline) contains a short segment of mid small bowel. This is associated with several borderline dilated loops of small bowel with air-fluid levels, beyond which the small bowel and colon appear relatively decompressed. Findings may suggest very mild partial small bowel obstruction. Surgical consultation is recommended. 2. 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 3. Additional incidental findings, as above. Electronically Signed   By: Vinnie Langton M.D.   On: 10/26/2020 20:48   DG Abd 2 Views  Result Date: 10/27/2020  CLINICAL DATA:  Small bowel obstruction.  Ventral hernia. EXAM: ABDOMEN - 2 VIEW COMPARISON:  Two views of the abdomen 06/07/2008. CT abdomen and pelvis 10/26/2020. FINDINGS: Gaseous distention of small bowel  up to 3.8 cm and short air-fluid levels in small bowel are identified. There is some gas and stool in the colon. No free intraperitoneal air. Contrast in urinary bladder from the patient's CT scan incidentally noted. IMPRESSION: No change in the appearance of the patient's small bowel obstruction. Electronically Signed   By: Inge Rise M.D.   On: 10/27/2020 12:23   DG Abd Portable 1V  Result Date: 10/28/2020 CLINICAL DATA:  Small bowel obstruction EXAM: PORTABLE ABDOMEN - 1 VIEW COMPARISON:  10/27/2020 FINDINGS: Substantial portions of the right hemiabdomen are excluded from the examination. There is a general paucity of small bowel gas, with gas and contrast present in the colon to the rectum. There is a single loop of small bowel identified in the left hemiabdomen, measuring 2.6 cm in caliber, substantially decreased compared to prior examination. No free air in the abdomen on single supine radiograph. IMPRESSION: There is a general paucity of small bowel gas, with gas and contrast present in the colon to the rectum. There is a single loop of small bowel identified in the left hemiabdomen, measuring 2.6 cm in caliber, substantially decreased compared to prior examination. Findings suggest resolution of bowel obstruction or ileus. Electronically Signed   By: Eddie Candle M.D.   On: 10/28/2020 09:26   DG Abd Portable 1V-Small Bowel Obstruction Protocol-initial, 8 hr delay  Result Date: 10/27/2020 CLINICAL DATA:  Small-bowel obstruction EXAM: PORTABLE ABDOMEN - 1 VIEW COMPARISON:  10/27/2020 FINDINGS: Two supine frontal views of the abdomen and pelvis demonstrate progressive the oral contrast throughout the colon. Persistent dilated loops of small bowel within the left mid abdomen measuring up to 4 cm in size. No masses or abnormal calcifications. The lung bases are clear. IMPRESSION: 1. Progression of oral contrast into the colon, excluding high-grade obstruction. Persistent distended small bowel loops  in the left mid abdomen unchanged. Electronically Signed   By: Randa Ngo M.D.   On: 10/27/2020 22:32   DG Abd Portable 1V-Small Bowel Protocol-Position Verification  Result Date: 10/27/2020 CLINICAL DATA:  Encounter for nasogastric tube placement. Small bowel obstruction. Technologist state 1 hour film. EXAM: PORTABLE ABDOMEN - 1 VIEW COMPARISON:  CT yesterday.  Radiograph earlier today. FINDINGS: No enteric tube is visualized. Administered enteric contrast is seen throughout prominent small bowel in the central abdomen. Small bowel distention up to 4.6 cm. Contrast has likely extended to the colon with hepatic flexure tentatively opacified with contrast. Minimal residual excreted IV contrast in the urinary bladder. IMPRESSION: Administered enteric contrast is seen throughout prominent small bowel in the central abdomen. Small bowel distention up to 4.6 cm. Contrast has likely extended to the colon with hepatic flexure tentatively opacified with contrast, suggesting partial small bowel obstruction. Recommend 8 hour follow-up exam for definitive confirmation of contrast in the colon. Electronically Signed   By: Keith Rake M.D.   On: 10/27/2020 16:48       Subjective: Patient is having bowel movements/flatus, no vomiting, tolerating solid diet  Discharge Exam: Vitals:   10/28/20 1417 10/28/20 2141 10/29/20 0333 10/29/20 1404  BP: 129/73 119/71 127/64 122/74  Pulse: 66 (!) 57 68 64  Resp: 17 17 16 17   Temp: 98.1 F (36.7 C) 98.9 F (37.2 C) 98.4 F (36.9 C) 98.4 F (36.9 C)  TempSrc:  Oral  Oral   SpO2: 97% 95% 93% 95%  Weight:      Height:        General: Pt is alert, awake, not in acute distress Cardiovascular: RRR, S1/S2 +, no rubs, no gallops Respiratory: CTA bilaterally, no wheezing, no rhonchi Abdominal: Soft, NT, ND, bowel sounds + Extremities: no edema, no cyanosis    The results of significant diagnostics from this hospitalization (including imaging,  microbiology, ancillary and laboratory) are listed below for reference.     Microbiology: Recent Results (from the past 240 hour(s))  Resp Panel by RT-PCR (Flu A&B, Covid) Nasopharyngeal Swab     Status: None   Collection Time: 10/26/20 11:13 PM   Specimen: Nasopharyngeal Swab; Nasopharyngeal(NP) swabs in vial transport medium  Result Value Ref Range Status   SARS Coronavirus 2 by RT PCR NEGATIVE NEGATIVE Final    Comment: (NOTE) SARS-CoV-2 target nucleic acids are NOT DETECTED.  The SARS-CoV-2 RNA is generally detectable in upper respiratory specimens during the acute phase of infection. The lowest concentration of SARS-CoV-2 viral copies this assay can detect is 138 copies/mL. A negative result does not preclude SARS-Cov-2 infection and should not be used as the sole basis for treatment or other patient management decisions. A negative result may occur with  improper specimen collection/handling, submission of specimen other than nasopharyngeal swab, presence of viral mutation(s) within the areas targeted by this assay, and inadequate number of viral copies(<138 copies/mL). A negative result must be combined with clinical observations, patient history, and epidemiological information. The expected result is Negative.  Fact Sheet for Patients:  EntrepreneurPulse.com.au  Fact Sheet for Healthcare Providers:  IncredibleEmployment.be  This test is no t yet approved or cleared by the [place] FDA and  has been authorized for detection and/or diagnosis of SARS-CoV-2 by FDA under an Emergency Use Authorization (EUA). This EUA will remain  in effect (meaning this test can be used) for the duration of the COVID-19 declaration under Section 564(b)(1) of the Act, 21 U.S.C.section 360bbb-3(b)(1), unless the authorization is terminated  or revoked sooner.       Influenza A by PCR NEGATIVE NEGATIVE Final   Influenza B by PCR NEGATIVE NEGATIVE  Final    Comment: (NOTE) The Xpert Xpress SARS-CoV-2/FLU/RSV plus assay is intended as an aid in the diagnosis of influenza from Nasopharyngeal swab specimens and should not be used as a sole basis for treatment. Nasal washings and aspirates are unacceptable for Xpert Xpress SARS-CoV-2/FLU/RSV testing.  Fact Sheet for Patients: EntrepreneurPulse.com.au  Fact Sheet for Healthcare Providers: IncredibleEmployment.be  This test is not yet approved or cleared by the [place] FDA and has been authorized for detection and/or diagnosis of SARS-CoV-2 by FDA under an Emergency Use Authorization (EUA). This EUA will remain in effect (meaning this test can be used) for the duration of the COVID-19 declaration under Section 564(b)(1) of the Act, 21 U.S.C. section 360bbb-3(b)(1), unless the authorization is terminated or revoked.  Performed at [facility], [address] [place], [place] [address]      Labs: BNP (last 3 results) No results for input(s): BNP in the last 8760 hours. Basic Metabolic Panel: Recent Labs  Lab 10/26/20 1906 10/27/20 0856 10/28/20 0042  NA 136 137 138  K 4.1 3.9 3.8  CL 99 105 106  CO2 26 26 25   GLUCOSE 125* 96 76  BUN 28* 18 17  CREATININE 1.33* 1.13 1.20  CALCIUM 9.4 8.4* 8.6*  MG  --  2.0  --  Liver Function Tests: Recent Labs  Lab 10/27/20 0856 10/28/20 0042  AST 29 29  ALT 18 18  ALKPHOS 69 64  BILITOT 0.9 1.4*  PROT 5.7* 5.6*  ALBUMIN 3.0* 3.0*   No results for input(s): LIPASE, AMYLASE in the last 168 hours. No results for input(s): AMMONIA in the last 168 hours. CBC: Recent Labs  Lab 10/26/20 1906 10/27/20 0856 10/28/20 0042  WBC 6.7 5.4 5.2  HGB 15.2 12.6* 12.1*  HCT 44.5 36.1* 35.9*  MCV 93.3 93.3 95.7  PLT 215 173 175   Cardiac Enzymes: No results for input(s): CKTOTAL, CKMB, CKMBINDEX, TROPONINI in the last 168 hours. BNP: Invalid input(s): POCBNP CBG: Recent  Labs  Lab 10/26/20 1903  GLUCAP 110*   D-Dimer No results for input(s): DDIMER in the last 72 hours. Hgb A1c No results for input(s): HGBA1C in the last 72 hours. Lipid Profile No results for input(s): CHOL, HDL, LDLCALC, TRIG, CHOLHDL, LDLDIRECT in the last 72 hours. Thyroid function studies No results for input(s): TSH, T4TOTAL, T3FREE, THYROIDAB in the last 72 hours.  Invalid input(s): FREET3 Anemia work up No results for input(s): VITAMINB12, FOLATE, FERRITIN, TIBC, IRON, RETICCTPCT in the last 72 hours. Urinalysis    Component Value Date/Time   COLORURINE YELLOW 10/27/2020 0056   APPEARANCEUR CLEAR 10/27/2020 0056   LABSPEC 1.031 (H) 10/27/2020 0056   PHURINE 5.0 10/27/2020 0056   GLUCOSEU NEGATIVE 10/27/2020 0056   HGBUR SMALL (A) 10/27/2020 0056   BILIRUBINUR NEGATIVE 10/27/2020 0056   KETONESUR NEGATIVE 10/27/2020 0056   PROTEINUR NEGATIVE 10/27/2020 0056   UROBILINOGEN 1.0 06/05/2008 1751   NITRITE NEGATIVE 10/27/2020 0056   LEUKOCYTESUR NEGATIVE 10/27/2020 0056   Sepsis Labs Invalid input(s): PROCALCITONIN,  WBC,  LACTICIDVEN Microbiology Recent Results (from the past 240 hour(s))  Resp Panel by RT-PCR (Flu A&B, Covid) Nasopharyngeal Swab     Status: None   Collection Time: 10/26/20 11:13 PM   Specimen: Nasopharyngeal Swab; Nasopharyngeal(NP) swabs in vial transport medium  Result Value Ref Range Status   SARS Coronavirus 2 by RT PCR NEGATIVE NEGATIVE Final    Comment: (NOTE) SARS-CoV-2 target nucleic acids are NOT DETECTED.  The SARS-CoV-2 RNA is generally detectable in upper respiratory specimens during the acute phase of infection. The lowest concentration of SARS-CoV-2 viral copies this assay can detect is 138 copies/mL. A negative result does not preclude SARS-Cov-2 infection and should not be used as the sole basis for treatment or other patient management decisions. A negative result may occur with  improper specimen collection/handling, submission  of specimen other than nasopharyngeal swab, presence of viral mutation(s) within the areas targeted by this assay, and inadequate number of viral copies(<138 copies/mL). A negative result must be combined with clinical observations, patient history, and epidemiological information. The expected result is Negative.  Fact Sheet for Patients:  EntrepreneurPulse.com.au  Fact Sheet for Healthcare Providers:  IncredibleEmployment.be  This test is no t yet approved or cleared by the [place] FDA and  has been authorized for detection and/or diagnosis of SARS-CoV-2 by FDA under an Emergency Use Authorization (EUA). This EUA will remain  in effect (meaning this test can be used) for the duration of the COVID-19 declaration under Section 564(b)(1) of the Act, 21 U.S.C.section 360bbb-3(b)(1), unless the authorization is terminated  or revoked sooner.       Influenza A by PCR NEGATIVE NEGATIVE Final   Influenza B by PCR NEGATIVE NEGATIVE Final    Comment: (NOTE) The Xpert Xpress SARS-CoV-2/FLU/RSV plus assay is  intended as an aid in the diagnosis of influenza from Nasopharyngeal swab specimens and should not be used as a sole basis for treatment. Nasal washings and aspirates are unacceptable for Xpert Xpress SARS-CoV-2/FLU/RSV testing.  Fact Sheet for Patients: EntrepreneurPulse.com.au  Fact Sheet for Healthcare Providers: IncredibleEmployment.be  This test is not yet approved or cleared by the [place] FDA and has been authorized for detection and/or diagnosis of SARS-CoV-2 by FDA under an Emergency Use Authorization (EUA). This EUA will remain in effect (meaning this test can be used) for the duration of the COVID-19 declaration under Section 564(b)(1) of the Act, 21 U.S.C. section 360bbb-3(b)(1), unless the authorization is terminated or revoked.  Performed at [facility], [address], [place], [place] [address]      Time coordinating discharge: 54mins  SIGNED:   Kathie Dike, MD  [facility] 10/29/2020, 5:09 PM   If 7PM-7AM, please contact night-coverage [email or web address]

## 2020-10-29 NOTE — Progress Notes (Signed)
Subjective: CC: Patient reports that he is doing well.  No abdominal pain, nausea, vomiting, burping, or belching.  He is passing flatus.  Notes 2 liquid BMs yesterday and one this am.  Tolerating clear liquids.  Mobilizing.  Objective: Vital signs in last 24 hours: Temp:  [98.1 F (36.7 C)-98.9 F (37.2 C)] 98.4 F (36.9 C) (03/19 0333) Pulse Rate:  [57-68] 68 (03/19 0333) Resp:  [16-17] 16 (03/19 0333) BP: (119-129)/(64-73) 127/64 (03/19 0333) SpO2:  [93 %-97 %] 93 % (03/19 0333) Last BM Date: 10/28/20  Intake/Output from previous day: 03/18 0701 - 03/19 0700 In: 850 [P.O.:850] Out: 550 [Urine:550] Intake/Output this shift: No intake/output data recorded.  PE: Gen:  Alert, NAD, pleasant HEENT: EOM's intact, pupils equal and round Card:  Reg Pulm:  Normal rate and effort  Abd: Soft, mild distension, NT, not able to palpate a ventral hernia on the right, +BS Ext:  No LE edema  Psych: A&Ox3  Skin: no rashes noted, warm and dry  Lab Results:  Recent Labs    10/27/20 0856 10/28/20 0042  WBC 5.4 5.2  HGB 12.6* 12.1*  HCT 36.1* 35.9*  PLT 173 175   BMET Recent Labs    10/27/20 0856 10/28/20 0042  NA 137 138  K 3.9 3.8  CL 105 106  CO2 26 25  GLUCOSE 96 76  BUN 18 17  CREATININE 1.13 1.20  CALCIUM 8.4* 8.6*   PT/INR No results for input(s): LABPROT, INR in the last 72 hours. CMP     Component Value Date/Time   NA 138 10/28/2020 0042   K 3.8 10/28/2020 0042   CL 106 10/28/2020 0042   CO2 25 10/28/2020 0042   GLUCOSE 76 10/28/2020 0042   BUN 17 10/28/2020 0042   CREATININE 1.20 10/28/2020 0042   CALCIUM 8.6 (L) 10/28/2020 0042   PROT 5.6 (L) 10/28/2020 0042   ALBUMIN 3.0 (L) 10/28/2020 0042   AST 29 10/28/2020 0042   ALT 18 10/28/2020 0042   ALKPHOS 64 10/28/2020 0042   BILITOT 1.4 (H) 10/28/2020 0042   GFRNONAA >60 10/28/2020 0042   GFRAA >60 02/02/2019 1520   Lipase     Component Value Date/Time   LIPASE 77 (H) 06/05/2008 1825        Studies/Results: DG Abd 2 Views  Result Date: 10/27/2020 CLINICAL DATA:  Small bowel obstruction.  Ventral hernia. EXAM: ABDOMEN - 2 VIEW COMPARISON:  Two views of the abdomen 06/07/2008. CT abdomen and pelvis 10/26/2020. FINDINGS: Gaseous distention of small bowel up to 3.8 cm and short air-fluid levels in small bowel are identified. There is some gas and stool in the colon. No free intraperitoneal air. Contrast in urinary bladder from the patient's CT scan incidentally noted. IMPRESSION: No change in the appearance of the patient's small bowel obstruction. Electronically Signed   By: Inge Rise M.D.   On: 10/27/2020 12:23   DG Abd Portable 1V  Result Date: 10/28/2020 CLINICAL DATA:  Small bowel obstruction EXAM: PORTABLE ABDOMEN - 1 VIEW COMPARISON:  10/27/2020 FINDINGS: Substantial portions of the right hemiabdomen are excluded from the examination. There is a general paucity of small bowel gas, with gas and contrast present in the colon to the rectum. There is a single loop of small bowel identified in the left hemiabdomen, measuring 2.6 cm in caliber, substantially decreased compared to prior examination. No free air in the abdomen on single supine radiograph. IMPRESSION: There is a general paucity of small  bowel gas, with gas and contrast present in the colon to the rectum. There is a single loop of small bowel identified in the left hemiabdomen, measuring 2.6 cm in caliber, substantially decreased compared to prior examination. Findings suggest resolution of bowel obstruction or ileus. Electronically Signed   By: Eddie Candle M.D.   On: 10/28/2020 09:26   DG Abd Portable 1V-Small Bowel Obstruction Protocol-initial, 8 hr delay  Result Date: 10/27/2020 CLINICAL DATA:  Small-bowel obstruction EXAM: PORTABLE ABDOMEN - 1 VIEW COMPARISON:  10/27/2020 FINDINGS: Two supine frontal views of the abdomen and pelvis demonstrate progressive the oral contrast throughout the colon. Persistent  dilated loops of small bowel within the left mid abdomen measuring up to 4 cm in size. No masses or abnormal calcifications. The lung bases are clear. IMPRESSION: 1. Progression of oral contrast into the colon, excluding high-grade obstruction. Persistent distended small bowel loops in the left mid abdomen unchanged. Electronically Signed   By: Randa Ngo M.D.   On: 10/27/2020 22:32   DG Abd Portable 1V-Small Bowel Protocol-Position Verification  Result Date: 10/27/2020 CLINICAL DATA:  Encounter for nasogastric tube placement. Small bowel obstruction. Technologist state 1 hour film. EXAM: PORTABLE ABDOMEN - 1 VIEW COMPARISON:  CT yesterday.  Radiograph earlier today. FINDINGS: No enteric tube is visualized. Administered enteric contrast is seen throughout prominent small bowel in the central abdomen. Small bowel distention up to 4.6 cm. Contrast has likely extended to the colon with hepatic flexure tentatively opacified with contrast. Minimal residual excreted IV contrast in the urinary bladder. IMPRESSION: Administered enteric contrast is seen throughout prominent small bowel in the central abdomen. Small bowel distention up to 4.6 cm. Contrast has likely extended to the colon with hepatic flexure tentatively opacified with contrast, suggesting partial small bowel obstruction. Recommend 8 hour follow-up exam for definitive confirmation of contrast in the colon. Electronically Signed   By: Keith Rake M.D.   On: 10/27/2020 16:48    Anti-infectives: Anti-infectives (From admission, onward)   None       Assessment/Plan Hx Interstitial lung disease/Hx tobacco use Hx GERD Hx Hyperlipidemia Hx PVD with prior right to left femorofemoral bypass graft 01/13/2019 Hx Left radical orchiectomy 01/12/2019 Hx CKD Syncope  - Per [facility] -   SBO with 3 ventral hernias - CT 3/16 w/ Three small ventral hernias in the epigastric region, one of which (slightly to the right of midline) contains a short segment  of mid small bowel. This is associated with several borderline dilated loops of small bowel with air-fluid levels, beyond which the small bowel and colon appear relatively decompressed. Findings may suggest very mild partial small bowel obstruction - Patients abdominal pain has resolved. Denies n/v and is tolerating cld. He is passing flatus and having bowel function. He is NT on exam and I am not able to palpate a hernia to the right of midline. Xray yesterday AM with contrast in the colon and improved small bowel dilatation. No indication for emergency surgery. Will adv to soft diet. If tolerates soft diet, okay for d/c from our standpoint. I discussed he can follow up as an outpatient if he wishes to discuss hernia surgery on an elective basis.  FEN - Soft, IVF per medicine  VTE - SCDs, Loveonx ID - None   LOS: 2 days    Jillyn Ledger , [facility] 10/29/2020, 8:01 AM Please see Amion for pager number during day hours 7:00am-4:30pm

## 2020-10-29 NOTE — Plan of Care (Signed)
  Problem: Education: Goal: Knowledge of General Education information will improve Description: Including pain rating scale, medication(s)/side effects and non-pharmacologic comfort measures Outcome: Progressing   Problem: Health Behavior/Discharge Planning: Goal: Ability to manage health-related needs will improve Outcome: Progressing   Problem: Clinical Measurements: Goal: Ability to maintain clinical measurements within normal limits will improve Outcome: Progressing   Problem: Activity: Goal: Risk for activity intolerance will decrease Outcome: Progressing   Problem: Elimination: Goal: Will not experience complications related to bowel motility Outcome: Progressing Goal: Will not experience complications related to urinary retention Outcome: Progressing   Problem: Pain Managment: Goal: General experience of comfort will improve Outcome: Progressing   Problem: Safety: Goal: Ability to remain free from injury will improve Outcome: Progressing   Problem: Skin Integrity: Goal: Risk for impaired skin integrity will decrease Outcome: Progressing   

## 2020-10-31 ENCOUNTER — Ambulatory Visit: Payer: Medicare Other | Admitting: Pulmonary Disease

## 2020-11-01 ENCOUNTER — Ambulatory Visit (HOSPITAL_COMMUNITY)
Admission: RE | Admit: 2020-11-01 | Discharge: 2020-11-01 | Disposition: A | Discharge status: Home / Self Care | Payer: Medicare Other | Source: Ambulatory Visit | Attending: Vascular Surgery | Admitting: Vascular Surgery

## 2020-11-01 ENCOUNTER — Ambulatory Visit (INDEPENDENT_AMBULATORY_CARE_PROVIDER_SITE_OTHER)
Admission: RE | Admit: 2020-11-01 | Discharge: 2020-11-01 | Disposition: A | Discharge status: Home / Self Care | Payer: Medicare Other | Source: Ambulatory Visit | Attending: Vascular Surgery | Admitting: Vascular Surgery

## 2020-11-01 ENCOUNTER — Ambulatory Visit (INDEPENDENT_AMBULATORY_CARE_PROVIDER_SITE_OTHER): Payer: Medicare Other | Admitting: Physician Assistant

## 2020-11-01 ENCOUNTER — Other Ambulatory Visit: Payer: Self-pay

## 2020-11-01 VITALS — BP 116/78 | HR 63 | Temp 98.1°F | Resp 20 | Ht 72.0 in | Wt 220.7 lb

## 2020-11-01 DIAGNOSIS — I739 Peripheral vascular disease, unspecified: Secondary | ICD-10-CM | POA: Insufficient documentation

## 2020-11-01 DIAGNOSIS — K219 Gastro-esophageal reflux disease without esophagitis: Secondary | ICD-10-CM | POA: Insufficient documentation

## 2020-11-01 DIAGNOSIS — N529 Male erectile dysfunction, unspecified: Secondary | ICD-10-CM

## 2020-11-01 DIAGNOSIS — Z87891 Personal history of nicotine dependence: Secondary | ICD-10-CM | POA: Insufficient documentation

## 2020-11-01 DIAGNOSIS — I745 Embolism and thrombosis of iliac artery: Secondary | ICD-10-CM

## 2020-11-01 DIAGNOSIS — I700 Atherosclerosis of aorta: Secondary | ICD-10-CM

## 2020-11-01 DIAGNOSIS — Z87448 Personal history of other diseases of urinary system: Secondary | ICD-10-CM | POA: Insufficient documentation

## 2020-11-01 DIAGNOSIS — Z86010 Personal history of colonic polyps: Secondary | ICD-10-CM

## 2020-11-01 DIAGNOSIS — Z800 Family history of malignant neoplasm of digestive organs: Secondary | ICD-10-CM

## 2020-11-01 DIAGNOSIS — M8580 Other specified disorders of bone density and structure, unspecified site: Secondary | ICD-10-CM | POA: Insufficient documentation

## 2020-11-01 DIAGNOSIS — Z8781 Personal history of (healed) traumatic fracture: Secondary | ICD-10-CM | POA: Insufficient documentation

## 2020-11-01 DIAGNOSIS — Z6830 Body mass index (BMI) 30.0-30.9, adult: Secondary | ICD-10-CM

## 2020-11-01 DIAGNOSIS — Z7982 Long term (current) use of aspirin: Secondary | ICD-10-CM

## 2020-11-01 DIAGNOSIS — Z85828 Personal history of other malignant neoplasm of skin: Secondary | ICD-10-CM | POA: Insufficient documentation

## 2020-11-01 DIAGNOSIS — H9190 Unspecified hearing loss, unspecified ear: Secondary | ICD-10-CM

## 2020-11-01 DIAGNOSIS — M179 Osteoarthritis of knee, unspecified: Secondary | ICD-10-CM

## 2020-11-01 DIAGNOSIS — F411 Generalized anxiety disorder: Secondary | ICD-10-CM | POA: Insufficient documentation

## 2020-11-01 DIAGNOSIS — L578 Other skin changes due to chronic exposure to nonionizing radiation: Secondary | ICD-10-CM

## 2020-11-01 DIAGNOSIS — K439 Ventral hernia without obstruction or gangrene: Secondary | ICD-10-CM | POA: Insufficient documentation

## 2020-11-01 DIAGNOSIS — R252 Cramp and spasm: Secondary | ICD-10-CM | POA: Insufficient documentation

## 2020-11-01 DIAGNOSIS — N503 Cyst of epididymis: Secondary | ICD-10-CM

## 2020-11-01 DIAGNOSIS — J45901 Unspecified asthma with (acute) exacerbation: Secondary | ICD-10-CM | POA: Insufficient documentation

## 2020-11-01 DIAGNOSIS — Z8249 Family history of ischemic heart disease and other diseases of the circulatory system: Secondary | ICD-10-CM

## 2020-11-01 DIAGNOSIS — J8409 Other alveolar and parieto-alveolar conditions: Secondary | ICD-10-CM | POA: Insufficient documentation

## 2020-11-01 DIAGNOSIS — M1710 Unilateral primary osteoarthritis, unspecified knee: Secondary | ICD-10-CM | POA: Insufficient documentation

## 2020-11-01 DIAGNOSIS — E785 Hyperlipidemia, unspecified: Secondary | ICD-10-CM | POA: Insufficient documentation

## 2020-11-01 DIAGNOSIS — Z860101 Personal history of adenomatous and serrated colon polyps: Secondary | ICD-10-CM

## 2020-11-01 HISTORY — DX: Personal history of other malignant neoplasm of skin: Z85.828

## 2020-11-01 HISTORY — DX: Family history of ischemic heart disease and other diseases of the circulatory system: Z82.49

## 2020-11-01 HISTORY — DX: Male erectile dysfunction, unspecified: N52.9

## 2020-11-01 HISTORY — DX: Family history of malignant neoplasm of digestive organs: Z80.0

## 2020-11-01 HISTORY — DX: Cyst of epididymis: N50.3

## 2020-11-01 HISTORY — DX: Other skin changes due to chronic exposure to nonionizing radiation: L57.8

## 2020-11-01 HISTORY — DX: Cramp and spasm: R25.2

## 2020-11-01 HISTORY — DX: Unspecified asthma with (acute) exacerbation: J45.901

## 2020-11-01 HISTORY — DX: Body mass index (BMI) 30.0-30.9, adult: Z68.30

## 2020-11-01 HISTORY — DX: Peripheral vascular disease, unspecified: I73.9

## 2020-11-01 HISTORY — DX: Unspecified hearing loss, unspecified ear: H91.90

## 2020-11-01 HISTORY — DX: Personal history of adenomatous and serrated colon polyps: Z86.0101

## 2020-11-01 HISTORY — DX: Personal history of nicotine dependence: Z87.891

## 2020-11-01 HISTORY — DX: Long term (current) use of aspirin: Z79.82

## 2020-11-01 HISTORY — DX: Personal history of (healed) traumatic fracture: Z87.81

## 2020-11-01 HISTORY — DX: Ventral hernia without obstruction or gangrene: K43.9

## 2020-11-01 HISTORY — DX: Other alveolar and parieto-alveolar conditions: J84.09

## 2020-11-01 HISTORY — DX: Generalized anxiety disorder: F41.1

## 2020-11-01 HISTORY — DX: Gastro-esophageal reflux disease without esophagitis: K21.9

## 2020-11-01 HISTORY — DX: Atherosclerosis of aorta: I70.0

## 2020-11-01 HISTORY — DX: Osteoarthritis of knee, unspecified: M17.9

## 2020-11-01 HISTORY — DX: Personal history of colonic polyps: Z86.010

## 2020-11-01 HISTORY — DX: Personal history of other diseases of urinary system: Z87.448

## 2020-11-01 NOTE — Progress Notes (Signed)
Office Note     CC:  follow up Requesting Provider:  Deland Pretty, MD  HPI: [name] is a 75 y.o. ([birth year]) male who presents for follow up of peripheral artery disease. He is s/p right to left fem- fem bypass with PTFE graft 01/13/2019. This was due to critical limb ischemia with occlusion of his left external iliac artery. He has done well post operatively. He reports no claudication, rest pain or non healing wounds. He does explain that his legs just don't feel quite normal to the touch, almost a funny sensation when you touch them. He says this has been present since his surgery. It is not painful and does not interfere with his daily activities. He continues to work almost every day.   He was in the hospital last week with abdominal pain, nausea and vomiting. He was found to have two ventral hernias, one causing a SBO. The hernias were able to be reduced with resolution of SBO and improvement of his symptoms. He is scheduled to follow up with general surgery for elective repair. He otherwise reports no new medical issues since his last visit  The pt is on a statin for cholesterol management.  The pt is on a daily aspirin.   Other AC: none The pt is not on medication for hypertension.   The pt is not diabetic.  Tobacco hx: Former, quit 1992  Past Medical History:  Diagnosis Date  . Depression   . GERD (gastroesophageal reflux disease)   . Hyperlipidemia   . Osteopenia     Past Surgical History:  Procedure Laterality Date  . FEMORAL-FEMORAL BYPASS GRAFT Left 01/13/2019   Procedure: BYPASS GRAFT FEMORAL-FEMORAL ARTERY;  Surgeon: Angelia Mould, MD;  Location: [facility];  Service: Vascular;  Laterality: Left;  . LUMBAR LAMINECTOMY    . ORCHIECTOMY Left 01/12/2019   Procedure: LEFT INGUINAL RADICAL ORCHIECTOMY;  Surgeon: Lucas Mallow, MD;  Location: [facility];  Service: Urology;  Laterality: Left;  . ROTATOR CUFF REPAIR Right   . SMALL INTESTINE  SURGERY     hernia repair mesh, got stitched to his small bowel apparently  . UMBILICAL HERNIA REPAIR      Social History   Socioeconomic History  . Marital status: Married    Spouse name: Mubarak Bevens  . Number of children: 2  . Years of education: 12th grade  . Highest education level: Not on file  Occupational History  . Occupation: grading  Tobacco Use  . Smoking status: Former Smoker    Packs/day: 1.00    Years: 28.00    Pack years: 28.00    Types: Cigarettes  . Smokeless tobacco: Never Used  . Tobacco comment: quit 1992  Vaping Use  . Vaping Use: Never used  Substance and Sexual Activity  . Alcohol use: Yes    Alcohol/week: 6.0 standard drinks    Types: 6 Cans of beer per week    Comment: beer occasional  . Drug use: Never  . Sexual activity: Not Currently  Other Topics Concern  . Not on file  Social History Narrative  . Not on file   Social Determinants of Health   Financial Resource Strain: Not on file  Food Insecurity: Not on file  Transportation Needs: Not on file  Physical Activity: Not on file  Stress: Not on file  Social Connections: Not on file  Intimate Partner Violence: Not on file    Family History  Problem Relation Age of Onset  .  Other Neg Hx        No FHx of small bowel obstruction    Current Outpatient Medications  Medication Sig Dispense Refill  . acetaminophen (TYLENOL) 500 MG tablet Take 500 mg by mouth every 6 (six) hours as needed for headache (pain).    Marland Kitchen aspirin EC 81 MG tablet Take 81 mg by mouth daily. Swallow whole.    . cholecalciferol (VITAMIN D) 25 MCG (1000 UT) tablet Take 1,000 Units by mouth at bedtime.     Marland Kitchen loratadine (CLARITIN) 10 MG tablet Take 10 mg by mouth daily. Patient used this medication for his allergies.    Marland Kitchen omeprazole (PRILOSEC OTC) 20 MG tablet Take 20 mg by mouth daily as needed (acid reflux).    . rosuvastatin (CRESTOR) 10 MG tablet Take 10 mg by mouth daily.    . sertraline (ZOLOFT) 50 MG tablet  Take 50 mg by mouth daily as needed (anxiety).    . calcium-vitamin D (OSCAL WITH D) 500-200 MG-UNIT tablet Take 1 tablet by mouth 2 (two) times daily for 10 days. 20 tablet 0   No current facility-administered medications for this visit.    No Known Allergies   REVIEW OF SYSTEMS:  [X]  denotes positive finding, [ ]  denotes negative finding Cardiac  Comments:  Chest pain or chest pressure:    Shortness of breath upon exertion: X   Short of breath when lying flat:    Irregular heart rhythm:        Vascular    Pain in calf, thigh, or hip brought on by ambulation:    Pain in feet at night that wakes you up from your sleep:     Blood clot in your veins:    Leg swelling:         Pulmonary    Oxygen at home:    Productive cough:     Wheezing:         Neurologic    Sudden weakness in arms or legs:     Sudden numbness in arms or legs:     Sudden onset of difficulty speaking or slurred speech:    Temporary loss of vision in one eye:     Problems with dizziness:         Gastrointestinal    Blood in stool:     Vomited blood:         Genitourinary    Burning when urinating:     Blood in urine:        Psychiatric    Major depression:         Hematologic    Bleeding problems:    Problems with blood clotting too easily:        Skin    Rashes or ulcers:        Constitutional    Fever or chills:      PHYSICAL EXAMINATION:  Vitals:   11/01/20 1042  BP: 116/78  Pulse: 63  Resp: 20  Temp: 98.1 F (36.7 C)  TempSrc: Temporal  SpO2: 98%  Weight: 220 lb 11.2 oz (100.1 kg)  Height: 6' (1.829 m)    General:  WDWN in NAD; vital signs documented above Gait: Normal HENT: WNL, normocephalic Pulmonary: normal non-labored breathing , without wheezing Cardiac: regular HR, without  Murmurs without carotid bruit Abdomen: soft, NT, no masses. Normal BS Vascular Exam/Pulses:  Right Left  Radial 2+ (normal) 2+ (normal)  Femoral 2+ (normal) 2+ (normal)  Popliteal 2+  (normal) 2+ (normal)  DP 2+ (normal) 2+ (normal)  PT 1+ (weak) 1+ (weak)   Extremities: without ischemic changes, without Gangrene , without cellulitis; without open wounds;  Musculoskeletal: no muscle wasting or atrophy  Neurologic: A&O X 3;  No focal weakness or paresthesias are detected Psychiatric:  The pt has Normal affect.   Non-Invasive Vascular Imaging:   +-------+-----------+-----------+------------+------------+  ABI/TBIToday's ABIToday's TBIPrevious ABIPrevious TBI  +-------+-----------+-----------+------------+------------+  Right 1.16    0.79    1.20    1.52      +-------+-----------+-----------+------------+------------+  Left  1.18    0.82    1.20    1.31      +-------+-----------+-----------+------------+------------+    Fem Fem Graft:  +------------------+--------+--------+---------+--------+           PSV cm/sStenosisWaveform Comments  +------------------+--------+--------+---------+--------+  Inflow      130       triphasic      +------------------+--------+--------+---------+--------+  Prox anastomosis 277       triphasic      +------------------+--------+--------+---------+--------+  Proximal graft  144       triphasic      +------------------+--------+--------+---------+--------+  Mid graft     105       triphasic      +------------------+--------+--------+---------+--------+  Distal graft   104       triphasic      +------------------+--------+--------+---------+--------+  Distal anastomosis133       triphasic      +------------------+--------+--------+---------+--------+  Outflow      76       triphasic      +------------------+--------+--------+---------+--------+    ASSESSMENT/PLAN:: 75 y.o. male here for follow up for peripheral artery disease. He is s/p  right to left fem- fem bypass with PTFR graft 01/13/2019 by Dr. Scot Dock. This was due to critical limb ischemia with occlusion of his left external iliac artery. He has done well post operatively. He reports no claudication, rest pain or non healing wounds. His duplex today shows triphasic flow throughout fem- fem bypass. His ABI and TBIs are essentially unchanged. I have encouraged him to continue his activity.  - he will continue his Aspirin and statin - Should he have new or worsening symptoms he knows to call for earlier follow up - He will follow up in 1 year with repeat Fem- Fem bypass duplex and ABIs   Karoline Caldwell, PA-C [facility] [phone]  Clinic MD:  Dr. Stanford Breed /Dr. Carlis Abbott

## 2020-11-13 IMAGING — CT CT ANGIOGRAPHY LOWER LEFT EXTREMITY
2 of 10 series · 12 of 46 positions shown, 16 images · IV contrast (ISOVUE)
Comparison: None.

CLINICAL DATA: 72-year-old male with left lower extremity pain,
numbness and coldness following left inguinal radical orchiectomy
performed yesterday.

EXAM:
CT ANGIOGRAPHY OF THE left lowerEXTREMITY
TECHNIQUE: Multidetector CT imaging of the left lowerwas performed using the
standard protocol during bolus administration of intravenous
contrast. Multiplanar CT image reconstructions and MIPs were
obtained to evaluate the vascular anatomy.
CONTRAST:  100mL OMNIPAQUE IOHEXOL 350 MG/ML SOLN

[Series 5: axial arterial upper · axial · arterial · 0.49mm/px · z∈[-1160,-160]mm · 11 of 396 slices shown, 15 images]
[im 42/396  soft-tissue]
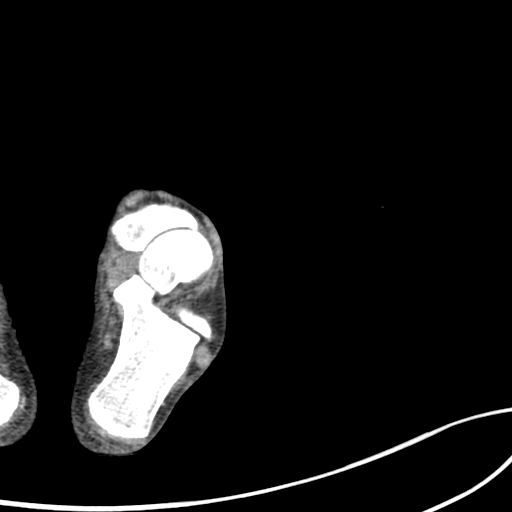
[im 42/396  bone]
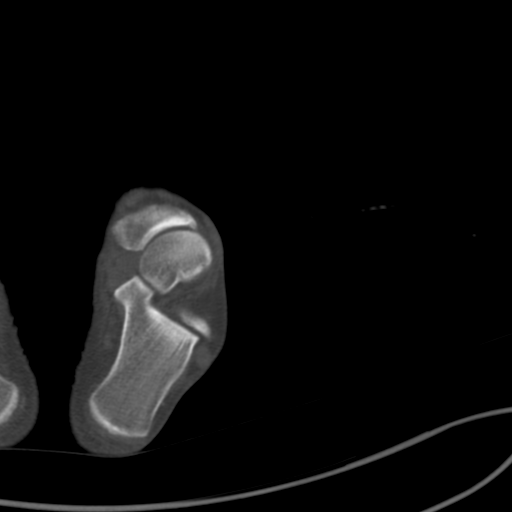
[im 84/396  soft-tissue]
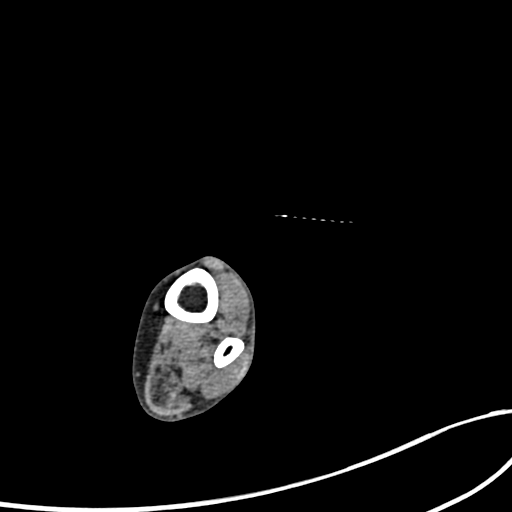
[im 125/396  soft-tissue]
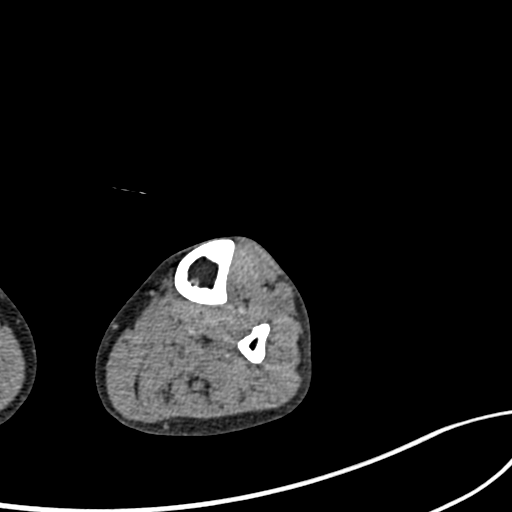
[im 167/396  soft-tissue]
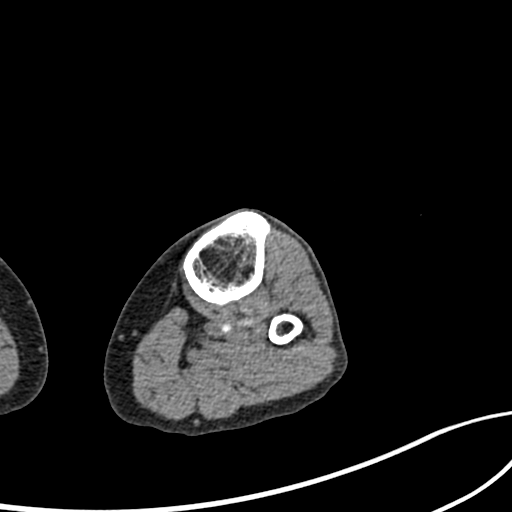
[im 208/396  soft-tissue]
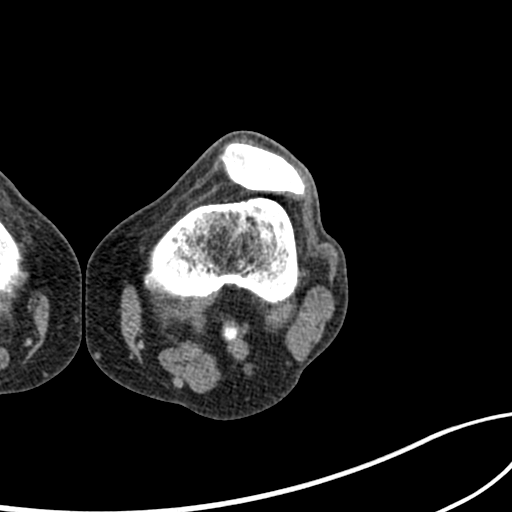
[im 250/396  soft-tissue]
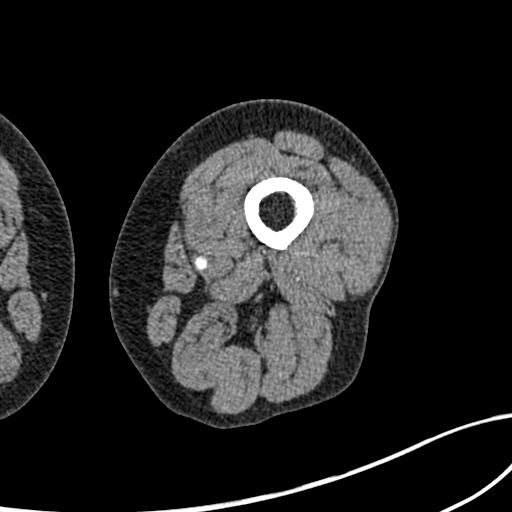
[im 292/396  soft-tissue]
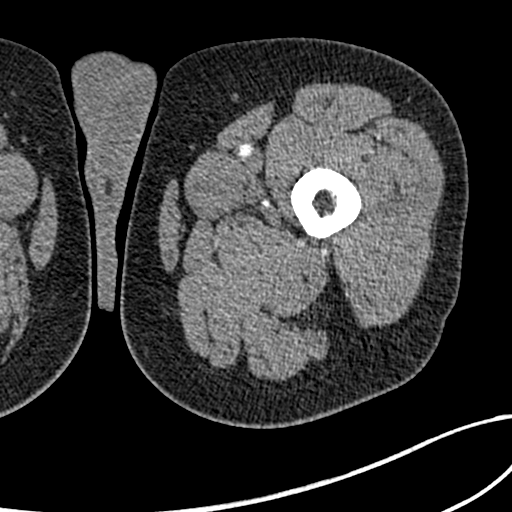
[im 312/396  lung]
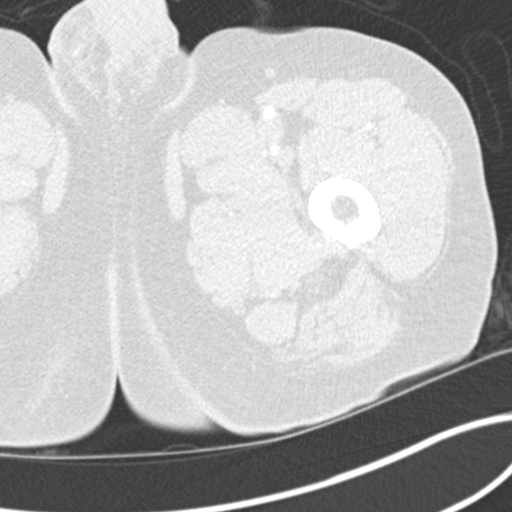
[im 333/396  soft-tissue]
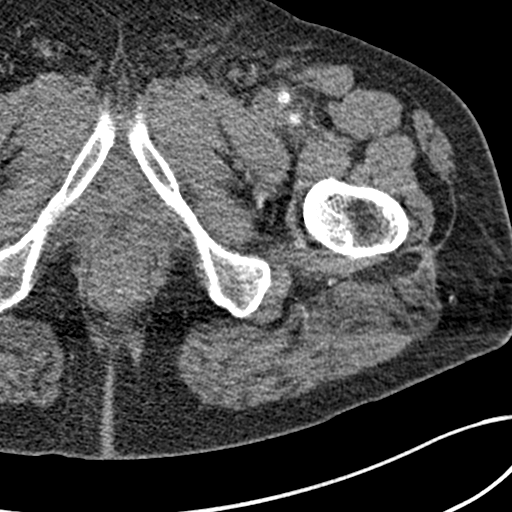
[im 333/396  lung]
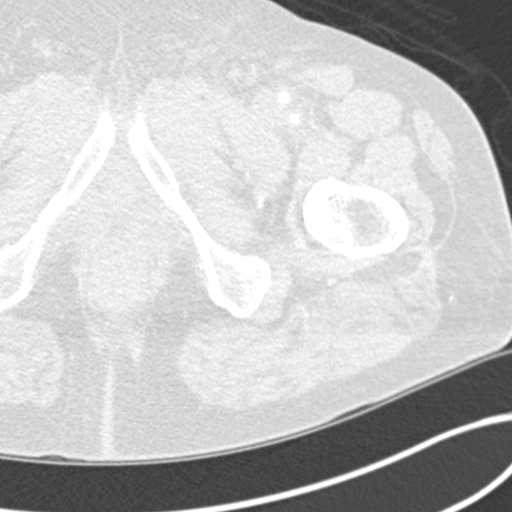
[im 354/396  lung]
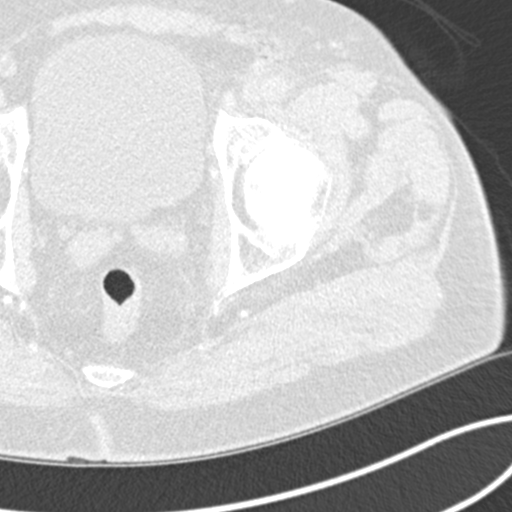
[im 375/396  soft-tissue]
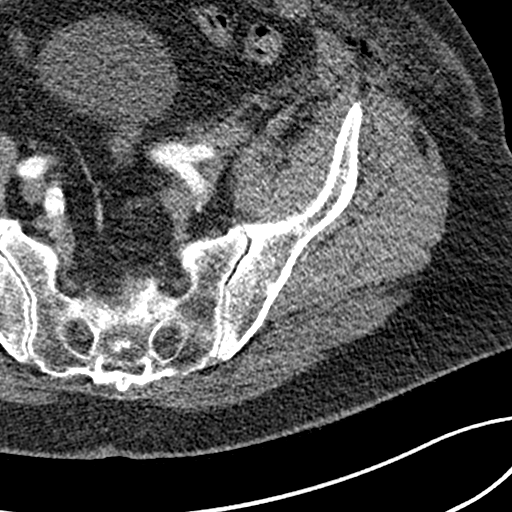
[im 375/396  lung]
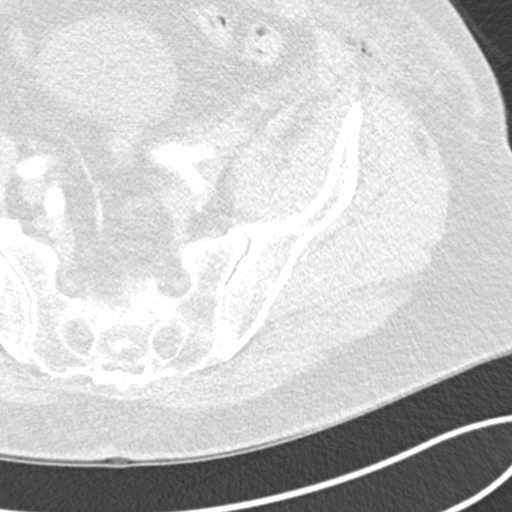
[im 375/396  bone]
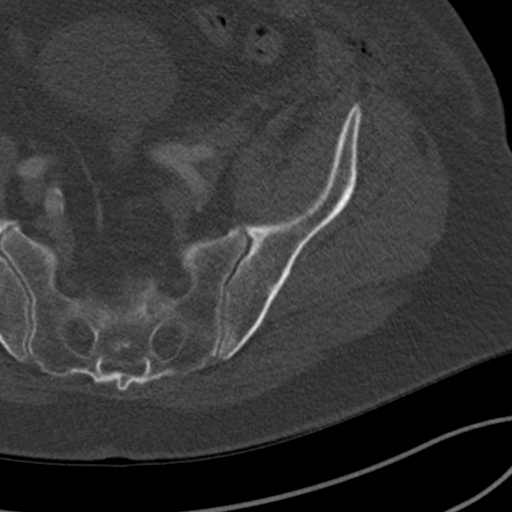

[Series 6: coronal upper · coronal · 0.57mm/px · 1 of 110 slices shown]
[im 55/110  soft-tissue]
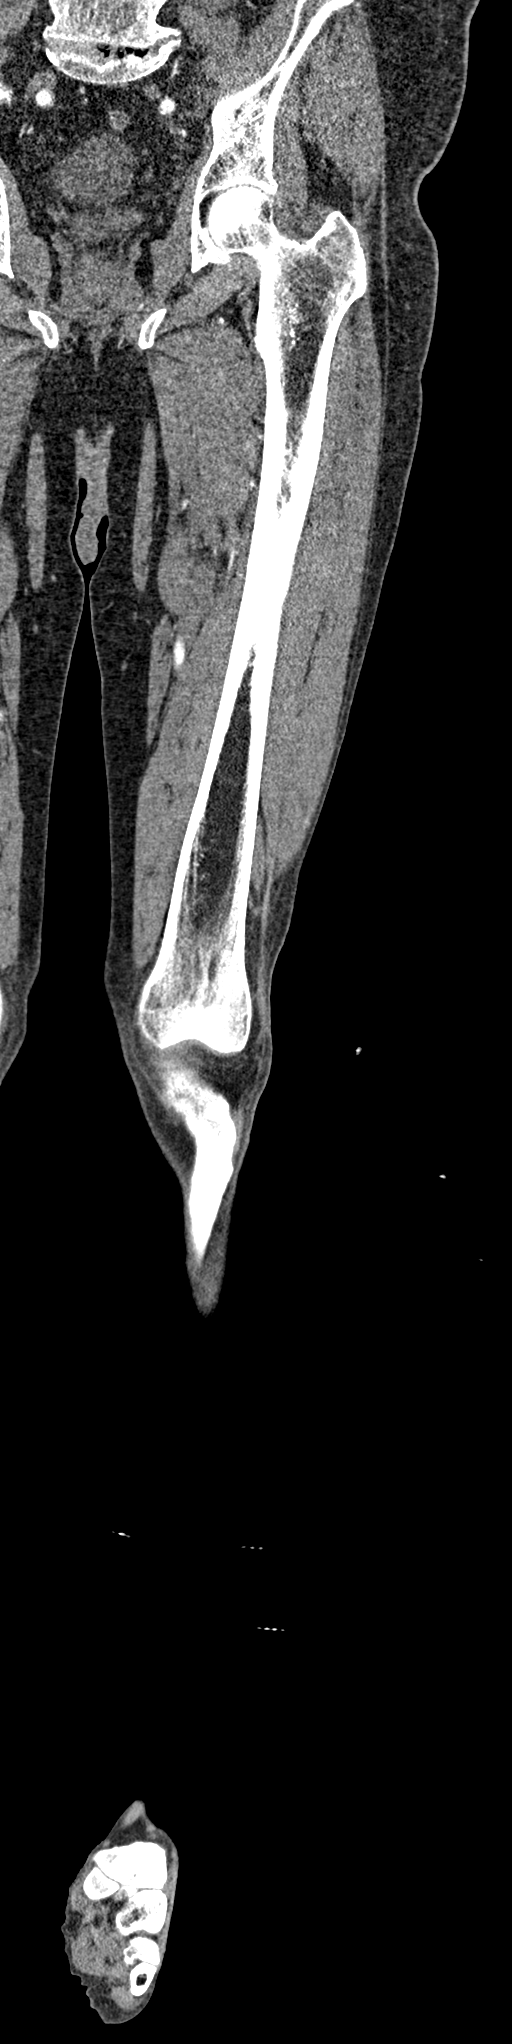

[12 of 46 positions shown; findings below may reference images not displayed]

FINDINGS: Inflow: Mild heterogeneous but largely calcified atherosclerotic
plaque without evidence of stenosis or occlusion in the common iliac
artery. The left internal iliac artery is widely patent. The left
external iliac artery is completely occluded approximately 1.5 cm
beyond the origin.

Outflow: The common femoral artery is also occluded but
reconstitutes just proximal to the bifurcation. The profunda femoral
branches are patent. The superficial femoral and popliteal artery
are widely patent.

Runoff: Patent to the ankle.

Review of the MIP images confirms the above findings.

Nonvascular: Expected postoperative changes in the left inguinal
region consistent with the reported history of recent left
orchiectomy. There is subcutaneous emphysema and a small amount of
intermediate attenuation material in the lateral inguinal recess
measuring approximately 2.3 x 1.6 cm likely representing focal
hematoma. Expected subcutaneous emphysema within the left
hemiscrotum and along the spermatic cord. Incompletely imaged lower
lumbar degenerative disc disease.
IMPRESSION: 1. Positive for acute appearing occlusion of the left external iliac
artery extending into the left common femoral artery with
reconstitution just cephalad to the common femoral bifurcation.
Suspect underlying dissection.
2. Additional expected postoperative changes with a very small
amount of hematoma in the left lateral inguinal recess.
3. Mild calcified atherosclerotic plaque without evidence of
stenosis.

These results were called by telephone at the time of interpretation
on 01/13/2019 at [DATE] to Dr. ANYUSHKA GARCIA ALVEO , who verbally acknowledged
these results.

## 2020-12-12 ENCOUNTER — Other Ambulatory Visit: Payer: Self-pay

## 2020-12-12 ENCOUNTER — Ambulatory Visit (INDEPENDENT_AMBULATORY_CARE_PROVIDER_SITE_OTHER): Payer: Medicare Other | Admitting: Dermatology

## 2020-12-12 ENCOUNTER — Encounter: Payer: Self-pay | Admitting: Dermatology

## 2020-12-12 DIAGNOSIS — D0439 Carcinoma in situ of skin of other parts of face: Secondary | ICD-10-CM | POA: Diagnosis not present

## 2020-12-12 DIAGNOSIS — I745 Embolism and thrombosis of iliac artery: Secondary | ICD-10-CM | POA: Diagnosis not present

## 2020-12-12 DIAGNOSIS — D485 Neoplasm of uncertain behavior of skin: Secondary | ICD-10-CM

## 2020-12-12 DIAGNOSIS — L570 Actinic keratosis: Secondary | ICD-10-CM | POA: Diagnosis not present

## 2020-12-12 DIAGNOSIS — Z86007 Personal history of in-situ neoplasm of skin: Secondary | ICD-10-CM

## 2020-12-12 DIAGNOSIS — Z1283 Encounter for screening for malignant neoplasm of skin: Secondary | ICD-10-CM | POA: Diagnosis not present

## 2020-12-19 ENCOUNTER — Telehealth: Payer: Self-pay

## 2020-12-19 NOTE — Telephone Encounter (Signed)
Phone call to patient with his pathology results. Patient aware of results.  

## 2020-12-19 NOTE — Telephone Encounter (Signed)
-----   Message from Lavonna Monarch, MD sent at 12/15/2020  7:17 AM EDT ----- Schedule surgery with Dr. Darene Lamer

## 2020-12-20 ENCOUNTER — Encounter: Payer: Self-pay | Admitting: Dermatology

## 2020-12-20 NOTE — Progress Notes (Signed)
   Follow-Up Visit   Subjective  [name] is a 75 y.o. male who presents for the following: Skin Problem (New lesion on left temple x 2 months- per PCP- no itch no bled- sore to touch).  Growing crust to left outer forehead. Location:  Duration:  Quality:  Associated Signs/Symptoms: Modifying Factors:  Severity:  Timing: Context: Several other skin issues to discuss today.  Objective  Well appearing patient in no apparent distress; mood and affect are within normal limits. Objective  Left Forehead: Waxy 7 mm crust with focal erosion in a field of diffuse actinic keratoses.  Also has a probable superficial carcinoma on his right lower abdomen which is currently not bothersome to Mr. Jowett and we will defer intervention.     Objective  Right Superior Helix: Four millimeter hornlike pink crust  Images    Objective  Mid Back: Waist up skin exam, no atypical pigmented lesions    All skin waist up examined.   Assessment & Plan    Neoplasm of uncertain behavior of skin Left Forehead  Skin / nail biopsy Type of biopsy: tangential   Informed consent: discussed and consent obtained   Timeout: patient name, date of birth, surgical site, and procedure verified   Procedure prep:  Patient was prepped and draped in usual sterile fashion (Non sterile) Prep type:  Chlorhexidine Anesthesia: the lesion was anesthetized in a standard fashion   Anesthetic:  1% lidocaine w/ epinephrine 1-100,000 local infiltration Instrument used: flexible razor blade   Outcome: patient tolerated procedure well   Post-procedure details: wound care instructions given    Specimen 1 - Surgical pathology Differential Diagnosis: bcc vs scc  Check Margins: No  AK (actinic keratosis) Right Superior Helix  Destruction of lesion - Right Superior Helix Complexity: simple   Destruction method: cryotherapy   Informed consent: discussed and consent obtained   Timeout:  patient name,  date of birth, surgical site, and procedure verified Lesion destroyed using liquid nitrogen: Yes   Cryotherapy cycles:  3 Outcome: patient tolerated procedure well with no complications    Encounter for screening for malignant neoplasm of skin Mid Back  Annual skin examination      I, Lavonna Monarch, MD, have reviewed all documentation for this visit.  The documentation on 12/20/20 for the exam, diagnosis, procedures, and orders are all accurate and complete.

## 2020-12-27 ENCOUNTER — Emergency Department (HOSPITAL_BASED_OUTPATIENT_CLINIC_OR_DEPARTMENT_OTHER): Payer: Medicare Other

## 2020-12-27 ENCOUNTER — Encounter (HOSPITAL_BASED_OUTPATIENT_CLINIC_OR_DEPARTMENT_OTHER): Payer: Self-pay | Admitting: Emergency Medicine

## 2020-12-27 ENCOUNTER — Inpatient Hospital Stay (HOSPITAL_BASED_OUTPATIENT_CLINIC_OR_DEPARTMENT_OTHER)
Admission: EM | Admit: 2020-12-27 | Discharge: 2021-01-04 | DRG: 354 | Disposition: A | Discharge status: Home / Self Care | Payer: Medicare Other | Attending: Family Medicine | Admitting: Family Medicine

## 2020-12-27 ENCOUNTER — Other Ambulatory Visit: Payer: Self-pay

## 2020-12-27 DIAGNOSIS — Z20822 Contact with and (suspected) exposure to covid-19: Secondary | ICD-10-CM | POA: Diagnosis not present

## 2020-12-27 DIAGNOSIS — K56609 Unspecified intestinal obstruction, unspecified as to partial versus complete obstruction: Secondary | ICD-10-CM | POA: Diagnosis not present

## 2020-12-27 DIAGNOSIS — K219 Gastro-esophageal reflux disease without esophagitis: Secondary | ICD-10-CM | POA: Diagnosis not present

## 2020-12-27 DIAGNOSIS — E785 Hyperlipidemia, unspecified: Secondary | ICD-10-CM | POA: Diagnosis not present

## 2020-12-27 DIAGNOSIS — I739 Peripheral vascular disease, unspecified: Secondary | ICD-10-CM | POA: Diagnosis present

## 2020-12-27 DIAGNOSIS — J849 Interstitial pulmonary disease, unspecified: Secondary | ICD-10-CM | POA: Diagnosis not present

## 2020-12-27 DIAGNOSIS — Z7982 Long term (current) use of aspirin: Secondary | ICD-10-CM

## 2020-12-27 DIAGNOSIS — I959 Hypotension, unspecified: Secondary | ICD-10-CM | POA: Diagnosis not present

## 2020-12-27 DIAGNOSIS — Z6829 Body mass index (BMI) 29.0-29.9, adult: Secondary | ICD-10-CM

## 2020-12-27 DIAGNOSIS — K432 Incisional hernia without obstruction or gangrene: Secondary | ICD-10-CM

## 2020-12-27 DIAGNOSIS — Z87891 Personal history of nicotine dependence: Secondary | ICD-10-CM

## 2020-12-27 DIAGNOSIS — F411 Generalized anxiety disorder: Secondary | ICD-10-CM | POA: Diagnosis present

## 2020-12-27 DIAGNOSIS — K420 Umbilical hernia with obstruction, without gangrene: Secondary | ICD-10-CM | POA: Diagnosis not present

## 2020-12-27 DIAGNOSIS — K56699 Other intestinal obstruction unspecified as to partial versus complete obstruction: Secondary | ICD-10-CM | POA: Diagnosis not present

## 2020-12-27 DIAGNOSIS — Z85828 Personal history of other malignant neoplasm of skin: Secondary | ICD-10-CM

## 2020-12-27 DIAGNOSIS — K5651 Intestinal adhesions [bands], with partial obstruction: Secondary | ICD-10-CM | POA: Diagnosis present

## 2020-12-27 DIAGNOSIS — J309 Allergic rhinitis, unspecified: Secondary | ICD-10-CM | POA: Diagnosis present

## 2020-12-27 DIAGNOSIS — M8580 Other specified disorders of bone density and structure, unspecified site: Secondary | ICD-10-CM | POA: Diagnosis present

## 2020-12-27 DIAGNOSIS — Z86010 Personal history of colonic polyps: Secondary | ICD-10-CM

## 2020-12-27 DIAGNOSIS — R140 Abdominal distension (gaseous): Secondary | ICD-10-CM

## 2020-12-27 DIAGNOSIS — K436 Other and unspecified ventral hernia with obstruction, without gangrene: Secondary | ICD-10-CM | POA: Diagnosis not present

## 2020-12-27 DIAGNOSIS — H25813 Combined forms of age-related cataract, bilateral: Secondary | ICD-10-CM | POA: Diagnosis present

## 2020-12-27 DIAGNOSIS — F32A Depression, unspecified: Secondary | ICD-10-CM | POA: Diagnosis present

## 2020-12-27 DIAGNOSIS — E663 Overweight: Secondary | ICD-10-CM | POA: Diagnosis present

## 2020-12-27 DIAGNOSIS — Z79899 Other long term (current) drug therapy: Secondary | ICD-10-CM

## 2020-12-27 DIAGNOSIS — K430 Incisional hernia with obstruction, without gangrene: Secondary | ICD-10-CM

## 2020-12-27 LAB — CBC WITH DIFFERENTIAL/PLATELET
Abs Immature Granulocytes: 0.02 10*3/uL (ref 0.00–0.07)
Basophils Absolute: 0 10*3/uL (ref 0.0–0.1)
Basophils Relative: 0 %
Eosinophils Absolute: 0.2 10*3/uL (ref 0.0–0.5)
Eosinophils Relative: 3 %
HCT: 43.2 % (ref 39.0–52.0)
Hemoglobin: 14.7 g/dL (ref 13.0–17.0)
Immature Granulocytes: 0 %
Lymphocytes Relative: 20 %
Lymphs Abs: 1.4 10*3/uL (ref 0.7–4.0)
MCH: 32 pg (ref 26.0–34.0)
MCHC: 34 g/dL (ref 30.0–36.0)
MCV: 94.1 fL (ref 80.0–100.0)
Monocytes Absolute: 1 10*3/uL (ref 0.1–1.0)
Monocytes Relative: 15 %
Neutro Abs: 4.3 10*3/uL (ref 1.7–7.7)
Neutrophils Relative %: 62 %
Platelets: 203 10*3/uL (ref 150–400)
RBC: 4.59 MIL/uL (ref 4.22–5.81)
RDW: 13.1 % (ref 11.5–15.5)
WBC: 7 10*3/uL (ref 4.0–10.5)
nRBC: 0 % (ref 0.0–0.2)

## 2020-12-27 LAB — COMPREHENSIVE METABOLIC PANEL
ALT: 17 U/L (ref 0–44)
AST: 26 U/L (ref 15–41)
Albumin: 3.8 g/dL (ref 3.5–5.0)
Alkaline Phosphatase: 90 U/L (ref 38–126)
Anion gap: 9 (ref 5–15)
BUN: 17 mg/dL (ref 8–23)
CO2: 24 mmol/L (ref 22–32)
Calcium: 9.3 mg/dL (ref 8.9–10.3)
Chloride: 101 mmol/L (ref 98–111)
Creatinine, Ser: 1.04 mg/dL (ref 0.61–1.24)
GFR, Estimated: >60 mL/min (ref >60)
Glucose, Bld: 136 mg/dL — ABNORMAL HIGH (ref 70–99)
Potassium: 4.2 mmol/L (ref 3.5–5.1)
Sodium: 134 mmol/L — ABNORMAL LOW (ref 135–145)
Total Bilirubin: 0.9 mg/dL (ref 0.3–1.2)
Total Protein: 7.5 g/dL (ref 6.5–8.1)

## 2020-12-27 LAB — LIPASE, BLOOD: Lipase: 26 U/L (ref 11–51)

## 2020-12-27 LAB — RESP PANEL BY RT-PCR (FLU A&B, COVID) ARPGX2
Influenza A by PCR: NEGATIVE
Influenza B by PCR: NEGATIVE
SARS Coronavirus 2 by RT PCR: NEGATIVE

## 2020-12-27 MED ORDER — LACTATED RINGERS IV BOLUS
1000.0000 mL | Freq: Once | INTRAVENOUS | Status: AC
  Administered 2020-12-27: 1000 mL via INTRAVENOUS

## 2020-12-27 MED ORDER — ONDANSETRON HCL 4 MG/2ML IJ SOLN
4.0000 mg | Freq: Once | INTRAMUSCULAR | Status: AC
  Administered 2020-12-27: 4 mg via INTRAVENOUS
  Filled 2020-12-27: qty 2

## 2020-12-27 NOTE — ED Provider Notes (Signed)
[facility] EMERGENCY DEPARTMENT Provider Note   CSN: [id number] Arrival date & time: 12/27/20  2014     History No chief complaint on file.   [name] is a 76 y.o. male.  Patient is a 76 year old male with a history of GERD, hyperlipidemia, small bowel obstruction from ventral hernia who is presenting today with persistent nausea and vomiting.  Patient last had something to eat at 7 PM last night and he was feeling his normal self when around 1030 he started vomiting.  He vomited approximately 3 times before 2 AM and then was able to go to sleep.  Throughout that time he was having some mild aching in his abdomen in the middle.  All day today every time he tries to eat or drink anything he will have emesis.  He has not been able to hold anything down.  He has 2 out of 10 abdominal pain in the central part of his abdomen which has not really gotten much worse.  It does not radiate.  He has had normal bowel movements with no diarrhea.  Denies alcohol use or prior history of pancreatitis.  No urinary complaints.  The pain in the abdomen has waxed and waned but currently is minimal.  He is still experiencing nausea.  No fever, cough, congestion or shortness of breath.  The history is provided by the patient.       Past Medical History:  Diagnosis Date  . Basal cell carcinoma 02/21/2011   left inner cheek, lateral (CX35FU)  . Depression   . GERD (gastroesophageal reflux disease)   . Hyperlipidemia   . Osteopenia   . Squamous cell carcinoma of skin 07/21/2019   in situ- fornt center scalp (CX35FU)    Patient Active Problem List   Diagnosis Date Noted  . Cramp in lower leg associated with rest 11/01/2020  . ED (erectile dysfunction) of organic origin 11/01/2020  . Epididymal cyst 11/01/2020  . Exacerbation of asthma 11/01/2020  . Family history of ischemic heart disease (IHD) 11/01/2020  . Family history of malignant neoplasm of gastrointestinal tract 11/01/2020   . Gastroesophageal reflux disease without esophagitis 11/01/2020  . Generalized anxiety disorder 11/01/2020  . Hardening of the aorta (main artery of the heart) ([place]) 11/01/2020  . Hearing loss 11/01/2020  . History of adenomatous polyp of colon 11/01/2020  . History of hematuria 11/01/2020  . Hyperlipidemia 11/01/2020  . Long term (current) use of aspirin 11/01/2020  . Osteoarthritis of knee 11/01/2020  . Osteopenia 11/01/2020  . Other skin changes due to chronic exposure to nonionizing radiation 11/01/2020  . 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 Peripheral vascular disease ([place]) 11/01/2020  . Personal history of (healed) traumatic fracture 11/01/2020  . Personal history of other malignant neoplasm of skin 11/01/2020  . Stopped smoking 11/01/2020  . Ventral hernia 11/01/2020  . Body mass index (BMI) 30.0-30.9, adult 11/01/2020  . SBO (small bowel obstruction) ([place]) 10/26/2020  . Allergic rhinitis 07/12/2020  . ILD (interstitial lung disease) ([place]) 07/12/2020  . Healthcare maintenance 07/12/2020  . DOE (dyspnea on exertion) 02/03/2020  . Combined forms of age-related cataract of left eye 01/26/2020  . Combined forms of age-related cataract of right eye 01/14/2020  . Iliac artery occlusion, left ([place]) 01/13/2019  . Right knee pain 03/02/2015    Past Surgical History:  Procedure Laterality Date  . FEMORAL-FEMORAL BYPASS GRAFT Left 01/13/2019   Procedure: BYPASS GRAFT FEMORAL-FEMORAL ARTERY;  Surgeon: Angelia Mould, MD;  Location: [facility];  Service: Vascular;  Laterality: Left;  . LUMBAR LAMINECTOMY    . ORCHIECTOMY Left 01/12/2019   Procedure: LEFT INGUINAL RADICAL ORCHIECTOMY;  Surgeon: Lucas Mallow, MD;  Location: [facility];  Service: Urology;  Laterality: Left;  . ROTATOR CUFF REPAIR Right   . SMALL INTESTINE SURGERY     hernia repair mesh, got stitched to his small bowel apparently  . UMBILICAL HERNIA REPAIR         Family History   Problem Relation Age of Onset  . Other Neg Hx        No FHx of small bowel obstruction    Social History   Tobacco Use  . Smoking status: Former Smoker    Packs/day: 1.00    Years: 28.00    Pack years: 28.00    Types: Cigarettes  . Smokeless tobacco: Never Used  . Tobacco comment: quit 1992  Vaping Use  . Vaping Use: Never used  Substance Use Topics  . Alcohol use: Yes    Alcohol/week: 6.0 standard drinks    Types: 6 Cans of beer per week    Comment: beer occasional  . Drug use: Never    Home Medications Prior to Admission medications   Medication Sig Start Date End Date Taking? Authorizing Provider  omeprazole (PRILOSEC OTC) 20 MG tablet Take 20 mg by mouth daily as needed (acid reflux).   Yes [provider]  rosuvastatin (CRESTOR) 10 MG tablet Take 10 mg by mouth daily. 09/27/20  Yes [provider]  sertraline (ZOLOFT) 50 MG tablet Take 50 mg by mouth daily as needed (anxiety).   Yes [provider]  acetaminophen (TYLENOL) 500 MG tablet Take 500 mg by mouth every 6 (six) hours as needed for headache (pain).    [provider]  aspirin EC 81 MG tablet Take 81 mg by mouth daily. Swallow whole.    [provider]  calcium-vitamin D (OSCAL WITH D) 500-200 MG-UNIT tablet Take 1 tablet by mouth 2 (two) times daily for 10 days. 02/02/19 02/12/19  Mesner, Corene Cornea, MD  cholecalciferol (VITAMIN D) 25 MCG (1000 UT) tablet Take 1,000 Units by mouth at bedtime.     [provider]  COVID-19 mRNA vaccine, Pfizer, 30 MCG/0.3ML injection INJECT AS DIRECTED 09/13/20 09/13/21  Carlyle Basques, MD  loratadine (CLARITIN) 10 MG tablet Take 10 mg by mouth daily. Patient used this medication for his allergies.    [provider]    Allergies    Patient has no known allergies.  Review of Systems   Review of Systems  All other systems reviewed and are negative.   Physical Exam Updated Vital Signs BP 122/80 (BP Location: Right Arm)    Pulse 85   Temp 98.7 F (37.1 C) (Oral)   Resp 18   Ht 6' (1.829 m)   Wt 99.8 kg   SpO2 94%   BMI 29.84 kg/m   Physical Exam Vitals and nursing note reviewed.  Constitutional:      General: He is not in acute distress.    Appearance: Normal appearance. He is well-developed.  HENT:     Head: Normocephalic and atraumatic.  Eyes:     Conjunctiva/sclera: Conjunctivae normal.     Pupils: Pupils are equal, round, and reactive to light.  Cardiovascular:     Rate and Rhythm: Normal rate and regular rhythm.     Heart sounds: No murmur heard.   Pulmonary:     Effort: Pulmonary effort is normal.  No respiratory distress.     Breath sounds: Normal breath sounds. No wheezing or rales.  Abdominal:     General: Bowel sounds are normal. There is no distension.     Palpations: Abdomen is soft.     Tenderness: There is abdominal tenderness. There is no right CVA tenderness, left CVA tenderness, guarding or rebound.     Hernia: A hernia is present.     Comments: Soft reducible hernia.  Mild left lower quadrant pain  Musculoskeletal:        General: No tenderness. Normal range of motion.     Cervical back: Normal range of motion and neck supple.     Right lower leg: No edema.     Left lower leg: No edema.  Skin:    General: Skin is warm and dry.     Capillary Refill: Capillary refill takes less than 2 seconds.     Findings: No erythema or rash.  Neurological:     Mental Status: He is alert and oriented to person, place, and time. Mental status is at baseline.  Psychiatric:        Mood and Affect: Mood normal.        Behavior: Behavior normal.     ED Results / Procedures / Treatments   Labs (all labs ordered are listed, but only abnormal results are displayed) Labs Reviewed  COMPREHENSIVE METABOLIC PANEL - Abnormal; Notable for the following components:      Result Value   Sodium 134 (*)    Glucose, Bld 136 (*)    All other components within normal limits  CBC WITH  DIFFERENTIAL/PLATELET  LIPASE, BLOOD  URINALYSIS, ROUTINE W REFLEX MICROSCOPIC    EKG None  Radiology CT ABDOMEN PELVIS WO CONTRAST  Result Date: 12/27/2020 CLINICAL DATA:  Acute, nonlocalized abdominal pain and vomiting. EXAM: CT ABDOMEN AND PELVIS WITHOUT CONTRAST TECHNIQUE: Multidetector CT imaging of the abdomen and pelvis was performed following the standard protocol without IV contrast. COMPARISON:  10/26/2020 FINDINGS: Lower chest: Mild bibasilar pulmonary fibrotic change. The visualized heart and pericardium are unremarkable. Hepatobiliary: No focal liver abnormality is seen. No gallstones, gallbladder wall thickening, or biliary dilatation. Pancreas: Unremarkable Spleen: Unremarkable Adrenals/Urinary Tract: Adrenal glands are unremarkable. Kidneys are normal, without renal calculi, focal lesion, or hydronephrosis. Bladder is unremarkable. Stomach/Bowel: A mid small bowel obstruction is present with the transition point representing a single loop of a small-bowel within a complex supraumbilical ventral hernia. This is best seen on axial image # 43 and sagittal image # 58. The proximal small bowel is mildly distended with gas and fluid. The distal small bowel is decompressed. Partial small bowel resection has been performed. There is no free intraperitoneal gas or fluid. The stomach, distal small bowel, and colon are unremarkable. The appendix is unremarkable. Vascular/Lymphatic: Moderate aortoiliac atherosclerotic calcification is present. The distal left external iliac artery appears stenotic, though this is not well assessed on this noncontrast examination. Femoral-femoral bypass grafting has been performed. No pathologic adenopathy within the abdomen and pelvis. Reproductive: Prostate is unremarkable. Other: Complex supraumbilical ventral hernia is present containing at least 3 separate hernia sacs containing mesenteric fat as well as the single loop of small bowel resulting in the small-bowel  obstruction described above. Additionally, a a moderate fat containing left inguinal hernia is present with the hernia sac draped around the bypass graft. The rectum is unremarkable. Musculoskeletal: No lytic or blastic bone lesion. Remote appearing compression fracture of T11 is noted with approximately 20-30% loss of  height. An age indeterminate compression fracture of T10 is identified demonstrating 30-40% loss of height. There is no retropulsion associated with either of these fractures. Advanced degenerative changes are noted within the lumbar spine. IMPRESSION: Mid small bowel obstruction secondary to a herniated loop of small bowel within a complex supraumbilical ventral hernia. Complex ventral hernia contains at least 3 separate hernia sacs containing mesenteric fat as well as the point of transition within the mid small bowel. Peripheral vascular disease. Femoral femoral bypass grafting noted. Stenosis of the distal left external iliac artery is suspected, though not well assessed on this noncontrast examination. Remote T11 compression fracture. Age indeterminate T10 compression fracture with 30-40% loss of height. Correlation for point tenderness may be helpful in helping determine acuity. If indicated, this could be better assessed with MRI examination. No associated retropulsion with either of these fractures. Aortic Atherosclerosis (ICD10-I70.0). Electronically Signed   By: Fidela Salisbury MD   On: 12/27/2020 22:28    Procedures Procedures   Medications Ordered in ED Medications  lactated ringers bolus 1,000 mL (has no administration in time range)  ondansetron (ZOFRAN) injection 4 mg (has no administration in time range)    ED Course  I have reviewed the triage vital signs and the nursing notes.  Pertinent labs & imaging results that were available during my care of the patient were reviewed by me and considered in my medical decision making (see chart for details).    MDM  Rules/Calculators/A&P                          Elderly male presenting today with persistent nausea and vomiting.  Patient does have history of ventral hernia with prior small bowel obstruction but has been having persistent nausea and vomiting.  He has had normal bowel movements today but has not been able to eat or drink.  He denies any respiratory or cardiac symptoms.  Denies any infectious symptoms.  Concern for possible acute abdominal pathology that could be causing the vomiting.  Concern for possible volvulus  Or SBO or other acute abdominal pathology.  His hernia appears soft but is large and does not fully reduce.  Patient given IV fluids nausea medication.  Labs and imaging pending.  CBC, CMP and lipase wnl.  CT shows mid small bowel obstruction secondary to a herniated loop of small bowel within a complex supraumbilical ventral hernia.  Complex ventral hernia contains at least 3 separate hernia sacs containing mesenteric fat as well as the point of transition within the mid small bowel.  No other acute findings at this time.  Given patient's ongoing vomiting will discuss with general surgery.  MDM Number of Diagnoses or Management Options   Amount and/or Complexity of Data Reviewed Clinical lab tests: ordered and reviewed Tests in the radiology section of CPT: ordered and reviewed Independent visualization of images, tracings, or specimens: yes  Patient Progress Patient progress: stable   Final Clinical Impression(s) / ED Diagnoses Final diagnoses:  Incarcerated ventral hernia  Small bowel obstruction ([place])    Rx / DC Orders ED Discharge Orders    None       Blanchie Dessert, MD 12/27/20 2250

## 2020-12-27 NOTE — ED Notes (Addendum)
Pt returned from CT, resting comfortably, unable to urinate at this time, urinal at bedside.

## 2020-12-27 NOTE — ED Triage Notes (Signed)
Unable to eat or drink since yesterday keep vomiting with abdominal pain.

## 2020-12-28 ENCOUNTER — Encounter (HOSPITAL_COMMUNITY)
Admission: EM | Disposition: A | Discharge status: Home / Self Care | Payer: Self-pay | Source: Home / Self Care | Attending: Internal Medicine

## 2020-12-28 ENCOUNTER — Inpatient Hospital Stay (HOSPITAL_COMMUNITY): Payer: Medicare Other | Admitting: Certified Registered"

## 2020-12-28 ENCOUNTER — Encounter (HOSPITAL_COMMUNITY): Payer: Self-pay | Admitting: Family Medicine

## 2020-12-28 DIAGNOSIS — K6389 Other specified diseases of intestine: Secondary | ICD-10-CM | POA: Diagnosis not present

## 2020-12-28 DIAGNOSIS — K432 Incisional hernia without obstruction or gangrene: Secondary | ICD-10-CM

## 2020-12-28 DIAGNOSIS — K5651 Intestinal adhesions [bands], with partial obstruction: Secondary | ICD-10-CM | POA: Diagnosis present

## 2020-12-28 DIAGNOSIS — E663 Overweight: Secondary | ICD-10-CM | POA: Diagnosis present

## 2020-12-28 DIAGNOSIS — Z86010 Personal history of colonic polyps: Secondary | ICD-10-CM | POA: Diagnosis not present

## 2020-12-28 DIAGNOSIS — M4186 Other forms of scoliosis, lumbar region: Secondary | ICD-10-CM | POA: Diagnosis not present

## 2020-12-28 DIAGNOSIS — M8580 Other specified disorders of bone density and structure, unspecified site: Secondary | ICD-10-CM | POA: Diagnosis present

## 2020-12-28 DIAGNOSIS — K436 Other and unspecified ventral hernia with obstruction, without gangrene: Secondary | ICD-10-CM | POA: Diagnosis not present

## 2020-12-28 DIAGNOSIS — J309 Allergic rhinitis, unspecified: Secondary | ICD-10-CM | POA: Diagnosis present

## 2020-12-28 DIAGNOSIS — K439 Ventral hernia without obstruction or gangrene: Secondary | ICD-10-CM | POA: Diagnosis not present

## 2020-12-28 DIAGNOSIS — E785 Hyperlipidemia, unspecified: Secondary | ICD-10-CM | POA: Diagnosis present

## 2020-12-28 DIAGNOSIS — K430 Incisional hernia with obstruction, without gangrene: Secondary | ICD-10-CM

## 2020-12-28 DIAGNOSIS — K219 Gastro-esophageal reflux disease without esophagitis: Secondary | ICD-10-CM | POA: Diagnosis present

## 2020-12-28 DIAGNOSIS — J849 Interstitial pulmonary disease, unspecified: Secondary | ICD-10-CM | POA: Diagnosis present

## 2020-12-28 DIAGNOSIS — F418 Other specified anxiety disorders: Secondary | ICD-10-CM | POA: Diagnosis not present

## 2020-12-28 DIAGNOSIS — F411 Generalized anxiety disorder: Secondary | ICD-10-CM | POA: Diagnosis present

## 2020-12-28 DIAGNOSIS — I959 Hypotension, unspecified: Secondary | ICD-10-CM | POA: Diagnosis not present

## 2020-12-28 DIAGNOSIS — Z6829 Body mass index (BMI) 29.0-29.9, adult: Secondary | ICD-10-CM | POA: Diagnosis not present

## 2020-12-28 DIAGNOSIS — Z7982 Long term (current) use of aspirin: Secondary | ICD-10-CM | POA: Diagnosis not present

## 2020-12-28 DIAGNOSIS — K56609 Unspecified intestinal obstruction, unspecified as to partial versus complete obstruction: Secondary | ICD-10-CM | POA: Diagnosis not present

## 2020-12-28 DIAGNOSIS — K469 Unspecified abdominal hernia without obstruction or gangrene: Secondary | ICD-10-CM | POA: Diagnosis not present

## 2020-12-28 DIAGNOSIS — Z85828 Personal history of other malignant neoplasm of skin: Secondary | ICD-10-CM | POA: Diagnosis not present

## 2020-12-28 DIAGNOSIS — Z79899 Other long term (current) drug therapy: Secondary | ICD-10-CM | POA: Diagnosis not present

## 2020-12-28 DIAGNOSIS — M47816 Spondylosis without myelopathy or radiculopathy, lumbar region: Secondary | ICD-10-CM | POA: Diagnosis not present

## 2020-12-28 DIAGNOSIS — I739 Peripheral vascular disease, unspecified: Secondary | ICD-10-CM | POA: Diagnosis present

## 2020-12-28 DIAGNOSIS — H25813 Combined forms of age-related cataract, bilateral: Secondary | ICD-10-CM | POA: Diagnosis present

## 2020-12-28 DIAGNOSIS — Z20822 Contact with and (suspected) exposure to covid-19: Secondary | ICD-10-CM | POA: Diagnosis present

## 2020-12-28 DIAGNOSIS — Z87891 Personal history of nicotine dependence: Secondary | ICD-10-CM | POA: Diagnosis not present

## 2020-12-28 DIAGNOSIS — K56699 Other intestinal obstruction unspecified as to partial versus complete obstruction: Secondary | ICD-10-CM | POA: Diagnosis not present

## 2020-12-28 DIAGNOSIS — R140 Abdominal distension (gaseous): Secondary | ICD-10-CM | POA: Diagnosis not present

## 2020-12-28 DIAGNOSIS — N189 Chronic kidney disease, unspecified: Secondary | ICD-10-CM | POA: Diagnosis not present

## 2020-12-28 DIAGNOSIS — K4090 Unilateral inguinal hernia, without obstruction or gangrene, not specified as recurrent: Secondary | ICD-10-CM | POA: Diagnosis not present

## 2020-12-28 DIAGNOSIS — F32A Depression, unspecified: Secondary | ICD-10-CM | POA: Diagnosis present

## 2020-12-28 HISTORY — DX: Incisional hernia with obstruction, without gangrene: K43.0

## 2020-12-28 HISTORY — PX: INGUINAL HERNIA REPAIR: SHX194

## 2020-12-28 HISTORY — DX: Other and unspecified ventral hernia with obstruction, without gangrene: K43.6

## 2020-12-28 HISTORY — DX: Incisional hernia without obstruction or gangrene: K43.2

## 2020-12-28 LAB — COMPREHENSIVE METABOLIC PANEL
ALT: 17 U/L (ref 0–44)
AST: 22 U/L (ref 15–41)
Albumin: 3.5 g/dL (ref 3.5–5.0)
Alkaline Phosphatase: 74 U/L (ref 38–126)
Anion gap: 6 (ref 5–15)
BUN: 18 mg/dL (ref 8–23)
CO2: 28 mmol/L (ref 22–32)
Calcium: 9.1 mg/dL (ref 8.9–10.3)
Chloride: 103 mmol/L (ref 98–111)
Creatinine, Ser: 1.03 mg/dL (ref 0.61–1.24)
GFR, Estimated: >60 mL/min (ref >60)
Glucose, Bld: 120 mg/dL — ABNORMAL HIGH (ref 70–99)
Potassium: 4.6 mmol/L (ref 3.5–5.1)
Sodium: 137 mmol/L (ref 135–145)
Total Bilirubin: 0.7 mg/dL (ref 0.3–1.2)
Total Protein: 6.4 g/dL — ABNORMAL LOW (ref 6.5–8.1)

## 2020-12-28 LAB — URINALYSIS, ROUTINE W REFLEX MICROSCOPIC
Bacteria, UA: NONE SEEN
Bilirubin Urine: NEGATIVE
Glucose, UA: NEGATIVE mg/dL
Ketones, ur: NEGATIVE mg/dL
Leukocytes,Ua: NEGATIVE
Nitrite: NEGATIVE
Protein, ur: NEGATIVE mg/dL
Specific Gravity, Urine: 1.013 (ref 1.005–1.030)
pH: 6 (ref 5.0–8.0)

## 2020-12-28 LAB — CBC WITH DIFFERENTIAL/PLATELET
Abs Immature Granulocytes: 0.01 10*3/uL (ref 0.00–0.07)
Basophils Absolute: 0 10*3/uL (ref 0.0–0.1)
Basophils Relative: 0 %
Eosinophils Absolute: 0.2 10*3/uL (ref 0.0–0.5)
Eosinophils Relative: 3 %
HCT: 40 % (ref 39.0–52.0)
Hemoglobin: 13.1 g/dL (ref 13.0–17.0)
Immature Granulocytes: 0 %
Lymphocytes Relative: 33 %
Lymphs Abs: 2.1 10*3/uL (ref 0.7–4.0)
MCH: 32 pg (ref 26.0–34.0)
MCHC: 32.8 g/dL (ref 30.0–36.0)
MCV: 97.6 fL (ref 80.0–100.0)
Monocytes Absolute: 1 10*3/uL (ref 0.1–1.0)
Monocytes Relative: 15 %
Neutro Abs: 3.2 10*3/uL (ref 1.7–7.7)
Neutrophils Relative %: 49 %
Platelets: 187 10*3/uL (ref 150–400)
RBC: 4.1 MIL/uL — ABNORMAL LOW (ref 4.22–5.81)
RDW: 13.1 % (ref 11.5–15.5)
WBC: 6.5 10*3/uL (ref 4.0–10.5)
nRBC: 0 % (ref 0.0–0.2)

## 2020-12-28 LAB — SURGICAL PCR SCREEN
MRSA, PCR: NEGATIVE
Staphylococcus aureus: POSITIVE — AB

## 2020-12-28 SURGERY — REPAIR, HERNIA, INGUINAL, LAPAROSCOPIC
Anesthesia: General

## 2020-12-28 MED ORDER — CHLORHEXIDINE GLUCONATE CLOTH 2 % EX PADS: 6.0000 | MEDICATED_PAD | Freq: Once | CUTANEOUS | Status: DC

## 2020-12-28 MED ORDER — SODIUM CHLORIDE 0.9% FLUSH: 3.0000 mL | INTRAVENOUS | Status: DC | PRN

## 2020-12-28 MED ORDER — FENTANYL CITRATE (PF) 100 MCG/2ML IJ SOLN
INTRAMUSCULAR | Status: AC
  Filled 2020-12-28: qty 2

## 2020-12-28 MED ORDER — BISACODYL 10 MG RE SUPP: 10.0000 mg | Freq: Two times a day (BID) | RECTAL | Status: DC | PRN

## 2020-12-28 MED ORDER — EPHEDRINE SULFATE-NACL 50-0.9 MG/10ML-% IV SOSY
PREFILLED_SYRINGE | INTRAVENOUS | Status: DC | PRN
  Administered 2020-12-28: 5 mg via INTRAVENOUS

## 2020-12-28 MED ORDER — FENTANYL CITRATE (PF) 250 MCG/5ML IJ SOLN
INTRAMUSCULAR | Status: AC
  Filled 2020-12-28: qty 5

## 2020-12-28 MED ORDER — BUPIVACAINE-EPINEPHRINE 0.25% -1:200000 IJ SOLN
INTRAMUSCULAR | Status: DC | PRN
  Administered 2020-12-28: 50 mL

## 2020-12-28 MED ORDER — SUCCINYLCHOLINE CHLORIDE 200 MG/10ML IV SOSY
PREFILLED_SYRINGE | INTRAVENOUS | Status: DC | PRN
  Administered 2020-12-28: 140 mg via INTRAVENOUS

## 2020-12-28 MED ORDER — LIDOCAINE 2% (20 MG/ML) 5 ML SYRINGE
INTRAMUSCULAR | Status: AC
  Filled 2020-12-28: qty 5

## 2020-12-28 MED ORDER — ENSURE PRE-SURGERY PO LIQD
592.0000 mL | Freq: Once | ORAL | Status: DC
  Filled 2020-12-28: qty 592

## 2020-12-28 MED ORDER — ACETAMINOPHEN 500 MG PO TABS
1000.0000 mg | ORAL_TABLET | Freq: Four times a day (QID) | ORAL | Status: DC
  Administered 2020-12-28 – 2021-01-04 (×25): 1000 mg via ORAL
  Filled 2020-12-28 (×25): qty 2

## 2020-12-28 MED ORDER — ENSURE PRE-SURGERY PO LIQD: 296.0000 mL | Freq: Once | ORAL | Status: DC

## 2020-12-28 MED ORDER — HYDROMORPHONE HCL 1 MG/ML IJ SOLN
0.5000 mg | Freq: Once | INTRAMUSCULAR | Status: AC
  Administered 2020-12-28: 0.5 mg via INTRAVENOUS
  Filled 2020-12-28: qty 1

## 2020-12-28 MED ORDER — MENTHOL 3 MG MT LOZG
1.0000 | LOZENGE | OROMUCOSAL | Status: DC | PRN
Start: 2020-12-28 — End: 2021-01-04
  Administered 2021-01-04: 3 mg via ORAL
  Filled 2020-12-28: qty 9

## 2020-12-28 MED ORDER — DEXTROSE IN LACTATED RINGERS 5 % IV SOLN: INTRAVENOUS | Status: AC

## 2020-12-28 MED ORDER — METOPROLOL TARTRATE 5 MG/5ML IV SOLN: 5.0000 mg | Freq: Four times a day (QID) | INTRAVENOUS | Status: DC | PRN

## 2020-12-28 MED ORDER — ACETAMINOPHEN 500 MG PO TABS
1000.0000 mg | ORAL_TABLET | ORAL | Status: AC
  Administered 2020-12-28: 1000 mg via ORAL
  Filled 2020-12-28: qty 2

## 2020-12-28 MED ORDER — METHOCARBAMOL 1000 MG/10ML IJ SOLN
1000.0000 mg | Freq: Four times a day (QID) | INTRAVENOUS | Status: DC | PRN
  Filled 2020-12-28: qty 10

## 2020-12-28 MED ORDER — ONDANSETRON HCL 4 MG/2ML IJ SOLN
4.0000 mg | Freq: Four times a day (QID) | INTRAMUSCULAR | Status: DC | PRN
  Administered 2021-01-01 – 2021-01-03 (×4): 4 mg via INTRAVENOUS
  Filled 2020-12-28 (×4): qty 2

## 2020-12-28 MED ORDER — BUPIVACAINE LIPOSOME 1.3 % IJ SUSP
INTRAMUSCULAR | Status: DC | PRN
  Administered 2020-12-28: 20 mL

## 2020-12-28 MED ORDER — CEFAZOLIN SODIUM-DEXTROSE 2-4 GM/100ML-% IV SOLN
2.0000 g | INTRAVENOUS | Status: AC
  Administered 2020-12-28: 2 g via INTRAVENOUS
  Filled 2020-12-28: qty 100

## 2020-12-28 MED ORDER — CEFAZOLIN SODIUM-DEXTROSE 2-4 GM/100ML-% IV SOLN
2.0000 g | Freq: Three times a day (TID) | INTRAVENOUS | Status: AC
  Administered 2020-12-28 – 2020-12-29 (×2): 2 g via INTRAVENOUS
  Filled 2020-12-28 (×2): qty 100

## 2020-12-28 MED ORDER — SERTRALINE HCL 50 MG PO TABS
50.0000 mg | ORAL_TABLET | Freq: Every day | ORAL | Status: DC | PRN
  Administered 2020-12-29 – 2021-01-04 (×5): 50 mg via ORAL
  Filled 2020-12-28 (×5): qty 1

## 2020-12-28 MED ORDER — PROCHLORPERAZINE EDISYLATE 10 MG/2ML IJ SOLN
5.0000 mg | INTRAMUSCULAR | Status: DC | PRN
  Administered 2021-01-01 – 2021-01-03 (×2): 10 mg via INTRAVENOUS
  Filled 2020-12-28 (×2): qty 2

## 2020-12-28 MED ORDER — LACTATED RINGERS IV SOLN: INTRAVENOUS | Status: DC | PRN

## 2020-12-28 MED ORDER — DIPHENHYDRAMINE HCL 50 MG/ML IJ SOLN
12.5000 mg | Freq: Four times a day (QID) | INTRAMUSCULAR | Status: DC | PRN
Start: 2020-12-28 — End: 2020-12-28

## 2020-12-28 MED ORDER — BUPIVACAINE LIPOSOME 1.3 % IJ SUSP
20.0000 mL | Freq: Once | INTRAMUSCULAR | Status: DC
  Filled 2020-12-28: qty 20

## 2020-12-28 MED ORDER — LIDOCAINE 2% (20 MG/ML) 5 ML SYRINGE
INTRAMUSCULAR | Status: DC | PRN
  Administered 2020-12-28: 40 mg via INTRAVENOUS

## 2020-12-28 MED ORDER — FENTANYL CITRATE (PF) 100 MCG/2ML IJ SOLN: 25.0000 ug | INTRAMUSCULAR | Status: DC | PRN

## 2020-12-28 MED ORDER — METHOCARBAMOL 1000 MG/10ML IJ SOLN
500.0000 mg | Freq: Three times a day (TID) | INTRAVENOUS | Status: DC
  Administered 2020-12-28 – 2021-01-04 (×19): 500 mg via INTRAVENOUS
  Filled 2020-12-28 (×19): qty 500

## 2020-12-28 MED ORDER — CHLORHEXIDINE GLUCONATE CLOTH 2 % EX PADS
6.0000 | MEDICATED_PAD | Freq: Every day | CUTANEOUS | Status: AC
  Administered 2020-12-29 – 2021-01-02 (×4): 6 via TOPICAL

## 2020-12-28 MED ORDER — DIPHENHYDRAMINE HCL 50 MG/ML IJ SOLN: 12.5000 mg | Freq: Four times a day (QID) | INTRAMUSCULAR | Status: DC | PRN

## 2020-12-28 MED ORDER — SIMETHICONE 40 MG/0.6ML PO SUSP
80.0000 mg | Freq: Four times a day (QID) | ORAL | Status: DC | PRN
  Filled 2020-12-28: qty 1.2

## 2020-12-28 MED ORDER — CALCIUM POLYCARBOPHIL 625 MG PO TABS
625.0000 mg | ORAL_TABLET | Freq: Two times a day (BID) | ORAL | Status: DC
  Administered 2020-12-28 – 2021-01-02 (×10): 625 mg via ORAL
  Filled 2020-12-28 (×10): qty 1

## 2020-12-28 MED ORDER — SODIUM CHLORIDE 0.9 % IV SOLN: 250.0000 mL | INTRAVENOUS | Status: DC | PRN

## 2020-12-28 MED ORDER — EPHEDRINE SULFATE-NACL 50-0.9 MG/10ML-% IV SOSY
PREFILLED_SYRINGE | INTRAVENOUS | Status: DC | PRN
  Administered 2020-12-28 (×3): 5 mg via INTRAVENOUS

## 2020-12-28 MED ORDER — ONDANSETRON HCL 4 MG/2ML IJ SOLN: 4.0000 mg | Freq: Once | INTRAMUSCULAR | Status: DC | PRN

## 2020-12-28 MED ORDER — MORPHINE SULFATE (PF) 2 MG/ML IV SOLN: 1.0000 mg | INTRAVENOUS | Status: DC | PRN

## 2020-12-28 MED ORDER — GABAPENTIN 300 MG PO CAPS
300.0000 mg | ORAL_CAPSULE | ORAL | Status: AC
  Administered 2020-12-28: 300 mg via ORAL
  Filled 2020-12-28: qty 1

## 2020-12-28 MED ORDER — SODIUM CHLORIDE 0.9% FLUSH
3.0000 mL | Freq: Two times a day (BID) | INTRAVENOUS | Status: DC
  Administered 2020-12-29 – 2021-01-01 (×5): 3 mL via INTRAVENOUS

## 2020-12-28 MED ORDER — 0.9 % SODIUM CHLORIDE (POUR BTL) OPTIME
TOPICAL | Status: DC | PRN
  Administered 2020-12-28: 1000 mL

## 2020-12-28 MED ORDER — OXYCODONE HCL 5 MG PO TABS: 5.0000 mg | ORAL_TABLET | Freq: Once | ORAL | Status: DC | PRN

## 2020-12-28 MED ORDER — ENALAPRILAT 1.25 MG/ML IV SOLN
0.6250 mg | Freq: Four times a day (QID) | INTRAVENOUS | Status: DC | PRN
  Filled 2020-12-28: qty 1

## 2020-12-28 MED ORDER — FENTANYL CITRATE (PF) 250 MCG/5ML IJ SOLN
INTRAMUSCULAR | Status: DC | PRN
  Administered 2020-12-28 (×5): 50 ug via INTRAVENOUS
  Administered 2020-12-28: 100 ug via INTRAVENOUS
  Administered 2020-12-28: 50 ug via INTRAVENOUS

## 2020-12-28 MED ORDER — PROPOFOL 10 MG/ML IV BOLUS
INTRAVENOUS | Status: DC | PRN
  Administered 2020-12-28: 160 mg via INTRAVENOUS

## 2020-12-28 MED ORDER — ACETAMINOPHEN 325 MG PO TABS: 325.0000 mg | ORAL_TABLET | Freq: Four times a day (QID) | ORAL | Status: DC | PRN

## 2020-12-28 MED ORDER — LIP MEDEX EX OINT
1.0000 "application " | TOPICAL_OINTMENT | Freq: Two times a day (BID) | CUTANEOUS | Status: DC
  Administered 2020-12-28 – 2021-01-04 (×15): 1 via TOPICAL
  Filled 2020-12-28 (×2): qty 7

## 2020-12-28 MED ORDER — PANTOPRAZOLE SODIUM 40 MG PO TBEC
40.0000 mg | DELAYED_RELEASE_TABLET | Freq: Every day | ORAL | Status: DC
  Administered 2020-12-28 – 2021-01-04 (×8): 40 mg via ORAL
  Filled 2020-12-28 (×8): qty 1

## 2020-12-28 MED ORDER — LACTATED RINGERS IV BOLUS: 1000.0000 mL | Freq: Three times a day (TID) | INTRAVENOUS | Status: AC | PRN

## 2020-12-28 MED ORDER — METRONIDAZOLE 500 MG/100ML IV SOLN
500.0000 mg | INTRAVENOUS | Status: AC
  Administered 2020-12-28: 500 mg via INTRAVENOUS
  Filled 2020-12-28: qty 100

## 2020-12-28 MED ORDER — BUPIVACAINE-EPINEPHRINE 0.25% -1:200000 IJ SOLN
INTRAMUSCULAR | Status: AC
  Filled 2020-12-28: qty 1

## 2020-12-28 MED ORDER — ASPIRIN EC 81 MG PO TBEC
81.0000 mg | DELAYED_RELEASE_TABLET | Freq: Every day | ORAL | Status: DC
  Administered 2020-12-28 – 2021-01-04 (×8): 81 mg via ORAL
  Filled 2020-12-28 (×8): qty 1

## 2020-12-28 MED ORDER — PROPOFOL 10 MG/ML IV BOLUS
INTRAVENOUS | Status: AC
  Filled 2020-12-28: qty 20

## 2020-12-28 MED ORDER — GABAPENTIN 300 MG PO CAPS
300.0000 mg | ORAL_CAPSULE | Freq: Every day | ORAL | Status: AC
  Administered 2020-12-28 – 2020-12-30 (×3): 300 mg via ORAL
  Filled 2020-12-28 (×3): qty 1

## 2020-12-28 MED ORDER — ALUM & MAG HYDROXIDE-SIMETH 200-200-20 MG/5ML PO SUSP
30.0000 mL | Freq: Four times a day (QID) | ORAL | Status: DC | PRN
Start: 2020-12-28 — End: 2021-01-04

## 2020-12-28 MED ORDER — ROCURONIUM BROMIDE 10 MG/ML (PF) SYRINGE
PREFILLED_SYRINGE | INTRAVENOUS | Status: DC | PRN
  Administered 2020-12-28 (×2): 20 mg via INTRAVENOUS
  Administered 2020-12-28: 40 mg via INTRAVENOUS

## 2020-12-28 MED ORDER — ONDANSETRON HCL 4 MG/2ML IJ SOLN
INTRAMUSCULAR | Status: AC
  Filled 2020-12-28: qty 2

## 2020-12-28 MED ORDER — OXYCODONE HCL 5 MG/5ML PO SOLN: 5.0000 mg | Freq: Once | ORAL | Status: DC | PRN

## 2020-12-28 MED ORDER — HYDROMORPHONE HCL 1 MG/ML IJ SOLN
0.5000 mg | INTRAMUSCULAR | Status: DC | PRN
  Administered 2020-12-28 – 2020-12-30 (×3): 1 mg via INTRAVENOUS
  Filled 2020-12-28 (×3): qty 1

## 2020-12-28 MED ORDER — DEXAMETHASONE SODIUM PHOSPHATE 10 MG/ML IJ SOLN
INTRAMUSCULAR | Status: DC | PRN
  Administered 2020-12-28: 4 mg via INTRAVENOUS

## 2020-12-28 MED ORDER — DEXAMETHASONE SODIUM PHOSPHATE 10 MG/ML IJ SOLN
INTRAMUSCULAR | Status: AC
  Filled 2020-12-28: qty 1

## 2020-12-28 MED ORDER — MAGIC MOUTHWASH
15.0000 mL | Freq: Four times a day (QID) | ORAL | Status: DC | PRN
  Filled 2020-12-28: qty 15

## 2020-12-28 MED ORDER — ROCURONIUM BROMIDE 10 MG/ML (PF) SYRINGE
PREFILLED_SYRINGE | INTRAVENOUS | Status: AC
  Filled 2020-12-28: qty 10

## 2020-12-28 MED ORDER — SUGAMMADEX SODIUM 500 MG/5ML IV SOLN
INTRAVENOUS | Status: DC | PRN
  Administered 2020-12-28: 300 mg via INTRAVENOUS

## 2020-12-28 MED ORDER — ROSUVASTATIN CALCIUM 10 MG PO TABS
10.0000 mg | ORAL_TABLET | Freq: Every day | ORAL | Status: DC
  Administered 2020-12-28 – 2021-01-04 (×8): 10 mg via ORAL
  Filled 2020-12-28 (×8): qty 1

## 2020-12-28 MED ORDER — ONDANSETRON HCL 4 MG/2ML IJ SOLN
INTRAMUSCULAR | Status: DC | PRN
  Administered 2020-12-28: 4 mg via INTRAVENOUS

## 2020-12-28 MED ORDER — PHENOL 1.4 % MT LIQD
2.0000 | OROMUCOSAL | Status: DC | PRN
  Filled 2020-12-28: qty 177

## 2020-12-28 MED ORDER — CHLORHEXIDINE GLUCONATE CLOTH 2 % EX PADS
6.0000 | MEDICATED_PAD | Freq: Once | CUTANEOUS | Status: AC
  Administered 2020-12-28: 6 via TOPICAL

## 2020-12-28 MED ORDER — SODIUM CHLORIDE 0.9 % IV SOLN
8.0000 mg | Freq: Four times a day (QID) | INTRAVENOUS | Status: DC | PRN
  Filled 2020-12-28: qty 4

## 2020-12-28 MED ORDER — MUPIROCIN 2 % EX OINT
1.0000 "application " | TOPICAL_OINTMENT | Freq: Two times a day (BID) | CUTANEOUS | Status: AC
  Administered 2020-12-28 – 2021-01-01 (×10): 1 via NASAL
  Filled 2020-12-28 (×2): qty 22

## 2020-12-28 SURGICAL SUPPLY — 42 items
APL PRP STRL LF DISP 70% ISPRP (MISCELLANEOUS) ×1
CABLE HIGH FREQUENCY MONO STRZ (ELECTRODE) ×2 IMPLANT
CHLORAPREP W/TINT 26 (MISCELLANEOUS) ×2 IMPLANT
COVER SURGICAL LIGHT HANDLE (MISCELLANEOUS) ×2 IMPLANT
COVER WAND RF STERILE (DRAPES) IMPLANT
DECANTER SPIKE VIAL GLASS SM (MISCELLANEOUS) ×2 IMPLANT
DEVICE SECURE STRAP 25 ABSORB (INSTRUMENTS) IMPLANT
DRAPE WARM FLUID 44X44 (DRAPES) ×2 IMPLANT
DRSG TEGADERM 2-3/8X2-3/4 SM (GAUZE/BANDAGES/DRESSINGS) ×4 IMPLANT
DRSG TEGADERM 4X4.75 (GAUZE/BANDAGES/DRESSINGS) ×2 IMPLANT
DRSG TEGADERM 6X8 (GAUZE/BANDAGES/DRESSINGS) ×1 IMPLANT
ELECT REM PT RETURN 15FT ADLT (MISCELLANEOUS) ×2 IMPLANT
GAUZE SPONGE 2X2 8PLY STRL LF (GAUZE/BANDAGES/DRESSINGS) ×1 IMPLANT
GLOVE SURG LTX SZ8 (GLOVE) ×2 IMPLANT
GLOVE SURG UNDER LTX SZ8 (GLOVE) ×2 IMPLANT
GOWN STRL REUS W/TWL XL LVL3 (GOWN DISPOSABLE) ×4 IMPLANT
IRRIG SUCT STRYKERFLOW 2 WTIP (MISCELLANEOUS)
IRRIGATION SUCT STRKRFLW 2 WTP (MISCELLANEOUS) IMPLANT
KIT BASIN OR (CUSTOM PROCEDURE TRAY) ×2 IMPLANT
KIT TURNOVER KIT A (KITS) ×2 IMPLANT
MESH VENTRALIGHT ST 8X10 (Mesh General) ×1 IMPLANT
NDL INSUFFLATION 14GA 120MM (NEEDLE) IMPLANT
NEEDLE INSUFFLATION 14GA 120MM (NEEDLE) IMPLANT
PAD POSITIONING PINK XL (MISCELLANEOUS) ×2 IMPLANT
SCISSORS LAP 5X35 DISP (ENDOMECHANICALS) ×2 IMPLANT
SET TUBE SMOKE EVAC HIGH FLOW (TUBING) ×2 IMPLANT
SLEEVE ADV FIXATION 5X100MM (TROCAR) ×3 IMPLANT
SPONGE GAUZE 2X2 8PLY STRL LF (GAUZE/BANDAGES/DRESSINGS) ×1 IMPLANT
SPONGE GAUZE 2X2 STER 10/PKG (GAUZE/BANDAGES/DRESSINGS) ×1
SUT MNCRL AB 4-0 PS2 18 (SUTURE) ×2 IMPLANT
SUT PDS AB 1 CT1 27 (SUTURE) ×4 IMPLANT
SUT PROLENE 1 CT 1 30 (SUTURE) ×6 IMPLANT
SUT VIC AB 2-0 SH 27 (SUTURE)
SUT VIC AB 2-0 SH 27X BRD (SUTURE) IMPLANT
SUT VICRYL 0 UR6 27IN ABS (SUTURE) ×2 IMPLANT
TACKER 5MM HERNIA 3.5CML NAB (ENDOMECHANICALS) IMPLANT
TOWEL OR 17X26 10 PK STRL BLUE (TOWEL DISPOSABLE) ×2 IMPLANT
TOWEL OR NON WOVEN STRL DISP B (DISPOSABLE) ×2 IMPLANT
TRAY LAPAROSCOPIC (CUSTOM PROCEDURE TRAY) ×2 IMPLANT
TROCAR ADV FIXATION 5X100MM (TROCAR) ×2 IMPLANT
TROCAR XCEL BLUNT TIP 100MML (ENDOMECHANICALS) ×2 IMPLANT
TROCAR XCEL NON-BLD 11X100MML (ENDOMECHANICALS) ×1 IMPLANT

## 2020-12-28 NOTE — H&P (Signed)
History and Physical    [name]:[id number] DOB: 06/01/1946 DOA: 12/27/2020  PCP: Deland Pretty, MD   Patient coming from: Home.  Chief Complaint: Abdominal pain with nausea vomiting.  HPI: [name] is a 75 y.o. male with history of hyperlipidemia, GERD was admitted in March 2022 for small bowel obstruction presented to the ER with complaints of having abdominal pain and nausea vomiting over the last 48 hours.  Pain has been generalized all over the abdomen.  Last bowel movement was 48 hours ago.  ED Course: CT abdomen pelvis done in the ER shows features consistent with small bowel obstruction with the loop of bowel inside of complicated ventral hernia.  On-call surgeon Dr. Georgette Dover was notified and patient admitted for further management.  Labs are unremarkable.  COVID testing is negative.  Review of Systems: As per HPI, rest all negative.   Past Medical History:  Diagnosis Date  . Basal cell carcinoma 02/21/2011   left inner cheek, lateral (CX35FU)  . Depression   . GERD (gastroesophageal reflux disease)   . Hyperlipidemia   . Osteopenia   . Squamous cell carcinoma of skin 07/21/2019   in situ- fornt center scalp (CX35FU)    Past Surgical History:  Procedure Laterality Date  . FEMORAL-FEMORAL BYPASS GRAFT Left 01/13/2019   Procedure: BYPASS GRAFT FEMORAL-FEMORAL ARTERY;  Surgeon: Angelia Mould, MD;  Location: [facility];  Service: Vascular;  Laterality: Left;  . LUMBAR LAMINECTOMY    . ORCHIECTOMY Left 01/12/2019   Procedure: LEFT INGUINAL RADICAL ORCHIECTOMY;  Surgeon: Lucas Mallow, MD;  Location: [facility];  Service: Urology;  Laterality: Left;  . ROTATOR CUFF REPAIR Right   . SMALL INTESTINE SURGERY     hernia repair mesh, got stitched to his small bowel apparently  . UMBILICAL HERNIA REPAIR       reports that he has quit smoking. His smoking use included cigarettes. He has a 28.00 pack-year smoking history. He has never used  smokeless tobacco. He reports current alcohol use of about 6.0 standard drinks of alcohol per week. He reports that he does not use drugs.  No Known Allergies  Family History  Problem Relation Age of Onset  . Other Neg Hx        No FHx of small bowel obstruction    Prior to Admission medications   Medication Sig Start Date End Date Taking? Authorizing Provider  acetaminophen (TYLENOL) 500 MG tablet Take 500 mg by mouth every 6 (six) hours as needed for headache (pain).   Yes [provider]  aspirin EC 81 MG tablet Take 81 mg by mouth daily. Swallow whole.   Yes [provider]  cholecalciferol (VITAMIN D) 25 MCG (1000 UT) tablet Take 1,000 Units by mouth at bedtime.    Yes [provider]  omeprazole (PRILOSEC) 10 MG capsule Take 10 mg by mouth every morning. 12/01/20  Yes [provider]  rosuvastatin (CRESTOR) 10 MG tablet Take 10 mg by mouth daily. 09/27/20  Yes [provider]  sertraline (ZOLOFT) 50 MG tablet Take 50 mg by mouth daily as needed (anxiety).   Yes [provider]  COVID-19 mRNA vaccine, Pfizer, 30 MCG/0.3ML injection INJECT AS DIRECTED 09/13/20 09/13/21  Carlyle Basques, MD    Physical Exam: Constitutional: Moderately built and nourished. Vitals:   12/27/20 2315 12/27/20 2330 12/28/20 0038 12/28/20 0149  BP:  92/64 103/75 107/72  Pulse: 70 72 74 79  Resp:  18 18  15  Temp:    98.4 F (36.9 C)  TempSrc:    Oral  SpO2: 97% 92% 93% 93%  Weight:      Height:       Eyes: Anicteric no pallor. ENMT: No discharge from the ears eyes nose and mouth. Neck: No mass felt.  No neck rigidity. Respiratory: No rhonchi or crepitations. Cardiovascular: S1-S2 heard. Abdomen: Soft nontender bowel sounds not appreciated.  No guarding or rigidity. Musculoskeletal: No edema. Skin: No rash. Neurologic: Alert awake oriented to time place and person.  Moves all extremities. Psychiatric: Appears normal.  Normal affect.   Labs on  Admission: I have personally reviewed following labs and imaging studies  CBC: Recent Labs  Lab 12/27/20 2135  WBC 7.0  NEUTROABS 4.3  HGB 14.7  HCT 43.2  MCV 94.1  PLT 409   Basic Metabolic Panel: Recent Labs  Lab 12/27/20 2135  NA 134*  K 4.2  CL 101  CO2 24  GLUCOSE 136*  BUN 17  CREATININE 1.04  CALCIUM 9.3   GFR: Estimated Creatinine Clearance: 73.9 mL/min (by C-G formula based on SCr of 1.04 mg/dL). Liver Function Tests: Recent Labs  Lab 12/27/20 2135  AST 26  ALT 17  ALKPHOS 90  BILITOT 0.9  PROT 7.5  ALBUMIN 3.8   Recent Labs  Lab 12/27/20 2135  LIPASE 26   No results for input(s): AMMONIA in the last 168 hours. Coagulation Profile: No results for input(s): INR, PROTIME in the last 168 hours. Cardiac Enzymes: No results for input(s): CKTOTAL, CKMB, CKMBINDEX, TROPONINI in the last 168 hours. BNP (last 3 results) No results for input(s): PROBNP in the last 8760 hours. HbA1C: No results for input(s): HGBA1C in the last 72 hours. CBG: No results for input(s): GLUCAP in the last 168 hours. Lipid Profile: No results for input(s): CHOL, HDL, LDLCALC, TRIG, CHOLHDL, LDLDIRECT in the last 72 hours. Thyroid Function Tests: No results for input(s): TSH, T4TOTAL, FREET4, T3FREE, THYROIDAB in the last 72 hours. Anemia Panel: No results for input(s): VITAMINB12, FOLATE, FERRITIN, TIBC, IRON, RETICCTPCT in the last 72 hours. Urine analysis:    Component Value Date/Time   COLORURINE YELLOW 10/27/2020 0056   APPEARANCEUR CLEAR 10/27/2020 0056   LABSPEC 1.031 (H) 10/27/2020 0056   PHURINE 5.0 10/27/2020 0056   GLUCOSEU NEGATIVE 10/27/2020 0056   HGBUR SMALL (A) 10/27/2020 0056   BILIRUBINUR NEGATIVE 10/27/2020 0056   KETONESUR NEGATIVE 10/27/2020 0056   PROTEINUR NEGATIVE 10/27/2020 0056   UROBILINOGEN 1.0 06/05/2008 1751   NITRITE NEGATIVE 10/27/2020 0056   LEUKOCYTESUR NEGATIVE 10/27/2020 0056   Sepsis  Labs: @LABRCNTIP (procalcitonin:4,lacticidven:4) ) Recent Results (from the past 240 hour(s))  Resp Panel by RT-PCR (Flu A&B, Covid) Nasopharyngeal Swab     Status: None   Collection Time: 12/27/20 10:59 PM   Specimen: Nasopharyngeal Swab; Nasopharyngeal(NP) swabs in vial transport medium  Result Value Ref Range Status   SARS Coronavirus 2 by RT PCR NEGATIVE NEGATIVE Final    Comment: (NOTE) SARS-CoV-2 target nucleic acids are NOT DETECTED.  The SARS-CoV-2 RNA is generally detectable in upper respiratory specimens during the acute phase of infection. The lowest concentration of SARS-CoV-2 viral copies this assay can detect is 138 copies/mL. A negative result does not preclude SARS-Cov-2 infection and should not be used as the sole basis for treatment or other patient management decisions. A negative result may occur with  improper specimen collection/handling, submission of specimen other than nasopharyngeal swab, presence of viral mutation(s) within the areas targeted  by this assay, and inadequate number of viral copies(<138 copies/mL). A negative result must be combined with clinical observations, patient history, and epidemiological information. The expected result is Negative.  Fact Sheet for Patients:  EntrepreneurPulse.com.au  Fact Sheet for Healthcare Providers:  IncredibleEmployment.be  This test is no t yet approved or cleared by the [place] FDA and  has been authorized for detection and/or diagnosis of SARS-CoV-2 by FDA under an Emergency Use Authorization (EUA). This EUA will remain  in effect (meaning this test can be used) for the duration of the COVID-19 declaration under Section 564(b)(1) of the Act, 21 U.S.C.section 360bbb-3(b)(1), unless the authorization is terminated  or revoked sooner.       Influenza A by PCR NEGATIVE NEGATIVE Final   Influenza B by PCR NEGATIVE NEGATIVE Final    Comment: (NOTE) The Xpert Xpress  SARS-CoV-2/FLU/RSV plus assay is intended as an aid in the diagnosis of influenza from Nasopharyngeal swab specimens and should not be used as a sole basis for treatment. Nasal washings and aspirates are unacceptable for Xpert Xpress SARS-CoV-2/FLU/RSV testing.  Fact Sheet for Patients: EntrepreneurPulse.com.au  Fact Sheet for Healthcare Providers: IncredibleEmployment.be  This test is not yet approved or cleared by the [place] FDA and has been authorized for detection and/or diagnosis of SARS-CoV-2 by FDA under an Emergency Use Authorization (EUA). This EUA will remain in effect (meaning this test can be used) for the duration of the COVID-19 declaration under Section 564(b)(1) of the Act, 21 U.S.C. section 360bbb-3(b)(1), unless the authorization is terminated or revoked.  Performed at [facility], [address] [place], [place] [address]      Radiological Exams on Admission: CT ABDOMEN PELVIS WO CONTRAST  Result Date: 12/27/2020 CLINICAL DATA:  Acute, nonlocalized abdominal pain and vomiting. EXAM: CT ABDOMEN AND PELVIS WITHOUT CONTRAST TECHNIQUE: Multidetector CT imaging of the abdomen and pelvis was performed following the standard protocol without IV contrast. COMPARISON:  10/26/2020 FINDINGS: Lower chest: Mild bibasilar pulmonary fibrotic change. The visualized heart and pericardium are unremarkable. Hepatobiliary: No focal liver abnormality is seen. No gallstones, gallbladder wall thickening, or biliary dilatation. Pancreas: Unremarkable Spleen: Unremarkable Adrenals/Urinary Tract: Adrenal glands are unremarkable. Kidneys are normal, without renal calculi, focal lesion, or hydronephrosis. Bladder is unremarkable. Stomach/Bowel: A mid small bowel obstruction is present with the transition point representing a single loop of a small-bowel within a complex supraumbilical ventral hernia. This is best seen on axial image # 43 and  sagittal image # 58. The proximal small bowel is mildly distended with gas and fluid. The distal small bowel is decompressed. Partial small bowel resection has been performed. There is no free intraperitoneal gas or fluid. The stomach, distal small bowel, and colon are unremarkable. The appendix is unremarkable. Vascular/Lymphatic: Moderate aortoiliac atherosclerotic calcification is present. The distal left external iliac artery appears stenotic, though this is not well assessed on this noncontrast examination. Femoral-femoral bypass grafting has been performed. No pathologic adenopathy within the abdomen and pelvis. Reproductive: Prostate is unremarkable. Other: Complex supraumbilical ventral hernia is present containing at least 3 separate hernia sacs containing mesenteric fat as well as the single loop of small bowel resulting in the small-bowel obstruction described above. Additionally, a a moderate fat containing left inguinal hernia is present with the hernia sac draped around the bypass graft. The rectum is unremarkable. Musculoskeletal: No lytic or blastic bone lesion. Remote appearing compression fracture of T11 is noted with approximately 20-30% loss of height. An age indeterminate compression  fracture of T10 is identified demonstrating 30-40% loss of height. There is no retropulsion associated with either of these fractures. Advanced degenerative changes are noted within the lumbar spine. IMPRESSION: Mid small bowel obstruction secondary to a herniated loop of small bowel within a complex supraumbilical ventral hernia. Complex ventral hernia contains at least 3 separate hernia sacs containing mesenteric fat as well as the point of transition within the mid small bowel. Peripheral vascular disease. Femoral femoral bypass grafting noted. Stenosis of the distal left external iliac artery is suspected, though not well assessed on this noncontrast examination. Remote T11 compression fracture. Age  indeterminate T10 compression fracture with 30-40% loss of height. Correlation for point tenderness may be helpful in helping determine acuity. If indicated, this could be better assessed with MRI examination. No associated retropulsion with either of these fractures. Aortic Atherosclerosis (ICD10-I70.0). Electronically Signed   By: Fidela Salisbury MD   On: 12/27/2020 22:28     Assessment/Plan Principal Problem:   SBO (small bowel obstruction) (HCC) Active Problems:   Hyperlipidemia   Incarcerated ventral hernia    1. Small bowel obstruction with a loop of bowel [place] complicated supraumbilically ventral hernia for which general surgery has been consulted.  Dr. Georgette Dover of general surgery was notified.  We will keep patient n.p.o. IV fluids and pain medication.  Further recommendation per general surgery. 2. History of hyperlipidemia on statins. 3. Age-indeterminate compression fractures seen in CAT scan.  Since patient has bowel obstruction will need close monitoring for any further worsening in inpatient status.   DVT prophylaxis: SCDs.  Avoiding anticoagulation in anticipation of possible procedure. Code Status: Full code. Family Communication: Discussed with patient. Disposition Plan: Home. Consults called: General surgery. Admission status: Inpatient.   Rise Patience MD [facility] Pager [phone].  If 7PM-7AM, please contact night-coverage [email or web address] Password [email or web address]  12/28/2020, 3:09 AM

## 2020-12-28 NOTE — Transfer of Care (Signed)
Immediate Anesthesia Transfer of Care Note  Patient: [name]  Procedure(s) Performed: LAPAROSCOPIC VENTERAL HERNIA REPAIR WITH MESH, LYSIS OF ADHESIONS (N/A )  Patient Location: PACU  Anesthesia Type:General  Level of Consciousness: awake and alert   Airway & Oxygen Therapy: Patient Spontanous Breathing and Patient connected to face mask oxygen  Post-op Assessment: Report given to RN and Post -op Vital signs reviewed and stable  Post vital signs: Reviewed and stable  Last Vitals:  Vitals Value Taken Time  BP 147/79 12/28/20 1630  Temp    Pulse 88 12/28/20 1632  Resp 6 12/28/20 1632  SpO2 100 % 12/28/20 1632  Vitals shown include unvalidated device data.  Last Pain:  Vitals:   12/28/20 1134  TempSrc:   PainSc: 0-No pain         Complications: No complications documented.

## 2020-12-28 NOTE — ED Notes (Signed)
Report called to [facility], RN for Rm 1310 at [facility], pt resting comfortably, waiting [facility] transfer

## 2020-12-28 NOTE — ED Notes (Signed)
Report given to [facility], ETA 15 min

## 2020-12-28 NOTE — Anesthesia Postprocedure Evaluation (Signed)
Anesthesia Post Note  Patient: [name]  Procedure(s) Performed: LAPAROSCOPIC VENTERAL HERNIA REPAIR WITH MESH, LYSIS OF ADHESIONS (N/A )     Anesthesia Post Evaluation No complications documented.  Last Vitals:  Vitals:   12/28/20 1012 12/28/20 1130  BP: 114/62 125/76  Pulse: (!) 59 64  Resp: 18 16  Temp: (!) 36.4 C   SpO2: 95% 96%    Last Pain:  Vitals:   12/28/20 1134  TempSrc:   PainSc: 0-No pain                 Demetrias Goodbar

## 2020-12-28 NOTE — Consult Note (Signed)
Consult Note  [name] 10-25-1945  QU:[id number].    Requesting MD: Dr. Jacki Cones Chief Complaint/Reason for Consult: hernia with SBO HPI:  Patient is a 75 year old male who presented to [facility] yesterday with 1 day of abdominal pain, nausea and vomiting and non-reducible known ventral hernia. He was recently seen by surgical service in March for the same but resolved without surgical intervention and planned to follow up as an outpatient to discuss elective repair. Patient reports last BM was Monday and it was non-bloody and not melanotic. He was previously not passing flatus but started to overnight. He now reports resolution in abdominal pain, nausea and vomiting but does not feel like hernia has reduced. He denies fever, chills, chest pain, SOB, urinary symptoms. Past abdominal surgery includes prior umbilical hernia repair with mesh, small bowel resection from complication of mesh and removal of mesh from abdominal wall. PMH significant for Interstitial lung disease/Hx tobacco use, GERD, Depression, Hyperlipidemia, PVD with prior right to left femorofemoral bypass graft 01/13/2019, Left radical orchiectomy 01/12/2019, CKD, Chronic constipation. He is not on any blood thinning medications. NKDA.   ROS: Review of Systems  Constitutional: Negative for chills and fever.  Respiratory: Negative for shortness of breath and wheezing.   Cardiovascular: Negative for chest pain and palpitations.  Gastrointestinal: Positive for abdominal pain, constipation, nausea and vomiting. Negative for blood in stool, diarrhea and melena.  Genitourinary: Negative for dysuria, frequency and urgency.  All other systems reviewed and are negative.   Family History  Problem Relation Age of Onset  . Other Neg Hx        No FHx of small bowel obstruction    Past Medical History:  Diagnosis Date  . Basal cell carcinoma 02/21/2011   left inner cheek, lateral (CX35FU)  . Depression   . GERD  (gastroesophageal reflux disease)   . Hyperlipidemia   . Osteopenia   . Squamous cell carcinoma of skin 07/21/2019   in situ- fornt center scalp (CX35FU)    Past Surgical History:  Procedure Laterality Date  . ABDOMINAL WALL MESH  REMOVAL  05/29/2008  . FEMORAL-FEMORAL BYPASS GRAFT Left 01/13/2019   Procedure: BYPASS GRAFT FEMORAL-FEMORAL ARTERY;  Surgeon: Angelia Mould, MD;  Location: [facility];  Service: Vascular;  Laterality: Left;  . LUMBAR LAMINECTOMY    . ORCHIECTOMY Left 01/12/2019   Procedure: LEFT INGUINAL RADICAL ORCHIECTOMY;  Surgeon: Lucas Mallow, MD;  Location: [facility];  Service: Urology;  Laterality: Left;  . ROTATOR CUFF REPAIR Right   . SMALL INTESTINE SURGERY  05/29/2008   SBO into mesh - SB resection, mesh removal & primary hernia repair  . UMBILICAL HERNIA REPAIR  2009   open with Ventralex mesh patch    Social History:  reports that he has quit smoking. His smoking use included cigarettes. He has a 28.00 pack-year smoking history. He has never used smokeless tobacco. He reports current alcohol use of about 6.0 standard drinks of alcohol per week. He reports that he does not use drugs.  Allergies: No Known Allergies  Medications Prior to Admission  Medication Sig Dispense Refill  . acetaminophen (TYLENOL) 500 MG tablet Take 500 mg by mouth every 6 (six) hours as needed for headache (pain).    Marland Kitchen aspirin EC 81 MG tablet Take 81 mg by mouth daily. Swallow whole.    . cholecalciferol (VITAMIN D) 25 MCG (1000 UT) tablet Take 1,000 Units by mouth at bedtime.     Marland Kitchen  omeprazole (PRILOSEC) 10 MG capsule Take 10 mg by mouth every morning.    . rosuvastatin (CRESTOR) 10 MG tablet Take 10 mg by mouth daily.    . sertraline (ZOLOFT) 50 MG tablet Take 50 mg by mouth daily as needed (anxiety).    . COVID-19 mRNA vaccine, Pfizer, 30 MCG/0.3ML injection INJECT AS DIRECTED .3 mL 0    Blood pressure 114/71, pulse (!) 57, temperature 97.7 F (36.5 C),  temperature source Oral, resp. rate 14, height 6' (1.829 m), weight 99.8 kg, SpO2 98 %. Physical Exam:  General: pleasant, WD, overweight male who is laying in bed in NAD HEENT: head is normocephalic, atraumatic.  Sclera are noninjected.  PERRL.  Ears and nose without any masses or lesions.  Mouth is pink and moist Heart: regular, rate, and rhythm.  Normal s1,s2. No obvious murmurs, gallops, or rubs noted.  Palpable radial and pedal pulses bilaterally Lungs: CTAB, no wheezes, rhonchi, or rales noted.  Respiratory effort nonlabored Abd: soft, NT, ND, +BS, partially reducible ventral hernia, midline surgical scar MS: all 4 extremities are symmetrical with no cyanosis, clubbing, or edema. Skin: warm and dry with no masses, lesions, or rashes Neuro: Cranial nerves 2-12 grossly intact, sensation is normal throughout Psych: A&Ox3 with an appropriate affect.   Results for orders placed or performed during the hospital encounter of 12/27/20 (from the past 48 hour(s))  CBC with Differential/Platelet     Status: None   Collection Time: 12/27/20  9:35 PM  Result Value Ref Range   WBC 7.0 4.0 - 10.5 K/uL   RBC 4.59 4.22 - 5.81 MIL/uL   Hemoglobin 14.7 13.0 - 17.0 g/dL   HCT 43.2 39.0 - 52.0 %   MCV 94.1 80.0 - 100.0 fL   MCH 32.0 26.0 - 34.0 pg   MCHC 34.0 30.0 - 36.0 g/dL   RDW 13.1 11.5 - 15.5 %   Platelets 203 150 - 400 K/uL   nRBC 0.0 0.0 - 0.2 %   Neutrophils Relative % 62 %   Neutro Abs 4.3 1.7 - 7.7 K/uL   Lymphocytes Relative 20 %   Lymphs Abs 1.4 0.7 - 4.0 K/uL   Monocytes Relative 15 %   Monocytes Absolute 1.0 0.1 - 1.0 K/uL   Eosinophils Relative 3 %   Eosinophils Absolute 0.2 0.0 - 0.5 K/uL   Basophils Relative 0 %   Basophils Absolute 0.0 0.0 - 0.1 K/uL   Immature Granulocytes 0 %   Abs Immature Granulocytes 0.02 0.00 - 0.07 K/uL    Comment: Performed at [facility], [address] [place], [place] [address]  Comprehensive metabolic panel     Status: Abnormal    Collection Time: 12/27/20  9:35 PM  Result Value Ref Range   Sodium 134 (L) 135 - 145 mmol/L   Potassium 4.2 3.5 - 5.1 mmol/L   Chloride 101 98 - 111 mmol/L   CO2 24 22 - 32 mmol/L   Glucose, Bld 136 (H) 70 - 99 mg/dL    Comment: Glucose reference range applies only to samples taken after fasting for at least 8 hours.   BUN 17 8 - 23 mg/dL   Creatinine, Ser 1.04 0.61 - 1.24 mg/dL   Calcium 9.3 8.9 - 10.3 mg/dL   Total Protein 7.5 6.5 - 8.1 g/dL   Albumin 3.8 3.5 - 5.0 g/dL   AST 26 15 - 41 U/L   ALT 17 0 - 44 U/L   Alkaline Phosphatase 90 38 -  126 U/L   Total Bilirubin 0.9 0.3 - 1.2 mg/dL   GFR, Estimated >60 >60 mL/min    Comment: (NOTE) Calculated using the CKD-EPI Creatinine Equation (2021)    Anion gap 9 5 - 15    Comment: Performed at [facility], [address] [place], [place] [address]  Lipase, blood     Status: None   Collection Time: 12/27/20  9:35 PM  Result Value Ref Range   Lipase 26 11 - 51 U/L    Comment: Performed at [facility], [address] [place], [place] [address]  Resp Panel by RT-PCR (Flu A&B, Covid) Nasopharyngeal Swab     Status: None   Collection Time: 12/27/20 10:59 PM   Specimen: Nasopharyngeal Swab; Nasopharyngeal(NP) swabs in vial transport medium  Result Value Ref Range   SARS Coronavirus 2 by RT PCR NEGATIVE NEGATIVE    Comment: (NOTE) SARS-CoV-2 target nucleic acids are NOT DETECTED.  The SARS-CoV-2 RNA is generally detectable in upper respiratory specimens during the acute phase of infection. The lowest concentration of SARS-CoV-2 viral copies this assay can detect is 138 copies/mL. A negative result does not preclude SARS-Cov-2 infection and should not be used as the sole basis for treatment or other patient management decisions. A negative result may occur with  improper specimen collection/handling, submission of specimen other than nasopharyngeal swab, presence of viral mutation(s) within the areas targeted  by this assay, and inadequate number of viral copies(<138 copies/mL). A negative result must be combined with clinical observations, patient history, and epidemiological information. The expected result is Negative.  Fact Sheet for Patients:  EntrepreneurPulse.com.au  Fact Sheet for Healthcare Providers:  IncredibleEmployment.be  This test is no t yet approved or cleared by the [place] FDA and  has been authorized for detection and/or diagnosis of SARS-CoV-2 by FDA under an Emergency Use Authorization (EUA). This EUA will remain  in effect (meaning this test can be used) for the duration of the COVID-19 declaration under Section 564(b)(1) of the Act, 21 U.S.C.section 360bbb-3(b)(1), unless the authorization is terminated  or revoked sooner.       Influenza A by PCR NEGATIVE NEGATIVE   Influenza B by PCR NEGATIVE NEGATIVE    Comment: (NOTE) The Xpert Xpress SARS-CoV-2/FLU/RSV plus assay is intended as an aid in the diagnosis of influenza from Nasopharyngeal swab specimens and should not be used as a sole basis for treatment. Nasal washings and aspirates are unacceptable for Xpert Xpress SARS-CoV-2/FLU/RSV testing.  Fact Sheet for Patients: EntrepreneurPulse.com.au  Fact Sheet for Healthcare Providers: IncredibleEmployment.be  This test is not yet approved or cleared by the [place] FDA and has been authorized for detection and/or diagnosis of SARS-CoV-2 by FDA under an Emergency Use Authorization (EUA). This EUA will remain in effect (meaning this test can be used) for the duration of the COVID-19 declaration under Section 564(b)(1) of the Act, 21 U.S.C. section 360bbb-3(b)(1), unless the authorization is terminated or revoked.  Performed at [facility], [address] [place], [place] [address]   Comprehensive metabolic panel     Status: Abnormal   Collection Time: 12/28/20   4:00 AM  Result Value Ref Range   Sodium 137 135 - 145 mmol/L   Potassium 4.6 3.5 - 5.1 mmol/L   Chloride 103 98 - 111 mmol/L   CO2 28 22 - 32 mmol/L   Glucose, Bld 120 (H) 70 - 99 mg/dL    Comment: Glucose reference range  applies only to samples taken after fasting for at least 8 hours.   BUN 18 8 - 23 mg/dL   Creatinine, Ser 1.03 0.61 - 1.24 mg/dL   Calcium 9.1 8.9 - 10.3 mg/dL   Total Protein 6.4 (L) 6.5 - 8.1 g/dL   Albumin 3.5 3.5 - 5.0 g/dL   AST 22 15 - 41 U/L   ALT 17 0 - 44 U/L   Alkaline Phosphatase 74 38 - 126 U/L   Total Bilirubin 0.7 0.3 - 1.2 mg/dL   GFR, Estimated >60 >60 mL/min    Comment: (NOTE) Calculated using the CKD-EPI Creatinine Equation (2021)    Anion gap 6 5 - 15    Comment: Performed at [facility], [place] [address], [place], [place] [address]  CBC WITH DIFFERENTIAL     Status: Abnormal   Collection Time: 12/28/20  4:00 AM  Result Value Ref Range   WBC 6.5 4.0 - 10.5 K/uL   RBC 4.10 (L) 4.22 - 5.81 MIL/uL   Hemoglobin 13.1 13.0 - 17.0 g/dL   HCT 40.0 39.0 - 52.0 %   MCV 97.6 80.0 - 100.0 fL   MCH 32.0 26.0 - 34.0 pg   MCHC 32.8 30.0 - 36.0 g/dL   RDW 13.1 11.5 - 15.5 %   Platelets 187 150 - 400 K/uL   nRBC 0.0 0.0 - 0.2 %   Neutrophils Relative % 49 %   Neutro Abs 3.2 1.7 - 7.7 K/uL   Lymphocytes Relative 33 %   Lymphs Abs 2.1 0.7 - 4.0 K/uL   Monocytes Relative 15 %   Monocytes Absolute 1.0 0.1 - 1.0 K/uL   Eosinophils Relative 3 %   Eosinophils Absolute 0.2 0.0 - 0.5 K/uL   Basophils Relative 0 %   Basophils Absolute 0.0 0.0 - 0.1 K/uL   Immature Granulocytes 0 %   Abs Immature Granulocytes 0.01 0.00 - 0.07 K/uL    Comment: Performed at [facility], [place] [address], [place], [place] [address]   CT ABDOMEN PELVIS WO CONTRAST  Result Date: 12/27/2020 CLINICAL DATA:  Acute, nonlocalized abdominal pain and vomiting. EXAM: CT ABDOMEN AND PELVIS WITHOUT CONTRAST TECHNIQUE: Multidetector CT imaging of the  abdomen and pelvis was performed following the standard protocol without IV contrast. COMPARISON:  10/26/2020 FINDINGS: Lower chest: Mild bibasilar pulmonary fibrotic change. The visualized heart and pericardium are unremarkable. Hepatobiliary: No focal liver abnormality is seen. No gallstones, gallbladder wall thickening, or biliary dilatation. Pancreas: Unremarkable Spleen: Unremarkable Adrenals/Urinary Tract: Adrenal glands are unremarkable. Kidneys are normal, without renal calculi, focal lesion, or hydronephrosis. Bladder is unremarkable. Stomach/Bowel: A mid small bowel obstruction is present with the transition point representing a single loop of a small-bowel within a complex supraumbilical ventral hernia. This is best seen on axial image # 43 and sagittal image # 58. The proximal small bowel is mildly distended with gas and fluid. The distal small bowel is decompressed. Partial small bowel resection has been performed. There is no free intraperitoneal gas or fluid. The stomach, distal small bowel, and colon are unremarkable. The appendix is unremarkable. Vascular/Lymphatic: Moderate aortoiliac atherosclerotic calcification is present. The distal left external iliac artery appears stenotic, though this is not well assessed on this noncontrast examination. Femoral-femoral bypass grafting has been performed. No pathologic adenopathy within the abdomen and pelvis. Reproductive: Prostate is unremarkable. Other: Complex supraumbilical ventral hernia is present containing at least 3 separate hernia sacs containing mesenteric fat as well as the single loop of  small bowel resulting in the small-bowel obstruction described above. Additionally, a a moderate fat containing left inguinal hernia is present with the hernia sac draped around the bypass graft. The rectum is unremarkable. Musculoskeletal: No lytic or blastic bone lesion. Remote appearing compression fracture of T11 is noted with approximately 20-30% loss of  height. An age indeterminate compression fracture of T10 is identified demonstrating 30-40% loss of height. There is no retropulsion associated with either of these fractures. Advanced degenerative changes are noted within the lumbar spine. IMPRESSION: Mid small bowel obstruction secondary to a herniated loop of small bowel within a complex supraumbilical ventral hernia. Complex ventral hernia contains at least 3 separate hernia sacs containing mesenteric fat as well as the point of transition within the mid small bowel. Peripheral vascular disease. Femoral femoral bypass grafting noted. Stenosis of the distal left external iliac artery is suspected, though not well assessed on this noncontrast examination. Remote T11 compression fracture. Age indeterminate T10 compression fracture with 30-40% loss of height. Correlation for point tenderness may be helpful in helping determine acuity. If indicated, this could be better assessed with MRI examination. No associated retropulsion with either of these fractures. Aortic Atherosclerosis (ICD10-I70.0). Electronically Signed   By: Fidela Salisbury MD   On: 12/27/2020 22:28      Assessment/Plan Interstitial lung disease/Hx tobacco use GERD Depression Hyperlipidemia PVD with prior right to left femorofemoral bypass graft 01/13/2019 Left radical orchiectomy 01/12/2019 CKD - Cr 1.03 today  Chronic constipation  Complex ventral hernia with SBO - CT yesterday with Mid small bowel obstruction secondary to a herniated loop of small bowel within a complex supraumbilical ventral hernia. Complex ventral hernia contains at least 3 separate hernia sacs containing mesenteric fat as well as the point of transition within the mid small bowel. - patient currently reports he is now passing flatus and abdominal pain has resolved - however, hernia remains partially not reducible  - this is the second episode of SBO secondary to hernia in 2 months - recommend surgical reduction and  repair to prevent further episodes and risk of bowel compromise  FEN: NPO, IVF VTE: SCDs ID: [id number] 5/18 pre-op  Norm Parcel, [facility] Surgery 12/28/2020, 8:30 AM Please see Amion for pager number during day hours 7:00am-4:30pm

## 2020-12-28 NOTE — Anesthesia Procedure Notes (Signed)
Procedure Name: Intubation Date/Time: 12/28/2020 1:45 PM Performed by: Eben Burow, CRNA Pre-anesthesia Checklist: Patient identified, Emergency Drugs available, Suction available, Patient being monitored and Timeout performed Patient Re-evaluated:Patient Re-evaluated prior to induction Oxygen Delivery Method: Circle system utilized Preoxygenation: Pre-oxygenation with 100% oxygen Induction Type: IV induction, Rapid sequence and Cricoid Pressure applied Laryngoscope Size: Mac and 4 Grade View: Grade I Tube type: Oral Tube size: 7.5 mm Number of attempts: 1 Airway Equipment and Method: Stylet Placement Confirmation: ETT inserted through vocal cords under direct vision,  positive ETCO2 and breath sounds checked- equal and bilateral Secured at: 22 cm Tube secured with: Tape Dental Injury: Teeth and Oropharynx as per pre-operative assessment

## 2020-12-28 NOTE — Op Note (Signed)
12/28/2020  PATIENT:  [name]  75 y.o. male  Patient Care Team: Deland Pretty, MD as PCP - General (Internal Medicine) Lavonna Monarch, MD as Consulting Physician (Dermatology) Michael Boston, MD as Consulting Physician (General Surgery)  PRE-OPERATIVE DIAGNOSIS:  Incarcerateed incisional hernia with Small Bowel Obstruction  POST-OPERATIVE DIAGNOSIS:   RECURRENT INCARCERATED INCISIONAL HERNIA SMALL BOWEL OBSTRUCTION  PROCEDURE:   LAPAROSCOPIC REPAIR OF  INCISIONAL RECURRENT INCARCERATED ABDOMINAL WALL HERNIA WITH MESH  LAPAROSCOPIC LYSIS OF ADHESIONS  TRANSVERSUS ABDOMINIS PLANE (TAP) BLOCK - BILATERAL  SURGEON:  Adin Hector, MD  ASSISTANT: Nadeen Landau, MD    ANESTHESIA:     General  Nerve block provided with liposomal bupivacaine (Experel) mixed with 0.25% bupivacaine as a Bilateral TAP block x 29mL each side at the level of the transverse abdominis & preperitoneal spaces along the flank at the anterior axillary line, from subcostal ridge to iliac crest under laparoscopic guidance   EBL:  Total I/O In: 1700 [I.V.:1700] Out: 950 [Urine:900; Blood:50]  Per anesthesia record  Delay start of Pharmacological VTE agent (>24hrs) due to surgical blood loss or risk of bleeding:  no  DRAINS: none   SPECIMEN:  No Specimen  DISPOSITION OF SPECIMEN:  N/A  COUNTS:  YES  PLAN OF CARE: Admit to inpatient   PATIENT DISPOSITION:  PACU - hemodynamically stable.  INDICATION: Patient with prior history of periumbilical hernia requiring open repair with mesh complicated by adhesions bowel obstruction requiring small bowel resection and mesh removal 2009.  Developed recurrent hernia.  Seen in March with reducible hernia recommendation made to consider surgical repair.  Came in with evidence of bowel obstruction and Swiss cheese type hernias containing omentum as well as a knuckle of fat as it with a transition point causing the obstruction.  Given the incarcerated bowel  with obstructive symptoms and possible strangulation, recommendation made for laparoscopic possible open urgent exploration with repair.  Possible bowel resection.    The anatomy & physiology of the abdominal wall was discussed. The pathophysiology of hernias was discussed. Natural history risks without surgery including progeressive enlargement, pain, incarceration & strangulation was discussed. Contributors to complications such as smoking, obesity, diabetes, prior surgery, etc were discussed.  I feel the risks of no intervention will lead to serious problems that outweigh the operative risks; therefore, I recommended surgery to reduce and repair the hernia. I explained laparoscopic techniques with possible need for an open approach. I noted the probable use of mesh to patch and/or buttress the hernia repair.  Risks such as bleeding, infection, abscess, need for further treatment, heart attack, death, and other risks were discussed. I noted a good likelihood this will help address the problem. Goals of post-operative recovery were discussed as well. Possibility that this will not correct all symptoms was explained. I stressed the importance of low-impact activity, aggressive pain control, avoiding constipation, & not pushing through pain to minimize risk of post-operative chronic pain or injury. Possibility of reherniation especially with smoking, obesity, diabetes, immunosuppression, and other health conditions was discussed. We will work to minimize complications.   An educational handout further explaining the pathology & treatment options was given as well. Questions were answered. The patient expresses understanding & wishes to proceed with surgery.   OR FINDINGS: Very dense adhesions of omentum and small bowel to anterior abdominal wall involving supraumbilical Swiss cheese type hernias.  15 x 6 cm region.  Small bowel involved but ultimately reducible.  Omentum quite incarcerated but not necrotic.   No  evidence of any ischemia perforation or necrosis.  Therefore more aggressive repair done since patient had recurrence of primary repair.   Type of repair: Laparoscopic underlay repair.  Primary repair of largest hernia   Placement of mesh: Centrally intraperitoneal with edges tucked into RECTRORECTUS & preperitoneal space  Name of mesh: Bard Ventralight dual sided (polypropylene / Seprafilm)  Size of mesh: 25x20cm  Orientation: Vertical  Mesh overlap:  5-7cm   DESCRIPTION:   Informed consent was confirmed. The patient underwent general anaesthesia without difficulty. The patient was positioned appropriately. VTE prevention in place. The patient's abdomen was clipped, prepped, & draped in a sterile fashion. Surgical timeout confirmed our plan.  The patient was positioned in reverse Trendelenburg. Abdominal entry was gained using optical entry technique in the left upper abdomen. Entry was clean. I induced carbon dioxide insufflation. Camera inspection revealed no injury. Extra ports were carefully placed under direct laparoscopic visualization.   I could see adhesions on the parietal peritoneum under the abdominal wall.  I did laparoscopic lysis of adhesions to expose the entire anterior abdominal wall.  I primarily used focused sharp dissection.  This took 90 minutes, half of the case.  There were very dense adhesions.  Eventually reduced out pockets of greater omentum out of incarcerated hernias.  Found focus of small bowel densely adherent.  Actually went into the preperitoneal space to avoid injury to the bowel and eventually freed all the bowel off.  No evidence of enterotomy.  We then freed off interloop adhesions of small bowel.  Found the prior small bowel anastomosis which seemed to be the epicenter of adhesions.  We did meticulous inspection and proved no evidence of bowel injury or ischemia or perforation.  Based on the fact that he had recurrence with emergent primary care I decided  to proceed with mesh repair laparoscopically with a larger sheet.  Dr. Dema Severin agreed.    I freed off the falciform ligament and central peritoneum to expose the retrorectus fascia freed off the preperitoneal space infraumbilically as well.  I did lyse adhesions to try and help free up the omentum to get it to reach down the abdomen but he did not have much good omentum left.  I ran the small bowel from the ileocecal valve proximally to the ligament of Treitz to prove no turning or twisting or bowel injury.  Freed off interloop adhesions and confirmed the small bowel anastomosis is open and patent.  Looks like the transition point was at the mid jejunum for mid dense adhesions there and not at the small bowel anastomosis which was open and patent.  I made sure hemostasis was good.  I mapped out the region using a needle passer.   To ensure that I would have at least 5 cm radial coverage outside of the hernia defect, I chose a 25x20cm dual sided mesh.  I placed #1 Prolene stitches around its edge about every 5 cm = 12 total.  I rolled the mesh & placed into the peritoneal cavity through the largest hernia defect.  I unrolled the mesh and positioned it appropriately.  I secured the mesh to cover up the hernia defect using a laparoscopic suture passer to pass the tails of the Prolene through the abdominal wall & tagged them with clamps for good transfascial suturing.  I started out in four corners to make sure I had the mesh centered under the hernia defect appropriately, and then proceeded to work in quadrants.    We evacuated CO2 &  desufflated the abdomen.  I tied the fascial stitches down. I closed the fascial defect that I placed the mesh through using #1 PDS interrupted transverse stitches primarily.  I reinsufflated the abdomen. The mesh provided at least circumferential coverage around the entire region of hernia defects.  I secured the mesh centrally with an additional trans fascial stitch in & out the mesh  using #1 PDS under laparoscopic visualization.   I tacked the edges & central part of the mesh to the peritoneum/posterior rectus fascia with SecureStrap absorbable tacks.   I did reinspection. Hemostasis was good. Mesh laid well. I completed a broad field block of local anesthesia at fascial stitch sites & fascial closure areas.    Capnoperitoneum was evacuated. Ports were removed. The skin was closed with Monocryl at the port sites and Steri-Strips on the fascial stitch puncture sites.  Patient is being extubated to go to the recovery room.  I discussed operative findings, updated the patient's status, discussed probable steps to recovery, and gave postoperative recommendations to the patient's spouse, Lydell Moga.  Recommendations were made.  Questions were answered.  She expressed understanding & appreciation.  Adin Hector, M.D., F.A.C.S. Gastrointestinal and Minimally Invasive Surgery [facility] [address] [place], [place] [address] [phone] Main / Paging  12/28/2020 4:26 PM

## 2020-12-28 NOTE — Discharge Instructions (Signed)
HERNIA REPAIR: POST OP INSTRUCTIONS ° °###################################################################### ° °EAT °Gradually transition to a high fiber diet with a fiber supplement over the next few weeks after discharge.  Start with a pureed / full liquid diet (see below) ° °WALK °Walk an hour a day.  Control your pain to do that.   ° °CONTROL PAIN °Control pain so that you can walk, sleep, tolerate sneezing/coughing, and go up/down stairs. ° °HAVE A BOWEL MOVEMENT DAILY °Keep your bowels regular to avoid problems.  OK to try a laxative to override constipation.  OK to use an antidairrheal to slow down diarrhea.  Call if not better after 2 tries ° °CALL IF YOU HAVE PROBLEMS/CONCERNS °Call if you are still struggling despite following these instructions. °Call if you have concerns not answered by these instructions ° °###################################################################### ° ° ° °1. DIET: Follow a light bland diet & liquids the first 24 hours after arrival home, such as soup, liquids, starches, etc.  Be sure to drink plenty of fluids.  Quickly advance to a usual solid diet within a few days.  Avoid fast food or heavy meals as your are more likely to get nauseated or have irregular bowels.  A low-fat, high-fiber diet for the rest of your life is ideal.  ° °2. Take your usually prescribed home medications unless otherwise directed. ° °3. PAIN CONTROL: °a. Pain is best controlled by a usual combination of three different methods TOGETHER: °i. Ice/Heat °ii. Over the counter pain medication °iii. Prescription pain medication °b. Most patients will experience some swelling and bruising around the hernia(s) such as the bellybutton, groins, or old incisions.  Ice packs or heating pads (30-60 minutes up to 6 times a day) will help. Use ice for the first few days to help decrease swelling and bruising, then switch to heat to help relax tight/sore spots and speed recovery.  Some people prefer to use ice  alone, heat alone, alternating between ice & heat.  Experiment to what works for you.  Swelling and bruising can take several weeks to resolve.   °c. It is helpful to take an over-the-counter pain medication regularly for the first few weeks.  Choose one of the following that works best for you: °i. Naproxen (Aleve, etc)  Two 220mg tabs twice a day °ii. Ibuprofen (Advil, etc) Three 200mg tabs four times a day (every meal & bedtime) °iii. Acetaminophen (Tylenol, etc) 325-650mg four times a day (every meal & bedtime) °d. A  prescription for pain medication should be given to you upon discharge.  Take your pain medication as prescribed.  °i. If you are having problems/concerns with the prescription medicine (does not control pain, nausea, vomiting, rash, itching, etc), please call us [phone] to see if we need to switch you to a different pain medicine that will work better for you and/or control your side effect better. °ii. If you need a refill on your pain medication, please contact your pharmacy.  They will contact our office to request authorization. Prescriptions will not be filled after 5 pm or on week-ends. ° °4. Avoid getting constipated.  Between the surgery and the pain medications, it is common to experience some constipation.  Increasing fluid intake and taking a fiber supplement (such as Metamucil, Citrucel, FiberCon, MiraLax, etc) 1-2 times a day regularly will usually help prevent this problem from occurring.  A mild laxative (prune juice, Milk of Magnesia, MiraLax, etc) should be taken according to package directions if there are no bowel movements after 48   hours.   ° °5. Wash / shower every day.  You may shower over the dressings as they are waterproof.   ° °6. Remove your waterproof bandages, skin tapes, and other bandages 3 days after surgery. You may replace a dressing/Band-Aid to cover the incision for comfort if you wish. You may leave the incisions open to air.  You may replace a  dressing/Band-Aid to cover an incision for comfort if you wish.  Continue to shower over incision(s) after the dressing is off. ° °7. ACTIVITIES as tolerated:   °a. You may resume regular (light) daily activities beginning the next day--such as daily self-care, walking, climbing stairs--gradually increasing activities as tolerated.  Control your pain so that you can walk an hour a day.  If you can walk 30 minutes without difficulty, it is safe to try more intense activity such as jogging, treadmill, bicycling, low-impact aerobics, swimming, etc. °b. Save the most intensive and strenuous activity for last such as sit-ups, heavy lifting, contact sports, etc  Refrain from any heavy lifting or straining until you are off narcotics for pain control.   °c. DO NOT PUSH THROUGH PAIN.  Let pain be your guide: If it hurts to do something, don't do it.  Pain is your body warning you to avoid that activity for another week until the pain goes down. °d. You may drive when you are no longer taking prescription pain medication, you can comfortably wear a seatbelt, and you can safely maneuver your car and apply brakes. °e. You may have sexual intercourse when it is comfortable.  ° °8. FOLLOW UP in our office °a. Please call [facility] at [phone] to set up an appointment to see your surgeon in the office for a follow-up appointment approximately 2-3 weeks after your surgery. °b. Make sure that you call for this appointment the day you arrive home to insure a convenient appointment time. ° °9.  If you have disability of FMLA / Family leave forms, please bring the forms to the office for processing.  (do not give to your surgeon). ° °WHEN TO CALL US [phone]: °1. Poor pain control °2. Reactions / problems with new medications (rash/itching, nausea, etc)  °3. Fever over 101.5 F (38.5 C) °4. Inability to urinate °5. Nausea and/or vomiting °6. Worsening swelling or bruising °7. Continued bleeding from incision. °8. Increased pain,  redness, or drainage from the incision ° ° The clinic staff is available to answer your questions during regular business hours (8:30am-5pm).  Please don’t hesitate to call and ask to speak to one of our nurses for clinical concerns.  ° If you have a medical emergency, go to the nearest emergency room or call 911. ° A surgeon from [facility] is always on call at the hospitals in [place] ° °[facility] °[address], [address], [place], [place]  [address] ? ° [address] [address], [place],    [address] °MAIN: [phone] ? TOLL FREE: [phone] ? FAX: [phone] °[email or web address] ° °

## 2020-12-28 NOTE — Progress Notes (Signed)
Patient admitted this morning by my partner. 75 year old male with history of recurrent small bowel obstruction, hyperlipidemia and GERD admitted with abdominal pain nausea and vomiting.  CT abdomen and pelvis shows small bowel obstruction with a loop of bowel inside the complicated ventral hernia.  Seen by surgery and recommending surgical reduction and repair. Patient reports he is better with less pain and having flatus.  Labs from today reviewed.

## 2020-12-28 NOTE — Anesthesia Preprocedure Evaluation (Signed)
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 Left ventricular ejection fraction, by estimation, is 60 to 65%. The left ventricle has normal function. The left ventricle has no regional wall motion abnormalities. Left ventricular diastolic parameters are consistent with Grade I diastolic dysfunction (impaired relaxation).  2. Right ventricular systolic function is normal. The right ventricular size is normal. There is normal pulmonary artery systolic pressure.  3. The mitral valve is normal in structure. Mild mitral valve  regurgitation. No evidence of mitral stenosis.  4. The aortic valve is tricuspid. Aortic valve regurgitation is not  visualized. Mild aortic valve sclerosis is present, with no evidence of aortic valve stenosis.  5. Aortic dilatation noted. There is mild dilatation of the aortic root measuring 39 mm.  6. The inferior vena cava is normal in size with greater than 50% respiratory variability, suggesting right atrial pressure of 3 mmHg.    Neuro/Psych PSYCHIATRIC DISORDERS Anxiety Depression negative neurological ROS     GI/Hepatic Neg liver ROS, GERD  Medicated and Controlled,Ventral incisional incarcerated hernia with small bowel obstruciton   Endo/Other  Hyperlipidemia  Renal/GU negative Renal ROS   negative genitourinary   Musculoskeletal  (+) Arthritis , Osteoarthritis,    Abdominal   Peds  Hematology negative hematology ROS (+)   Anesthesia Other Findings   Reproductive/Obstetrics                             Anesthesia Physical Anesthesia Plan  ASA: III  Anesthesia Plan: General   Post-op Pain Management:    Induction: Intravenous  PONV Risk Score and Plan: 4 or greater and Treatment may vary due to age or medical condition, Ondansetron and Dexamethasone  Airway Management Planned: Oral ETT  Additional Equipment:   Intra-op Plan:   Post-operative Plan: Extubation in OR  Informed Consent: I have reviewed the patients History and Physical, chart, labs and discussed the procedure including the risks, benefits and alternatives for the proposed anesthesia with the patient or authorized representative who has indicated his/her understanding and acceptance.     Dental advisory given  Plan Discussed with: CRNA and Anesthesiologist  Anesthesia Plan Comments:         Anesthesia Quick Evaluation

## 2020-12-29 ENCOUNTER — Encounter (HOSPITAL_COMMUNITY): Payer: Self-pay | Admitting: Surgery

## 2020-12-29 DIAGNOSIS — K430 Incisional hernia with obstruction, without gangrene: Secondary | ICD-10-CM | POA: Diagnosis not present

## 2020-12-29 LAB — CBC
HCT: 36.9 % — ABNORMAL LOW (ref 39.0–52.0)
Hemoglobin: 12.1 g/dL — ABNORMAL LOW (ref 13.0–17.0)
MCH: 31.8 pg (ref 26.0–34.0)
MCHC: 32.8 g/dL (ref 30.0–36.0)
MCV: 97.1 fL (ref 80.0–100.0)
Platelets: 176 10*3/uL (ref 150–400)
RBC: 3.8 MIL/uL — ABNORMAL LOW (ref 4.22–5.81)
RDW: 13.1 % (ref 11.5–15.5)
WBC: 7.5 10*3/uL (ref 4.0–10.5)
nRBC: 0 % (ref 0.0–0.2)

## 2020-12-29 LAB — BASIC METABOLIC PANEL
Anion gap: 7 (ref 5–15)
BUN: 13 mg/dL (ref 8–23)
CO2: 27 mmol/L (ref 22–32)
Calcium: 8.9 mg/dL (ref 8.9–10.3)
Chloride: 103 mmol/L (ref 98–111)
Creatinine, Ser: 1.06 mg/dL (ref 0.61–1.24)
GFR, Estimated: >60 mL/min (ref >60)
Glucose, Bld: 96 mg/dL (ref 70–99)
Potassium: 4 mmol/L (ref 3.5–5.1)
Sodium: 137 mmol/L (ref 135–145)

## 2020-12-29 MED ORDER — LORATADINE 10 MG PO TABS
10.0000 mg | ORAL_TABLET | Freq: Every day | ORAL | Status: DC
  Administered 2020-12-29 – 2021-01-04 (×7): 10 mg via ORAL
  Filled 2020-12-29 (×7): qty 1

## 2020-12-29 MED ORDER — GUAIFENESIN 100 MG/5ML PO SOLN
5.0000 mL | Freq: Four times a day (QID) | ORAL | Status: DC | PRN
  Administered 2020-12-29 – 2021-01-01 (×3): 100 mg via ORAL
  Filled 2020-12-29 (×4): qty 10

## 2020-12-29 NOTE — Evaluation (Signed)
Physical Therapy Evaluation Patient Details Name: [name] MRN: [id number] DOB: Oct 10, 1945 Today's Date: 12/29/2020   History of Present Illness  Pt s/p laparoscopic repair of incarcerated incisional hernia with SBO and lysis of adhesions.  Pt wtih hx of lumbar laminectomy, hernia repair (09), and femoral-femoral bypass graft in 2020  Clinical Impression  Pt admitted as above and presents with functional mobility limitations 2* post op pain and ambulatory balance deficits.  Pt very motivated and should progress to dc home with assist of family.    Follow Up Recommendations No PT follow up    Equipment Recommendations  None recommended by PT    Recommendations for Other Services       Precautions / Restrictions Precautions Precautions: Fall Restrictions Weight Bearing Restrictions: No      Mobility  Bed Mobility Overal bed mobility: Needs Assistance Bed Mobility: Rolling;Sidelying to Sit;Sit to Sidelying Rolling: Supervision Sidelying to sit: Min guard     Sit to sidelying: Min guard General bed mobility comments: use of bed rail and cues for proper technique    Transfers Overall transfer level: Needs assistance Equipment used: None Transfers: Sit to/from Stand Sit to Stand: Min guard;Supervision         General transfer comment: steady assist only  Ambulation/Gait Ambulation/Gait assistance: Min assist;Min guard Gait Distance (Feet): 500 Feet Assistive device: Rolling walker (2 wheeled);None Gait Pattern/deviations: Step-through pattern;Shuffle;Wide base of support     General Gait Details: 100' sans assistive device with noted decreased pace and general instability but no LOB.  Additional 400' with RW and noted improvement in stability and pt comment of feeling more stable/safe.  Stairs            Wheelchair Mobility    Modified Rankin (Stroke Patients Only)       Balance Overall balance assessment: Needs assistance Sitting-balance  support: No upper extremity supported;Feet supported Sitting balance-Leahy Scale: Good     Standing balance support: No upper extremity supported Standing balance-Leahy Scale: Good                               Pertinent Vitals/Pain Pain Assessment: 0-10 Pain Score: 4  Pain Location: abdomen Pain Descriptors / Indicators: Sore Pain Intervention(s): Limited activity within patient's tolerance;Monitored during session    Home Living Family/patient expects to be discharged to:: Private residence Living Arrangements: Spouse/significant other Available Help at Discharge: Family;Available PRN/intermittently Type of Home: House Home Access: Stairs to enter Entrance Stairs-Rails: None (I hold onto the door) Entrance Stairs-Number of Steps: 3 Home Layout: One level Home Equipment: Shower seat;Grab bars - tub/shower;Grab bars - toilet;Hand held shower head      Prior Function Level of Independence: Independent         Comments: Works part time driving dump truck     Journalist, newspaper        Extremity/Trunk Assessment   Upper Extremity Assessment Upper Extremity Assessment: Overall WFL for tasks assessed    Lower Extremity Assessment Lower Extremity Assessment: Overall WFL for tasks assessed       Communication   Communication: HOH  Cognition Arousal/Alertness: Awake/alert Behavior During Therapy: WFL for tasks assessed/performed Overall Cognitive Status: Within Functional Limits for tasks assessed                                        General  Comments      Exercises General Exercises - Lower Extremity Ankle Circles/Pumps: AROM;Both;15 reps;Supine   Assessment/Plan    PT Assessment Patient needs continued PT services  PT Problem List Decreased balance;Decreased mobility;Decreased knowledge of use of DME;Decreased knowledge of precautions;Pain       PT Treatment Interventions DME instruction;Gait training;Stair  training;Functional mobility training;Therapeutic activities;Therapeutic exercise;Balance training;Patient/family education    PT Goals (Current goals can be found in the Care Plan section)  Acute Rehab PT Goals Patient Stated Goal: HOME PT Goal Formulation: With patient Time For Goal Achievement: 01/12/21 Potential to Achieve Goals: Good    Frequency Min 3X/week   Barriers to discharge Decreased caregiver support Spouse unable to assist 2* to stage IVCA; dtr lives a mile away and can assist    Co-evaluation               AM-PAC PT "6 Clicks" Mobility  Outcome Measure Help needed turning from your back to your side while in a flat bed without using bedrails?: A Little Help needed moving from lying on your back to sitting on the side of a flat bed without using bedrails?: A Little Help needed moving to and from a bed to a chair (including a wheelchair)?: A Little Help needed standing up from a chair using your arms (e.g., wheelchair or bedside chair)?: A Little Help needed to walk in hospital room?: A Little Help needed climbing 3-5 steps with a railing? : A Little 6 Click Score: 18    End of Session Equipment Utilized During Treatment: Gait belt Activity Tolerance: Patient tolerated treatment well Patient left: in bed;with call bell/phone within reach;with nursing/sitter in room Nurse Communication: Mobility status PT Visit Diagnosis: Unsteadiness on feet (R26.81);Difficulty in walking, not elsewhere classified (R26.2)    Time: 3976-7341 PT Time Calculation (min) (ACUTE ONLY): 28 min   Charges:   PT Evaluation $PT Eval Low Complexity: 1 Low PT Treatments $Gait Training: 8-22 mins        Debe Coder PT Acute Rehabilitation Services Pager [phone] Office [phone]   Pauline Pegues 12/29/2020, 12:39 PM

## 2020-12-29 NOTE — Progress Notes (Signed)
PROGRESS NOTE    [name]  HEN:[id number] DOB: 26-Feb-1946 DOA: 12/27/2020 PCP: Deland Pretty, MD   Brief Narrative: 75 year old male admitted with small bowel obstruction with loop of bowel inside complicated ventral hernia status postrepair by Dr. Johney Maine 11/28/2020.  Assessment & Plan:   Principal Problem:   Incarcerated incisional hernia with SBO Active Problems:   Allergic rhinitis   SBO (small bowel obstruction) (HCC)   Gastroesophageal reflux disease without esophagitis   Generalized anxiety disorder   Hyperlipidemia   Incarcerated ventral hernia   Recurrent ventral incisional hernia  #1 status post laparoscopic repair of complex ventral hernia with small bowel obstruction Dr. Johney Maine 12/28/2020.  Patient has not had moved his bowels or have flatus.  Abdomen distended.  Encourage him to get out of bed today and ambulate.  Minimize narcotics. Follow-up labs today sodium 137 potassium 4.0 White count 7.5.   #2 hyperlipidemia on Crestor at home will restart prior to discharge.  #3 anxiety/depression on Zoloft will restart prior to discharge.    Estimated body mass index is 29.84 kg/m as calculated from the following:   Height as of this encounter: 6' (1.829 m).   Weight as of this encounter: 99.8 kg.  DVT prophylaxis: Lovenox Code Status: Full code Family Communication: None at bedside  disposition Plan:  Status is: Inpatient  Dispo: The patient is from: Home              Anticipated d/c is to: Home              Patient currently is not medically stable to d/c.   Difficult to place patient No    Consultants: General surgery  Procedures: Ventral hernia repair 12/28/2020 Antimicrobials: None  Subjective: Patient resting in bed awake alert pleasant.  Has not had a BM or passed flatus.  Abdomen distended.  Objective: Vitals:   12/28/20 2038 12/29/20 0046 12/29/20 0445 12/29/20 1018  BP: 105/74 109/72 100/70 98/65  Pulse: 83 73 70 72  Resp: 15 15 15 18    Temp: (!) 97.4 F (36.3 C) 98 F (36.7 C) 98.1 F (36.7 C) 98.3 F (36.8 C)  TempSrc: Oral Oral Oral Oral  SpO2: 93% 96% 96% 93%  Weight:      Height:        Intake/Output Summary (Last 24 hours) at 12/29/2020 1109 Last data filed at 12/29/2020 1000 Gross per 24 hour  Intake 3150.68 ml  Output 1850 ml  Net 1300.68 ml   Filed Weights   12/27/20 2023  Weight: 99.8 kg    Examination:  General exam: Appears calm and comfortable  Respiratory system: Clear to auscultation. Respiratory effort normal. Cardiovascular system: S1 & S2 heard, RRR. No JVD, murmurs, rubs, gallops or clicks. No pedal edema. Gastrointestinal system: Abdomen is distended, soft and nontender. No organomegaly or masses felt. Normal bowel sounds heard. Central nervous system: Alert and oriented. No focal neurological deficits. Extremities: Symmetric 5 x 5 power. Skin: No rashes, lesions or ulcers Psychiatry: Judgement and insight appear normal. Mood & affect appropriate.     Data Reviewed: I have personally reviewed following labs and imaging studies  CBC: Recent Labs  Lab 12/27/20 2135 12/28/20 0400 12/29/20 0923  WBC 7.0 6.5 7.5  NEUTROABS 4.3 3.2  --   HGB 14.7 13.1 12.1*  HCT 43.2 40.0 36.9*  MCV 94.1 97.6 97.1  PLT 203 187 361   Basic Metabolic Panel: Recent Labs  Lab 12/27/20 2135 12/28/20 0400 12/29/20 0923  NA 134*  137 137  K 4.2 4.6 4.0  CL 101 103 103  CO2 24 28 27   GLUCOSE 136* 120* 96  BUN 17 18 13   CREATININE 1.04 1.03 1.06  CALCIUM 9.3 9.1 8.9   GFR: Estimated Creatinine Clearance: 72.5 mL/min (by C-G formula based on SCr of 1.06 mg/dL). Liver Function Tests: Recent Labs  Lab 12/27/20 2135 12/28/20 0400  AST 26 22  ALT 17 17  ALKPHOS 90 74  BILITOT 0.9 0.7  PROT 7.5 6.4*  ALBUMIN 3.8 3.5   Recent Labs  Lab 12/27/20 2135  LIPASE 26   No results for input(s): AMMONIA in the last 168 hours. Coagulation Profile: No results for input(s): INR, PROTIME in the  last 168 hours. Cardiac Enzymes: No results for input(s): CKTOTAL, CKMB, CKMBINDEX, TROPONINI in the last 168 hours. BNP (last 3 results) No results for input(s): PROBNP in the last 8760 hours. HbA1C: No results for input(s): HGBA1C in the last 72 hours. CBG: No results for input(s): GLUCAP in the last 168 hours. Lipid Profile: No results for input(s): CHOL, HDL, LDLCALC, TRIG, CHOLHDL, LDLDIRECT in the last 72 hours. Thyroid Function Tests: No results for input(s): TSH, T4TOTAL, FREET4, T3FREE, THYROIDAB in the last 72 hours. Anemia Panel: No results for input(s): VITAMINB12, FOLATE, FERRITIN, TIBC, IRON, RETICCTPCT in the last 72 hours. Sepsis Labs: No results for input(s): PROCALCITON, LATICACIDVEN in the last 168 hours.  Recent Results (from the past 240 hour(s))  Resp Panel by RT-PCR (Flu A&B, Covid) Nasopharyngeal Swab     Status: None   Collection Time: 12/27/20 10:59 PM   Specimen: Nasopharyngeal Swab; Nasopharyngeal(NP) swabs in vial transport medium  Result Value Ref Range Status   SARS Coronavirus 2 by RT PCR NEGATIVE NEGATIVE Final    Comment: (NOTE) SARS-CoV-2 target nucleic acids are NOT DETECTED.  The SARS-CoV-2 RNA is generally detectable in upper respiratory specimens during the acute phase of infection. The lowest concentration of SARS-CoV-2 viral copies this assay can detect is 138 copies/mL. A negative result does not preclude SARS-Cov-2 infection and should not be used as the sole basis for treatment or other patient management decisions. A negative result may occur with  improper specimen collection/handling, submission of specimen other than nasopharyngeal swab, presence of viral mutation(s) within the areas targeted by this assay, and inadequate number of viral copies(<138 copies/mL). A negative result must be combined with clinical observations, patient history, and epidemiological information. The expected result is Negative.  Fact Sheet for Patients:   EntrepreneurPulse.com.au  Fact Sheet for Healthcare Providers:  IncredibleEmployment.be  This test is no t yet approved or cleared by the [place] FDA and  has been authorized for detection and/or diagnosis of SARS-CoV-2 by FDA under an Emergency Use Authorization (EUA). This EUA will remain  in effect (meaning this test can be used) for the duration of the COVID-19 declaration under Section 564(b)(1) of the Act, 21 U.S.C.section 360bbb-3(b)(1), unless the authorization is terminated  or revoked sooner.       Influenza A by PCR NEGATIVE NEGATIVE Final   Influenza B by PCR NEGATIVE NEGATIVE Final    Comment: (NOTE) The Xpert Xpress SARS-CoV-2/FLU/RSV plus assay is intended as an aid in the diagnosis of influenza from Nasopharyngeal swab specimens and should not be used as a sole basis for treatment. Nasal washings and aspirates are unacceptable for Xpert Xpress SARS-CoV-2/FLU/RSV testing.  Fact Sheet for Patients: EntrepreneurPulse.com.au  Fact Sheet for Healthcare Providers: IncredibleEmployment.be  This test is not yet approved or cleared  by the [place] and has been authorized for detection and/or diagnosis of SARS-CoV-2 by FDA under an Emergency Use Authorization (EUA). This EUA will remain in effect (meaning this test can be used) for the duration of the COVID-19 declaration under Section 564(b)(1) of the Act, 21 U.S.C. section 360bbb-3(b)(1), unless the authorization is terminated or revoked.  Performed at [facility], [address], [place], [place] [address]   Surgical pcr screen     Status: Abnormal   Collection Time: 12/28/20  8:42 AM   Specimen: Nasal Mucosa; Nasal Swab  Result Value Ref Range Status   MRSA, PCR NEGATIVE NEGATIVE Final   Staphylococcus aureus POSITIVE (A) NEGATIVE Final    Comment: (NOTE) The Xpert SA Assay (FDA approved for NASAL specimens in  patients 91 years of age and older), is one component of a comprehensive surveillance program. It is not intended to diagnose infection nor to guide or monitor treatment. Performed at [facility], [place] [address], [place], [place] [address]          Radiology Studies: CT ABDOMEN PELVIS WO CONTRAST  Result Date: 12/27/2020 CLINICAL DATA:  Acute, nonlocalized abdominal pain and vomiting. EXAM: CT ABDOMEN AND PELVIS WITHOUT CONTRAST TECHNIQUE: Multidetector CT imaging of the abdomen and pelvis was performed following the standard protocol without IV contrast. COMPARISON:  10/26/2020 FINDINGS: Lower chest: Mild bibasilar pulmonary fibrotic change. The visualized heart and pericardium are unremarkable. Hepatobiliary: No focal liver abnormality is seen. No gallstones, gallbladder wall thickening, or biliary dilatation. Pancreas: Unremarkable Spleen: Unremarkable Adrenals/Urinary Tract: Adrenal glands are unremarkable. Kidneys are normal, without renal calculi, focal lesion, or hydronephrosis. Bladder is unremarkable. Stomach/Bowel: A mid small bowel obstruction is present with the transition point representing a single loop of a small-bowel within a complex supraumbilical ventral hernia. This is best seen on axial image # 43 and sagittal image # 58. The proximal small bowel is mildly distended with gas and fluid. The distal small bowel is decompressed. Partial small bowel resection has been performed. There is no free intraperitoneal gas or fluid. The stomach, distal small bowel, and colon are unremarkable. The appendix is unremarkable. Vascular/Lymphatic: Moderate aortoiliac atherosclerotic calcification is present. The distal left external iliac artery appears stenotic, though this is not well assessed on this noncontrast examination. Femoral-femoral bypass grafting has been performed. No pathologic adenopathy within the abdomen and pelvis. Reproductive: Prostate is unremarkable.  Other: Complex supraumbilical ventral hernia is present containing at least 3 separate hernia sacs containing mesenteric fat as well as the single loop of small bowel resulting in the small-bowel obstruction described above. Additionally, a a moderate fat containing left inguinal hernia is present with the hernia sac draped around the bypass graft. The rectum is unremarkable. Musculoskeletal: No lytic or blastic bone lesion. Remote appearing compression fracture of T11 is noted with approximately 20-30% loss of height. An age indeterminate compression fracture of T10 is identified demonstrating 30-40% loss of height. There is no retropulsion associated with either of these fractures. Advanced degenerative changes are noted within the lumbar spine. IMPRESSION: Mid small bowel obstruction secondary to a herniated loop of small bowel within a complex supraumbilical ventral hernia. Complex ventral hernia contains at least 3 separate hernia sacs containing mesenteric fat as well as the point of transition within the mid small bowel. Peripheral vascular disease. Femoral femoral bypass grafting noted. Stenosis of the distal left external iliac artery is suspected, though not well assessed on this noncontrast examination. Remote T11 compression  fracture. Age indeterminate T10 compression fracture with 30-40% loss of height. Correlation for point tenderness may be helpful in helping determine acuity. If indicated, this could be better assessed with MRI examination. No associated retropulsion with either of these fractures. Aortic Atherosclerosis (ICD10-I70.0). Electronically Signed   By: Fidela Salisbury MD   On: 12/27/2020 22:28        Scheduled Meds: . acetaminophen  1,000 mg Oral Q6H  . aspirin EC  81 mg Oral Daily  . Chlorhexidine Gluconate Cloth  6 each Topical Once  . Chlorhexidine Gluconate Cloth  6 each Topical Q0600  . gabapentin  300 mg Oral QHS  . lip balm  1 application Topical BID  . mupirocin  ointment  1 application Nasal BID  . pantoprazole  40 mg Oral Q1200  . polycarbophil  625 mg Oral BID  . rosuvastatin  10 mg Oral Daily  . sodium chloride flush  3 mL Intravenous Q12H   Continuous Infusions: . sodium chloride    . lactated ringers    . lactated ringers    . methocarbamol (ROBAXIN) IV 500 mg (12/29/20 0626)  . ondansetron (ZOFRAN) IV       LOS: 1 day     Georgette Shell, MD 12/29/2020, 11:09 AM

## 2020-12-29 NOTE — Progress Notes (Signed)
Progress Note  1 Day Post-Op  Subjective: Patient reports pain with coughing, but otherwise comfortable when still. He is not passing flatus yet. Denies nausea with liquids.   Objective: Vital signs in last 24 hours: Temp:  [97.4 F (36.3 C)-98.5 F (36.9 C)] 98.1 F (36.7 C) (05/19 0445) Pulse Rate:  [59-90] 70 (05/19 0445) Resp:  [14-20] 15 (05/19 0445) BP: (100-147)/(62-79) 100/70 (05/19 0445) SpO2:  [88 %-100 %] 96 % (05/19 0445) Last BM Date: 12/27/20  Intake/Output from previous day: 05/18 0701 - 05/19 0700 In: 2990.7 [P.O.:480; I.V.:2310.7; IV Piggyback:200] Out: 2150 [Urine:2100; Blood:50] Intake/Output this shift: No intake/output data recorded.  PE: General: pleasant, WD, overweight male who is laying in bed in NAD Heart: regular, rate, and rhythm.   Lungs: CTAB, no wheezes, rhonchi, or rales noted.  Respiratory effort nonlabored Abd: soft, appropriately ttp, ND, +BS, incisions with clean dressings present, abdominal binder was present  MS: all 4 extremities are symmetrical with no cyanosis, clubbing, or edema. Skin: warm and dry with no masses, lesions, or rashes Neuro: Cranial nerves 2-12 grossly intact, sensation is normal throughout Psych: A&Ox3 with an appropriate affect.    Lab Results:  Recent Labs    12/27/20 2135 12/28/20 0400  WBC 7.0 6.5  HGB 14.7 13.1  HCT 43.2 40.0  PLT 203 187   BMET Recent Labs    12/27/20 2135 12/28/20 0400  NA 134* 137  K 4.2 4.6  CL 101 103  CO2 24 28  GLUCOSE 136* 120*  BUN 17 18  CREATININE 1.04 1.03  CALCIUM 9.3 9.1   PT/INR No results for input(s): LABPROT, INR in the last 72 hours. CMP     Component Value Date/Time   NA 137 12/28/2020 0400   K 4.6 12/28/2020 0400   CL 103 12/28/2020 0400   CO2 28 12/28/2020 0400   GLUCOSE 120 (H) 12/28/2020 0400   BUN 18 12/28/2020 0400   CREATININE 1.03 12/28/2020 0400   CALCIUM 9.1 12/28/2020 0400   PROT 6.4 (L) 12/28/2020 0400   ALBUMIN 3.5 12/28/2020  0400   AST 22 12/28/2020 0400   ALT 17 12/28/2020 0400   ALKPHOS 74 12/28/2020 0400   BILITOT 0.7 12/28/2020 0400   GFRNONAA >60 12/28/2020 0400   GFRAA >60 02/02/2019 1520   Lipase     Component Value Date/Time   LIPASE 26 12/27/2020 2135       Studies/Results: CT ABDOMEN PELVIS WO CONTRAST  Result Date: 12/27/2020 CLINICAL DATA:  Acute, nonlocalized abdominal pain and vomiting. EXAM: CT ABDOMEN AND PELVIS WITHOUT CONTRAST TECHNIQUE: Multidetector CT imaging of the abdomen and pelvis was performed following the standard protocol without IV contrast. COMPARISON:  10/26/2020 FINDINGS: Lower chest: Mild bibasilar pulmonary fibrotic change. The visualized heart and pericardium are unremarkable. Hepatobiliary: No focal liver abnormality is seen. No gallstones, gallbladder wall thickening, or biliary dilatation. Pancreas: Unremarkable Spleen: Unremarkable Adrenals/Urinary Tract: Adrenal glands are unremarkable. Kidneys are normal, without renal calculi, focal lesion, or hydronephrosis. Bladder is unremarkable. Stomach/Bowel: A mid small bowel obstruction is present with the transition point representing a single loop of a small-bowel within a complex supraumbilical ventral hernia. This is best seen on axial image # 43 and sagittal image # 58. The proximal small bowel is mildly distended with gas and fluid. The distal small bowel is decompressed. Partial small bowel resection has been performed. There is no free intraperitoneal gas or fluid. The stomach, distal small bowel, and colon are unremarkable. The appendix is unremarkable.  Vascular/Lymphatic: Moderate aortoiliac atherosclerotic calcification is present. The distal left external iliac artery appears stenotic, though this is not well assessed on this noncontrast examination. Femoral-femoral bypass grafting has been performed. No pathologic adenopathy within the abdomen and pelvis. Reproductive: Prostate is unremarkable. Other: Complex  supraumbilical ventral hernia is present containing at least 3 separate hernia sacs containing mesenteric fat as well as the single loop of small bowel resulting in the small-bowel obstruction described above. Additionally, a a moderate fat containing left inguinal hernia is present with the hernia sac draped around the bypass graft. The rectum is unremarkable. Musculoskeletal: No lytic or blastic bone lesion. Remote appearing compression fracture of T11 is noted with approximately 20-30% loss of height. An age indeterminate compression fracture of T10 is identified demonstrating 30-40% loss of height. There is no retropulsion associated with either of these fractures. Advanced degenerative changes are noted within the lumbar spine. IMPRESSION: Mid small bowel obstruction secondary to a herniated loop of small bowel within a complex supraumbilical ventral hernia. Complex ventral hernia contains at least 3 separate hernia sacs containing mesenteric fat as well as the point of transition within the mid small bowel. Peripheral vascular disease. Femoral femoral bypass grafting noted. Stenosis of the distal left external iliac artery is suspected, though not well assessed on this noncontrast examination. Remote T11 compression fracture. Age indeterminate T10 compression fracture with 30-40% loss of height. Correlation for point tenderness may be helpful in helping determine acuity. If indicated, this could be better assessed with MRI examination. No associated retropulsion with either of these fractures. Aortic Atherosclerosis (ICD10-I70.0). Electronically Signed   By: Fidela Salisbury MD   On: 12/27/2020 22:28    Anti-infectives: Anti-infectives (From admission, onward)   Start     Dose/Rate Route Frequency Ordered Stop   12/28/20 2200  ceFAZolin (ANCEF) IVPB 2g/100 mL premix        2 g 200 mL/hr over 30 Minutes Intravenous Every 8 hours 12/28/20 1740 12/29/20 0606   12/28/20 0900  ceFAZolin (ANCEF) IVPB 2g/100  mL premix       "And" Linked Group Details   2 g 200 mL/hr over 30 Minutes Intravenous On call to O.R. 12/28/20 0812 12/28/20 1346   12/28/20 0900  metroNIDAZOLE (FLAGYL) IVPB 500 mg       "And" Linked Group Details   500 mg 100 mL/hr over 60 Minutes Intravenous On call to O.R. 12/28/20 0812 12/28/20 1359       Assessment/Plan Interstitial lung disease/Hx tobacco use GERD Depression Hyperlipidemia PVD with prior right to left femorofemoral bypass graft 01/13/2019 Left radical orchiectomy 01/12/2019 CKD - Cr 1.03 pre-op Chronic constipation  Complex ventral hernia with SBO S/P laparoscopic repair of incisional recurrent incarcerated abdominal wall hernia with mesh, LOA, bilateral TAP block 12/28/20 Dr. Johney Maine - POD#1 - patient tolerating FLD without n/v - ok to advance to soft diet - needs to mobilize today, PT/OT consults, abdominal binder when up - repeat AM labs today  - await return in bowel function  - may be ready for discharge in 1-2 days if continuing to progress well  FEN: soft diet  VTE: SCDs, lovenox ID: [id number]/flagyl 5/18 pre-op  LOS: 1 day    Norm Parcel, [facility] 12/29/2020, 9:05 AM Please see Amion for pager number during day hours 7:00am-4:30pm

## 2020-12-30 DIAGNOSIS — K430 Incisional hernia with obstruction, without gangrene: Secondary | ICD-10-CM | POA: Diagnosis not present

## 2020-12-30 LAB — COMPREHENSIVE METABOLIC PANEL
ALT: 14 U/L (ref 0–44)
AST: 28 U/L (ref 15–41)
Albumin: 3.2 g/dL — ABNORMAL LOW (ref 3.5–5.0)
Alkaline Phosphatase: 72 U/L (ref 38–126)
Anion gap: 7 (ref 5–15)
BUN: 12 mg/dL (ref 8–23)
CO2: 28 mmol/L (ref 22–32)
Calcium: 8.9 mg/dL (ref 8.9–10.3)
Chloride: 102 mmol/L (ref 98–111)
Creatinine, Ser: 1.03 mg/dL (ref 0.61–1.24)
GFR, Estimated: >60 mL/min (ref >60)
Glucose, Bld: 107 mg/dL — ABNORMAL HIGH (ref 70–99)
Potassium: 4.2 mmol/L (ref 3.5–5.1)
Sodium: 137 mmol/L (ref 135–145)
Total Bilirubin: 0.2 mg/dL — ABNORMAL LOW (ref 0.3–1.2)
Total Protein: 6.5 g/dL (ref 6.5–8.1)

## 2020-12-30 LAB — CBC
HCT: 35.5 % — ABNORMAL LOW (ref 39.0–52.0)
Hemoglobin: 11.5 g/dL — ABNORMAL LOW (ref 13.0–17.0)
MCH: 31.7 pg (ref 26.0–34.0)
MCHC: 32.4 g/dL (ref 30.0–36.0)
MCV: 97.8 fL (ref 80.0–100.0)
Platelets: 176 10*3/uL (ref 150–400)
RBC: 3.63 MIL/uL — ABNORMAL LOW (ref 4.22–5.81)
RDW: 13.1 % (ref 11.5–15.5)
WBC: 7.5 10*3/uL (ref 4.0–10.5)
nRBC: 0 % (ref 0.0–0.2)

## 2020-12-30 MED ORDER — SODIUM CHLORIDE 0.9 % IV SOLN: INTRAVENOUS | Status: AC

## 2020-12-30 MED ORDER — TRAMADOL HCL 50 MG PO TABS
50.0000 mg | ORAL_TABLET | Freq: Four times a day (QID) | ORAL | Status: DC | PRN
  Administered 2020-12-30: 100 mg via ORAL
  Administered 2020-12-31: 50 mg via ORAL
  Filled 2020-12-30: qty 2
  Filled 2020-12-30: qty 1

## 2020-12-30 MED ORDER — DOCUSATE SODIUM 100 MG PO CAPS
100.0000 mg | ORAL_CAPSULE | Freq: Two times a day (BID) | ORAL | Status: DC
  Administered 2020-12-30 – 2021-01-04 (×11): 100 mg via ORAL
  Filled 2020-12-30 (×12): qty 1

## 2020-12-30 MED ORDER — MORPHINE SULFATE (PF) 2 MG/ML IV SOLN: 2.0000 mg | INTRAVENOUS | Status: DC | PRN

## 2020-12-30 MED ORDER — POLYETHYLENE GLYCOL 3350 17 G PO PACK
17.0000 g | PACK | Freq: Every day | ORAL | Status: DC
  Administered 2020-12-30 – 2021-01-04 (×6): 17 g via ORAL
  Filled 2020-12-30 (×6): qty 1

## 2020-12-30 NOTE — Care Management Important Message (Signed)
Important Message  Patient Details IM Letter given to the Patient. Name: [name] MRN: [id number] Date of Birth: [birth year]   Medicare Important Message Given:  Yes     Kerin Salen 12/30/2020, 10:46 AM

## 2020-12-30 NOTE — Progress Notes (Addendum)
PROGRESS NOTE    [name]  BJS:[id number] DOB: 1945-12-18 DOA: 12/27/2020 PCP: Deland Pretty, MD   Brief Narrative: 75 year old male admitted with small bowel obstruction with loop of bowel inside complicated ventral hernia status postrepair by Dr. Johney Maine 11/28/2020.  Assessment & Plan:   Principal Problem:   Incarcerated incisional hernia with SBO Active Problems:   Allergic rhinitis   SBO (small bowel obstruction) (HCC)   Gastroesophageal reflux disease without esophagitis   Generalized anxiety disorder   Hyperlipidemia   Incarcerated ventral hernia   Recurrent ventral incisional hernia  #1 status post laparoscopic repair of complex ventral hernia with small bowel obstruction Dr. Johney Maine 12/28/2020.  Patient has not had moved his bowels or have flatus.  Abdomen distended.  Encourage him to get out of bed today and ambulate.  Minimize narcotics.  #2 hyperlipidemia on Crestor   #3 anxiety/depression on Zoloft   #4 hypotension his blood pressure is very soft and he was symptomatic with dizziness while walking.  I will start him on normal saline 100 cc/h for 24 hours.  Hold any BP meds.    Estimated body mass index is 29.84 kg/m as calculated from the following:   Height as of this encounter: 6' (1.829 m).   Weight as of this encounter: 99.8 kg.  DVT prophylaxis: Lovenox Code Status: Full code Family Communication: None at bedside  disposition Plan:  Status is: Inpatient  Dispo: The patient is from: Home              Anticipated d/c is to: Home              Patient currently is not medically stable to d/c.   Difficult to place patient No    Consultants: General surgery  Procedures: Ventral hernia repair 12/28/2020 Antimicrobials: None  Subjective: Patient is resting in bed he still has not had a bowel movement abdomen is distended and painful He reports ambulating yesterday in the hallway but felt lightheaded.  He has a soft blood pressure.     Objective: Vitals:   12/29/20 1320 12/29/20 2209 12/30/20 0529 12/30/20 1333  BP: 113/74 104/68 97/64 106/67  Pulse: 71 71 73 64  Resp: 18  17 18   Temp: 98.6 F (37 C) 98.7 F (37.1 C) 99.1 F (37.3 C) 97.6 F (36.4 C)  TempSrc: Oral Oral  Oral  SpO2: 100% 93% 92% 95%  Weight:      Height:        Intake/Output Summary (Last 24 hours) at 12/30/2020 1410 Last data filed at 12/30/2020 1307 Gross per 24 hour  Intake 820 ml  Output 4575 ml  Net -3755 ml   Filed Weights   12/27/20 2023  Weight: 99.8 kg    Examination:  General exam: Appears calm and comfortable  Respiratory system: Clear to auscultation. Respiratory effort normal. Cardiovascular system: S1 & S2 heard, RRR. No JVD, murmurs, rubs, gallops or clicks. No pedal edema. Gastrointestinal system: Abdomen is distended, soft and appropriately tender. No organomegaly or masses felt. Normal bowel sounds heard. Central nervous system: Alert and oriented. No focal neurological deficits. Extremities: Symmetric 5 x 5 power. Skin: No rashes, lesions or ulcers Psychiatry: Judgement and insight appear normal. Mood & affect appropriate.     Data Reviewed: I have personally reviewed following labs and imaging studies  CBC: Recent Labs  Lab 12/27/20 2135 12/28/20 0400 12/29/20 0923  WBC 7.0 6.5 7.5  NEUTROABS 4.3 3.2  --   HGB 14.7 13.1 12.1*  HCT 43.2 40.0 36.9*  MCV 94.1 97.6 97.1  PLT 203 187 948   Basic Metabolic Panel: Recent Labs  Lab 12/27/20 2135 12/28/20 0400 12/29/20 0923  NA 134* 137 137  K 4.2 4.6 4.0  CL 101 103 103  CO2 24 28 27   GLUCOSE 136* 120* 96  BUN 17 18 13   CREATININE 1.04 1.03 1.06  CALCIUM 9.3 9.1 8.9   GFR: Estimated Creatinine Clearance: 72.5 mL/min (by C-G formula based on SCr of 1.06 mg/dL). Liver Function Tests: Recent Labs  Lab 12/27/20 2135 12/28/20 0400  AST 26 22  ALT 17 17  ALKPHOS 90 74  BILITOT 0.9 0.7  PROT 7.5 6.4*  ALBUMIN 3.8 3.5   Recent Labs  Lab  12/27/20 2135  LIPASE 26   No results for input(s): AMMONIA in the last 168 hours. Coagulation Profile: No results for input(s): INR, PROTIME in the last 168 hours. Cardiac Enzymes: No results for input(s): CKTOTAL, CKMB, CKMBINDEX, TROPONINI in the last 168 hours. BNP (last 3 results) No results for input(s): PROBNP in the last 8760 hours. HbA1C: No results for input(s): HGBA1C in the last 72 hours. CBG: No results for input(s): GLUCAP in the last 168 hours. Lipid Profile: No results for input(s): CHOL, HDL, LDLCALC, TRIG, CHOLHDL, LDLDIRECT in the last 72 hours. Thyroid Function Tests: No results for input(s): TSH, T4TOTAL, FREET4, T3FREE, THYROIDAB in the last 72 hours. Anemia Panel: No results for input(s): VITAMINB12, FOLATE, FERRITIN, TIBC, IRON, RETICCTPCT in the last 72 hours. Sepsis Labs: No results for input(s): PROCALCITON, LATICACIDVEN in the last 168 hours.  Recent Results (from the past 240 hour(s))  Resp Panel by RT-PCR (Flu A&B, Covid) Nasopharyngeal Swab     Status: None   Collection Time: 12/27/20 10:59 PM   Specimen: Nasopharyngeal Swab; Nasopharyngeal(NP) swabs in vial transport medium  Result Value Ref Range Status   SARS Coronavirus 2 by RT PCR NEGATIVE NEGATIVE Final    Comment: (NOTE) SARS-CoV-2 target nucleic acids are NOT DETECTED.  The SARS-CoV-2 RNA is generally detectable in upper respiratory specimens during the acute phase of infection. The lowest concentration of SARS-CoV-2 viral copies this assay can detect is 138 copies/mL. A negative result does not preclude SARS-Cov-2 infection and should not be used as the sole basis for treatment or other patient management decisions. A negative result may occur with  improper specimen collection/handling, submission of specimen other than nasopharyngeal swab, presence of viral mutation(s) within the areas targeted by this assay, and inadequate number of viral copies(<138 copies/mL). A negative result  must be combined with clinical observations, patient history, and epidemiological information. The expected result is Negative.  Fact Sheet for Patients:  EntrepreneurPulse.com.au  Fact Sheet for Healthcare Providers:  IncredibleEmployment.be  This test is no t yet approved or cleared by the [place] FDA and  has been authorized for detection and/or diagnosis of SARS-CoV-2 by FDA under an Emergency Use Authorization (EUA). This EUA will remain  in effect (meaning this test can be used) for the duration of the COVID-19 declaration under Section 564(b)(1) of the Act, 21 U.S.C.section 360bbb-3(b)(1), unless the authorization is terminated  or revoked sooner.       Influenza A by PCR NEGATIVE NEGATIVE Final   Influenza B by PCR NEGATIVE NEGATIVE Final    Comment: (NOTE) The Xpert Xpress SARS-CoV-2/FLU/RSV plus assay is intended as an aid in the diagnosis of influenza from Nasopharyngeal swab specimens and should not be used as a sole basis for treatment. Nasal  washings and aspirates are unacceptable for Xpert Xpress SARS-CoV-2/FLU/RSV testing.  Fact Sheet for Patients: EntrepreneurPulse.com.au  Fact Sheet for Healthcare Providers: IncredibleEmployment.be  This test is not yet approved or cleared by the [place] FDA and has been authorized for detection and/or diagnosis of SARS-CoV-2 by FDA under an Emergency Use Authorization (EUA). This EUA will remain in effect (meaning this test can be used) for the duration of the COVID-19 declaration under Section 564(b)(1) of the Act, 21 U.S.C. section 360bbb-3(b)(1), unless the authorization is terminated or revoked.  Performed at [facility], [address] [place], [place] [address]   Surgical pcr screen     Status: Abnormal   Collection Time: 12/28/20  8:42 AM   Specimen: Nasal Mucosa; Nasal Swab  Result Value Ref Range Status   MRSA,  PCR NEGATIVE NEGATIVE Final   Staphylococcus aureus POSITIVE (A) NEGATIVE Final    Comment: (NOTE) The Xpert SA Assay (FDA approved for NASAL specimens in patients 69 years of age and older), is one component of a comprehensive surveillance program. It is not intended to diagnose infection nor to guide or monitor treatment. Performed at [facility], [place] [address], [place], [place] [address]          Radiology Studies: No results found.      Scheduled Meds: . acetaminophen  1,000 mg Oral Q6H  . aspirin EC  81 mg Oral Daily  . Chlorhexidine Gluconate Cloth  6 each Topical Once  . Chlorhexidine Gluconate Cloth  6 each Topical Q0600  . docusate sodium  100 mg Oral BID  . gabapentin  300 mg Oral QHS  . lip balm  1 application Topical BID  . loratadine  10 mg Oral Daily  . mupirocin ointment  1 application Nasal BID  . pantoprazole  40 mg Oral Q1200  . polycarbophil  625 mg Oral BID  . polyethylene glycol  17 g Oral Daily  . rosuvastatin  10 mg Oral Daily  . sodium chloride flush  3 mL Intravenous Q12H   Continuous Infusions: . sodium chloride    . lactated ringers    . methocarbamol (ROBAXIN) IV 500 mg (12/30/20 6808)  . ondansetron (ZOFRAN) IV       LOS: 2 days     Georgette Shell, MD 12/30/2020, 2:10 PM

## 2020-12-30 NOTE — Progress Notes (Signed)
Progress Note  2 Days Post-Op  Subjective: Patient reports abdominal pain well controlled but pain meds make him dizzy, he is not on PO pain regimen yet. We discussed transition to this today. He passed some flatus yesterday but has not yet today, no BM. Reiterated importance of mobilization.   Objective: Vital signs in last 24 hours: Temp:  [98.6 F (37 C)-99.1 F (37.3 C)] 99.1 F (37.3 C) (05/20 0529) Pulse Rate:  [71-73] 73 (05/20 0529) Resp:  [17-18] 17 (05/20 0529) BP: (97-113)/(64-74) 97/64 (05/20 0529) SpO2:  [92 %-100 %] 92 % (05/20 0529) Last BM Date: 12/27/20  Intake/Output from previous day: 05/19 0701 - 05/20 0700 In: 640 [P.O.:540; IV Piggyback:100] Out: 2675 [Urine:2675] Intake/Output this shift: Total I/O In: 240 [P.O.:240] Out: 1500 [Urine:1500]  PE: General: pleasant, WD,overweightmale who is sitting up Heart: regular, rate, and rhythm.  Lungs: CTAB, no wheezes, rhonchi, or rales noted. Respiratory effort nonlabored Abd: soft, appropriately ttp, ND, +BS, incisions c/d/i, abdominal binder was present  MS: all 4 extremities are symmetrical with no cyanosis, clubbing, or edema. Skin: warm and dry with no masses, lesions, or rashes Neuro: Cranial nerves 2-12 grossly intact, sensation is normal throughout Psych: A&Ox3 with an appropriate affect.   Lab Results:  Recent Labs    12/28/20 0400 12/29/20 0923  WBC 6.5 7.5  HGB 13.1 12.1*  HCT 40.0 36.9*  PLT 187 176   BMET Recent Labs    12/28/20 0400 12/29/20 0923  NA 137 137  K 4.6 4.0  CL 103 103  CO2 28 27  GLUCOSE 120* 96  BUN 18 13  CREATININE 1.03 1.06  CALCIUM 9.1 8.9   PT/INR No results for input(s): LABPROT, INR in the last 72 hours. CMP     Component Value Date/Time   NA 137 12/29/2020 0923   K 4.0 12/29/2020 0923   CL 103 12/29/2020 0923   CO2 27 12/29/2020 0923   GLUCOSE 96 12/29/2020 0923   BUN 13 12/29/2020 0923   CREATININE 1.06 12/29/2020 0923   CALCIUM 8.9  12/29/2020 0923   PROT 6.4 (L) 12/28/2020 0400   ALBUMIN 3.5 12/28/2020 0400   AST 22 12/28/2020 0400   ALT 17 12/28/2020 0400   ALKPHOS 74 12/28/2020 0400   BILITOT 0.7 12/28/2020 0400   GFRNONAA >60 12/29/2020 0923   GFRAA >60 02/02/2019 1520   Lipase     Component Value Date/Time   LIPASE 26 12/27/2020 2135       Studies/Results: No results found.  Anti-infectives: Anti-infectives (From admission, onward)   Start     Dose/Rate Route Frequency Ordered Stop   12/28/20 2200  ceFAZolin (ANCEF) IVPB 2g/100 mL premix        2 g 200 mL/hr over 30 Minutes Intravenous Every 8 hours 12/28/20 1740 12/29/20 0606   12/28/20 0900  ceFAZolin (ANCEF) IVPB 2g/100 mL premix       "And" Linked Group Details   2 g 200 mL/hr over 30 Minutes Intravenous On call to O.R. 12/28/20 0812 12/28/20 1346   12/28/20 0900  metroNIDAZOLE (FLAGYL) IVPB 500 mg       "And" Linked Group Details   500 mg 100 mL/hr over 60 Minutes Intravenous On call to O.R. 12/28/20 0812 12/28/20 1359       Assessment/Plan Interstitial lung disease/Hx tobacco use GERD Depression Hyperlipidemia PVD with prior right to left femorofemoral bypass graft 01/13/2019 Left radical orchiectomy 01/12/2019 CKD Chronic constipation  Complex ventral hernia with SBO S/P laparoscopic  repair of incisional recurrent incarcerated abdominal wall hernia with mesh, LOA, bilateral TAP block 12/28/20 Dr. Johney Maine - POD#2 - patient tolerating soft diet - added fiber and bowel regimen  - continue to mobilize, abdominal binder when up - start PO pain medication today and limit IV pain meds to breakthrough use only  - likely home tomorrow if pain well controlled on PO meds and still having bowel function   FEN: soft diet, SLIV VTE: SCDs, lovenox ID: Ancef/flagyl 5/18 pre-op  LOS: 2 days    Norm Parcel, [facility] 12/30/2020, 10:24 AM Please see Amion for pager number during day hours 7:00am-4:30pm

## 2020-12-30 NOTE — Evaluation (Signed)
Occupational Therapy Evaluation Patient Details Name: [name] MRN: [id number] DOB: August 09, 1946 Today's Date: 12/30/2020    History of Present Illness Pt s/p laparoscopic repair of incarcerated incisional hernia with SBO and lysis of adhesions.  Pt wtih hx of lumbar laminectomy, hernia repair (09), and femoral-femoral bypass graft in 2020   Clinical Impression   Patient evaluated by Occupational Therapy with no further acute OT needs identified. Pt is not yet at baseline due to continued incisional discomfort and difficulty tolerating bending anteriorly.  All education has been completed on adaptive equipment and compensatory methods to allow pt increased independence with ADLs, and the patient has no further questions.  Pt scored a 5 on 30 Second Sit to Stand test, and was given feedback on fall prevention in hospital and at home.  See below for any follow-up Occupational Therapy or equipment needs. OT is signing off. Thank you for this referral.      Follow Up Recommendations  No OT follow up    Equipment Recommendations   (Pt educated on adaptive equipment options and uses.)    Recommendations for Other Services       Precautions / Restrictions Precautions Precautions: Fall Restrictions Weight Bearing Restrictions: No      Mobility Bed Mobility Overal bed mobility: Needs Assistance Bed Mobility: Rolling;Sit to Sidelying;Sidelying to Sit Rolling: Supervision Sidelying to sit: Min guard     Sit to sidelying: Min guard General bed mobility comments: use of bed rail; self-cues for proper technique    Transfers Overall transfer level: Needs assistance Equipment used: Rolling walker (2 wheeled) Transfers: Sit to/from Stand Sit to Stand: Supervision              Balance Overall balance assessment: Needs assistance Sitting-balance support: No upper extremity supported;Feet supported Sitting balance-Leahy Scale: Good     Standing balance support: No upper  extremity supported Standing balance-Leahy Scale: Good Standing balance comment: Pt scored 5 repititions on 30 Second Sit to Stand test with indicates a higher risk of falling than usual, however pt using increased time for Stand<>sit transitions due to incisional pain and pt appearing steady in static standing for ADLs.                           ADL either performed or assessed with clinical judgement   ADL Overall ADL's : Needs assistance/impaired     Grooming: Wash/dry hands;Standing;Supervision/safety Grooming Details (indicate cue type and reason): With unilaterql UE support on vanity/sink Upper Body Bathing: Modified independent;Sitting     Lower Body Bathing Details (indicate cue type and reason): Pt educated on AE options including long handled bath brush or sponge to reduce forward bend and increasing abdominal pain. Upper Body Dressing : Set up;Sitting   Lower Body Dressing: Minimal assistance;Moderate assistance Lower Body Dressing Details (indicate cue type and reason): Pt unable to perform a complete figure 4 at EOB, and required Min-Mod as to don and doff socks. Toilet Transfer: Sales executive;Ambulation;RW   Toileting- Clothing Manipulation and Hygiene: Supervision/safety Toileting - Clothing Manipulation Details (indicate cue type and reason): Pt reports that he has been completing his own hygiene and was able to demonstrate sufficient BUE IR for throrough hygiene. Tub/ Shower Transfer: Min guard;Grab bars;Ambulation;Walk-in shower   Functional mobility during ADLs: Supervision/safety;Min guard       Vision   Vision Assessment?: No apparent visual deficits     Perception     Praxis  Pertinent Vitals/Pain Pain Assessment: 0-10 Pain Score: 1  Pain Location: abdomen, at rest. Pt endorses increased pain with mobilization but did not quantify. Pain Descriptors / Indicators: Sore Pain Intervention(s): Limited activity within  patient's tolerance;Monitored during session;Premedicated before session;Repositioned     Hand Dominance Right   Extremity/Trunk Assessment Upper Extremity Assessment Upper Extremity Assessment: Overall WFL for tasks assessed   Lower Extremity Assessment Lower Extremity Assessment: Overall WFL for tasks assessed   Cervical / Trunk Assessment Cervical / Trunk Assessment: Normal   Communication Communication Communication: HOH (Has hearing aids)   Cognition Arousal/Alertness: Awake/alert Behavior During Therapy: WFL for tasks assessed/performed Overall Cognitive Status: Within Functional Limits for tasks assessed                                     General Comments       Exercises Exercises: General Lower Extremity General Exercises - Lower Extremity Ankle Circles/Pumps: AROM;Both;15 reps;Supine   Shoulder Instructions      Home Living Family/patient expects to be discharged to:: Private residence Living Arrangements: Spouse/significant other (Wife has cancer and active RXT and chemo treatments ~3 days a week. 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        OT Problem List: Pain;Impaired balance (sitting and/or standing)      OT Treatment/Interventions:      OT Goals(Current goals can be found in the care plan section) Acute Rehab OT Goals Patient Stated Goal: HOME OT Goal  Formulation: With patient Potential to Achieve Goals: Good  OT Frequency:     Barriers to D/C:            Co-evaluation              AM-PAC OT "6 Clicks" Daily Activity     Outcome Measure Help from another person eating meals?: None Help from another person taking care of personal grooming?: None Help from another person toileting, which includes using toliet, bedpan, or urinal?: A Little Help from another person bathing (including washing, rinsing, drying)?: A Little Help from another person to put on and taking off regular upper body clothing?: None Help from another person to put on and taking off regular lower body clothing?: A Little 6 Click Score: 21   End of Session Equipment Utilized During Treatment: Gait belt;Rolling walker  Activity Tolerance: Patient tolerated treatment well Patient left: in bed;with call bell/phone within reach;with bed alarm set  OT Visit Diagnosis: Unsteadiness on feet (R26.81);Pain Pain - part of body:  (Abdomin)                Time: 7893-8101 OT Time Calculation (min): 22 min Charges:  OT General Charges $OT Visit: 1 Visit OT Evaluation $OT Eval Low Complexity: 1 Low  Kena Limon, OT Acute Rehab Services Office: [phone] 12/30/2020  Julien Girt 12/30/2020, 12:22 PM

## 2020-12-30 NOTE — Progress Notes (Signed)
Physical Therapy Treatment Patient Details Name: [name] MRN: [id number] DOB: Aug 27, 1945 Today's Date: 12/30/2020    History of Present Illness Pt s/p laparoscopic repair of incarcerated incisional hernia with SBO and lysis of adhesions.  Pt wtih hx of lumbar laminectomy, hernia repair (09), and femoral-femoral bypass graft in 2020    PT Comments    Pt very motivated and progressed to ambulation with SPC this am - mild instability with good safety awareness and no LOB noted.   Follow Up Recommendations  No PT follow up     Equipment Recommendations  None recommended by PT    Recommendations for Other Services       Precautions / Restrictions Precautions Precautions: Fall Restrictions Weight Bearing Restrictions: No    Mobility  Bed Mobility Overal bed mobility: Needs Assistance Bed Mobility: Rolling;Sit to Sidelying Rolling: Supervision       Sit to sidelying: Supervision General bed mobility comments: use of bed rail but self-cues for proper technique    Transfers Overall transfer level: Needs assistance Equipment used: Straight cane Transfers: Sit to/from Stand Sit to Stand: Supervision            Ambulation/Gait Ambulation/Gait assistance: Min guard;Supervision Gait Distance (Feet): 400 Feet Assistive device: Straight cane Gait Pattern/deviations: Step-through pattern;Shuffle;Wide base of support Gait velocity: decr   General Gait Details: Good safety awareness; mild instability with use of SPC but with no  LOB   Stairs             Wheelchair Mobility    Modified Rankin (Stroke Patients Only)       Balance Overall balance assessment: Needs assistance Sitting-balance support: No upper extremity supported;Feet supported Sitting balance-Leahy Scale: Good     Standing balance support: No upper extremity supported Standing balance-Leahy Scale: Good                              Cognition Arousal/Alertness:  Awake/alert Behavior During Therapy: WFL for tasks assessed/performed Overall Cognitive Status: Within Functional Limits for tasks assessed                                        Exercises General Exercises - Lower Extremity Ankle Circles/Pumps: AROM;Both;15 reps;Supine    General Comments        Pertinent Vitals/Pain Pain Assessment: 0-10 Pain Score: 4  Pain Location: abdomen Pain Descriptors / Indicators: Sore Pain Intervention(s): Limited activity within patient's tolerance;Monitored during session;Premedicated before session    Home Living                      Prior Function            PT Goals (current goals can now be found in the care plan section) Acute Rehab PT Goals Patient Stated Goal: HOME PT Goal Formulation: With patient Time For Goal Achievement: 01/12/21 Potential to Achieve Goals: Good Progress towards PT goals: Progressing toward goals    Frequency    Min 3X/week      PT Plan Current plan remains appropriate    Co-evaluation              AM-PAC PT "6 Clicks" Mobility   Outcome Measure  Help needed turning from your back to your side while in a flat bed without using bedrails?: A Little Help needed moving from lying on  your back to sitting on the side of a flat bed without using bedrails?: A Little Help needed moving to and from a bed to a chair (including a wheelchair)?: A Little Help needed standing up from a chair using your arms (e.g., wheelchair or bedside chair)?: A Little Help needed to walk in hospital room?: A Little Help needed climbing 3-5 steps with a railing? : A Little 6 Click Score: 18    End of Session Equipment Utilized During Treatment: Gait belt Activity Tolerance: Patient tolerated treatment well Patient left: in bed;with call bell/phone within reach Nurse Communication: Mobility status PT Visit Diagnosis: Unsteadiness on feet (R26.81);Difficulty in walking, not elsewhere classified  (R26.2)     Time: 4010-2725 PT Time Calculation (min) (ACUTE ONLY): 18 min  Charges:  $Gait Training: 8-22 mins                     [place] Pager [phone] Office [phone]    Dinnis Rog 12/30/2020, 10:30 AM

## 2020-12-31 MED ORDER — LORATADINE 10 MG PO TABS: 10.0000 mg | ORAL_TABLET | Freq: Every day | ORAL | Status: AC

## 2020-12-31 MED ORDER — POLYETHYLENE GLYCOL 3350 17 G PO PACK
34.0000 g | PACK | Freq: Once | ORAL | Status: AC
  Administered 2020-12-31: 34 g via ORAL
  Filled 2020-12-31: qty 2

## 2020-12-31 MED ORDER — BISACODYL 10 MG RE SUPP
10.0000 mg | Freq: Once | RECTAL | Status: AC
  Administered 2020-12-31: 10 mg via RECTAL
  Filled 2020-12-31: qty 1

## 2020-12-31 MED ORDER — CALCIUM POLYCARBOPHIL 625 MG PO TABS: 625.0000 mg | ORAL_TABLET | Freq: Two times a day (BID) | ORAL | Status: DC

## 2020-12-31 MED ORDER — POLYETHYLENE GLYCOL 3350 17 G PO PACK: 17.0000 g | PACK | Freq: Every day | ORAL | 0 refills | Status: DC

## 2020-12-31 NOTE — Discharge Summary (Signed)
Physician Discharge Summary  [name] GQQ:[id number] DOB: 21-Nov-1945 DOA: 12/27/2020  PCP: Deland Pretty, MD  Admit date: 12/27/2020 Discharge date: 12/31/2020  Admitted From: home Disposition: home  Recommendations for Outpatient Follow-up:  1. Follow up with PCP in 1-2 weeks 2. Please obtain BMP/CBC in one week  Home Health home Equipment/Devices:none  Discharge Condition:stable CODE STATUS:full Diet recommendation: cardiac Brief/Interim Summary:75 year old male admitted with small bowel obstruction with loop of bowel inside complicated ventral hernia status postrepair by Dr. Johney Maine 11/28/2020   Discharge Diagnoses:  Principal Problem:   Incarcerated incisional hernia with SBO Active Problems:   Allergic rhinitis   SBO (small bowel obstruction) (HCC)   Gastroesophageal reflux disease without esophagitis   Generalized anxiety disorder   Hyperlipidemia   Incarcerated ventral hernia   Recurrent ventral incisional hernia   #1 status post laparoscopic repair of complex ventral hernia with small bowel obstruction Dr. Johney Maine 12/28/2020. Had flatus no bms yet  #2 hyperlipidemia on Crestor   #3 anxiety/depression on Zoloft   #4 hypotension resolved with IVF.   Estimated body mass index is 29.84 kg/m as calculated from the following:   Height as of this encounter: 6' (1.829 m).   Weight as of this encounter: 99.8 kg.  Discharge Instructions  Discharge Instructions    Diet - low sodium heart healthy   Complete by: As directed    Increase activity slowly   Complete by: As directed      Allergies as of 12/31/2020   No Known Allergies     Medication List    STOP taking these medications   Pfizer-BioNTech COVID-19 Vacc 30 MCG/0.3ML injection Generic drug: COVID-19 mRNA vaccine (Pfizer)     TAKE these medications   acetaminophen 500 MG tablet Commonly known as: TYLENOL Take 500 mg by mouth every 6 (six) hours as needed for headache (pain).   aspirin EC  81 MG tablet Take 81 mg by mouth daily. Swallow whole.   cholecalciferol 25 MCG (1000 UNIT) tablet Commonly known as: VITAMIN D Take 1,000 Units by mouth at bedtime.   loratadine 10 MG tablet Commonly known as: CLARITIN Take 1 tablet (10 mg total) by mouth daily. Start taking on: Jan 01, 2021   omeprazole 10 MG capsule Commonly known as: PRILOSEC Take 10 mg by mouth every morning.   polycarbophil 625 MG tablet Commonly known as: FIBERCON Take 1 tablet (625 mg total) by mouth 2 (two) times daily.   polyethylene glycol 17 g packet Commonly known as: MIRALAX / GLYCOLAX Take 17 g by mouth daily. Start taking on: Jan 01, 2021   rosuvastatin 10 MG tablet Commonly known as: CRESTOR Take 10 mg by mouth daily.   sertraline 50 MG tablet Commonly known as: ZOLOFT Take 50 mg by mouth daily as needed (anxiety).       Follow-up Information    Michael Boston, MD Follow up in 3 week(s).   Specialties: General Surgery, Colon and Rectal Surgery Contact information: [address] [place] [address] [phone]        Deland Pretty, MD Follow up.   Specialty: Internal Medicine Contact information: [place] [address] [phone]              No Known Allergies  Consultations:  surgery   Procedures/Studies: CT ABDOMEN PELVIS WO CONTRAST  Result Date: 12/27/2020 CLINICAL DATA:  Acute, nonlocalized abdominal pain and vomiting. EXAM: CT ABDOMEN AND PELVIS WITHOUT CONTRAST TECHNIQUE: Multidetector CT imaging of the  abdomen and pelvis was performed following the standard protocol without IV contrast. COMPARISON:  10/26/2020 FINDINGS: Lower chest: Mild bibasilar pulmonary fibrotic change. The visualized heart and pericardium are unremarkable. Hepatobiliary: No focal liver abnormality is seen. No gallstones, gallbladder wall thickening, or biliary dilatation. Pancreas: Unremarkable Spleen: Unremarkable Adrenals/Urinary Tract:  Adrenal glands are unremarkable. Kidneys are normal, without renal calculi, focal lesion, or hydronephrosis. Bladder is unremarkable. Stomach/Bowel: A mid small bowel obstruction is present with the transition point representing a single loop of a small-bowel within a complex supraumbilical ventral hernia. This is best seen on axial image # 43 and sagittal image # 58. The proximal small bowel is mildly distended with gas and fluid. The distal small bowel is decompressed. Partial small bowel resection has been performed. There is no free intraperitoneal gas or fluid. The stomach, distal small bowel, and colon are unremarkable. The appendix is unremarkable. Vascular/Lymphatic: Moderate aortoiliac atherosclerotic calcification is present. The distal left external iliac artery appears stenotic, though this is not well assessed on this noncontrast examination. Femoral-femoral bypass grafting has been performed. No pathologic adenopathy within the abdomen and pelvis. Reproductive: Prostate is unremarkable. Other: Complex supraumbilical ventral hernia is present containing at least 3 separate hernia sacs containing mesenteric fat as well as the single loop of small bowel resulting in the small-bowel obstruction described above. Additionally, a a moderate fat containing left inguinal hernia is present with the hernia sac draped around the bypass graft. The rectum is unremarkable. Musculoskeletal: No lytic or blastic bone lesion. Remote appearing compression fracture of T11 is noted with approximately 20-30% loss of height. An age indeterminate compression fracture of T10 is identified demonstrating 30-40% loss of height. There is no retropulsion associated with either of these fractures. Advanced degenerative changes are noted within the lumbar spine. IMPRESSION: Mid small bowel obstruction secondary to a herniated loop of small bowel within a complex supraumbilical ventral hernia. Complex ventral hernia contains at least 3  separate hernia sacs containing mesenteric fat as well as the point of transition within the mid small bowel. Peripheral vascular disease. Femoral femoral bypass grafting noted. Stenosis of the distal left external iliac artery is suspected, though not well assessed on this noncontrast examination. Remote T11 compression fracture. Age indeterminate T10 compression fracture with 30-40% loss of height. Correlation for point tenderness may be helpful in helping determine acuity. If indicated, this could be better assessed with MRI examination. No associated retropulsion with either of these fractures. Aortic Atherosclerosis (ICD10-I70.0). Electronically Signed   By: Fidela Salisbury MD   On: 12/27/2020 22:28    (Echo, Carotid, EGD, Colonoscopy, ERCP)    Subjective:  Had flatus no bms...feels better... Discharge Exam: Vitals:   12/30/20 2031 12/31/20 0609  BP: 115/73 132/80  Pulse: 70 66  Resp: 18 18  Temp: 98.2 F (36.8 C) 97.7 F (36.5 C)  SpO2: 93% 95%   Vitals:   12/30/20 0529 12/30/20 1333 12/30/20 2031 12/31/20 0609  BP: 97/64 106/67 115/73 132/80  Pulse: 73 64 70 66  Resp: 17 18 18 18   Temp: 99.1 F (37.3 C) 97.6 F (36.4 C) 98.2 F (36.8 C) 97.7 F (36.5 C)  TempSrc:  Oral Oral Oral  SpO2: 92% 95% 93% 95%  Weight:      Height:        General: Pt is alert, awake, not in acute distress Cardiovascular: RRR, S1/S2 +, no rubs, no gallops Respiratory: CTA bilaterally, no wheezing, no rhonchi Abdominal: Soft, NT, ND, bowel sounds + Extremities: no  edema, no cyanosis    The results of significant diagnostics from this hospitalization (including imaging, microbiology, ancillary and laboratory) are listed below for reference.     Microbiology: Recent Results (from the past 240 hour(s))  Resp Panel by RT-PCR (Flu A&B, Covid) Nasopharyngeal Swab     Status: None   Collection Time: 12/27/20 10:59 PM   Specimen: Nasopharyngeal Swab; Nasopharyngeal(NP) swabs in vial transport  medium  Result Value Ref Range Status   SARS Coronavirus 2 by RT PCR NEGATIVE NEGATIVE Final    Comment: (NOTE) SARS-CoV-2 target nucleic acids are NOT DETECTED.  The SARS-CoV-2 RNA is generally detectable in upper respiratory specimens during the acute phase of infection. The lowest concentration of SARS-CoV-2 viral copies this assay can detect is 138 copies/mL. A negative result does not preclude SARS-Cov-2 infection and should not be used as the sole basis for treatment or other patient management decisions. A negative result may occur with  improper specimen collection/handling, submission of specimen other than nasopharyngeal swab, presence of viral mutation(s) within the areas targeted by this assay, and inadequate number of viral copies(<138 copies/mL). A negative result must be combined with clinical observations, patient history, and epidemiological information. The expected result is Negative.  Fact Sheet for Patients:  EntrepreneurPulse.com.au  Fact Sheet for Healthcare Providers:  IncredibleEmployment.be  This test is no t yet approved or cleared by the [place] FDA and  has been authorized for detection and/or diagnosis of SARS-CoV-2 by FDA under an Emergency Use Authorization (EUA). This EUA will remain  in effect (meaning this test can be used) for the duration of the COVID-19 declaration under Section 564(b)(1) of the Act, 21 U.S.C.section 360bbb-3(b)(1), unless the authorization is terminated  or revoked sooner.       Influenza A by PCR NEGATIVE NEGATIVE Final   Influenza B by PCR NEGATIVE NEGATIVE Final    Comment: (NOTE) The Xpert Xpress SARS-CoV-2/FLU/RSV plus assay is intended as an aid in the diagnosis of influenza from Nasopharyngeal swab specimens and should not be used as a sole basis for treatment. Nasal washings and aspirates are unacceptable for Xpert Xpress SARS-CoV-2/FLU/RSV testing.  Fact Sheet for  Patients: EntrepreneurPulse.com.au  Fact Sheet for Healthcare Providers: IncredibleEmployment.be  This test is not yet approved or cleared by the [place] FDA and has been authorized for detection and/or diagnosis of SARS-CoV-2 by FDA under an Emergency Use Authorization (EUA). This EUA will remain in effect (meaning this test can be used) for the duration of the COVID-19 declaration under Section 564(b)(1) of the Act, 21 U.S.C. section 360bbb-3(b)(1), unless the authorization is terminated or revoked.  Performed at [facility], [address], [place], [place] [address]   Surgical pcr screen     Status: Abnormal   Collection Time: 12/28/20  8:42 AM   Specimen: Nasal Mucosa; Nasal Swab  Result Value Ref Range Status   MRSA, PCR NEGATIVE NEGATIVE Final   Staphylococcus aureus POSITIVE (A) NEGATIVE Final    Comment: (NOTE) The Xpert SA Assay (FDA approved for NASAL specimens in patients 30 years of age and older), is one component of a comprehensive surveillance program. It is not intended to diagnose infection nor to guide or monitor treatment. Performed at [facility], [place] [address], [place], [place] [address]      Labs: BNP (last 3 results) No results for input(s): BNP in the last 8760 hours. Basic Metabolic Panel: Recent Labs  Lab 12/27/20 2135 12/28/20 0400 12/29/20 0923 12/30/20  1434  NA 134* 137 137 137  K 4.2 4.6 4.0 4.2  CL 101 103 103 102  CO2 24 28 27 28   GLUCOSE 136* 120* 96 107*  BUN 17 18 13 12   CREATININE 1.04 1.03 1.06 1.03  CALCIUM 9.3 9.1 8.9 8.9   Liver Function Tests: Recent Labs  Lab 12/27/20 2135 12/28/20 0400 12/30/20 1434  AST 26 22 28   ALT 17 17 14   ALKPHOS 90 74 72  BILITOT 0.9 0.7 0.2*  PROT 7.5 6.4* 6.5  ALBUMIN 3.8 3.5 3.2*   Recent Labs  Lab 12/27/20 2135  LIPASE 26   No results for input(s): AMMONIA in the last 168 hours. CBC: Recent Labs  Lab  12/27/20 2135 12/28/20 0400 12/29/20 0923 12/30/20 1434  WBC 7.0 6.5 7.5 7.5  NEUTROABS 4.3 3.2  --   --   HGB 14.7 13.1 12.1* 11.5*  HCT 43.2 40.0 36.9* 35.5*  MCV 94.1 97.6 97.1 97.8  PLT 203 187 176 176   Cardiac Enzymes: No results for input(s): CKTOTAL, CKMB, CKMBINDEX, TROPONINI in the last 168 hours. BNP: Invalid input(s): POCBNP CBG: No results for input(s): GLUCAP in the last 168 hours. D-Dimer No results for input(s): DDIMER in the last 72 hours. Hgb A1c No results for input(s): HGBA1C in the last 72 hours. Lipid Profile No results for input(s): CHOL, HDL, LDLCALC, TRIG, CHOLHDL, LDLDIRECT in the last 72 hours. Thyroid function studies No results for input(s): TSH, T4TOTAL, T3FREE, THYROIDAB in the last 72 hours.  Invalid input(s): FREET3 Anemia work up No results for input(s): VITAMINB12, FOLATE, FERRITIN, TIBC, IRON, RETICCTPCT in the last 72 hours. Urinalysis    Component Value Date/Time   COLORURINE YELLOW 12/28/2020 0842   APPEARANCEUR CLEAR 12/28/2020 0842   LABSPEC 1.013 12/28/2020 0842   PHURINE 6.0 12/28/2020 0842   GLUCOSEU NEGATIVE 12/28/2020 0842   HGBUR SMALL (A) 12/28/2020 0842   BILIRUBINUR NEGATIVE 12/28/2020 0842   KETONESUR NEGATIVE 12/28/2020 0842   PROTEINUR NEGATIVE 12/28/2020 0842   UROBILINOGEN 1.0 06/05/2008 1751   NITRITE NEGATIVE 12/28/2020 0842   LEUKOCYTESUR NEGATIVE 12/28/2020 0842   Sepsis Labs Invalid input(s): PROCALCITONIN,  WBC,  LACTICIDVEN Microbiology Recent Results (from the past 240 hour(s))  Resp Panel by RT-PCR (Flu A&B, Covid) Nasopharyngeal Swab     Status: None   Collection Time: 12/27/20 10:59 PM   Specimen: Nasopharyngeal Swab; Nasopharyngeal(NP) swabs in vial transport medium  Result Value Ref Range Status   SARS Coronavirus 2 by RT PCR NEGATIVE NEGATIVE Final    Comment: (NOTE) SARS-CoV-2 target nucleic acids are NOT DETECTED.  The SARS-CoV-2 RNA is generally detectable in upper respiratory specimens  during the acute phase of infection. The lowest concentration of SARS-CoV-2 viral copies this assay can detect is 138 copies/mL. A negative result does not preclude SARS-Cov-2 infection and should not be used as the sole basis for treatment or other patient management decisions. A negative result may occur with  improper specimen collection/handling, submission of specimen other than nasopharyngeal swab, presence of viral mutation(s) within the areas targeted by this assay, and inadequate number of viral copies(<138 copies/mL). A negative result must be combined with clinical observations, patient history, and epidemiological information. The expected result is Negative.  Fact Sheet for Patients:  EntrepreneurPulse.com.au  Fact Sheet for Healthcare Providers:  IncredibleEmployment.be  This test is no t yet approved or cleared by the [place] FDA and  has been authorized for detection and/or diagnosis of SARS-CoV-2 by FDA under an Emergency Use  Authorization (EUA). This EUA will remain  in effect (meaning this test can be used) for the duration of the COVID-19 declaration under Section 564(b)(1) of the Act, 21 U.S.C.section 360bbb-3(b)(1), unless the authorization is terminated  or revoked sooner.       Influenza A by PCR NEGATIVE NEGATIVE Final   Influenza B by PCR NEGATIVE NEGATIVE Final    Comment: (NOTE) The Xpert Xpress SARS-CoV-2/FLU/RSV plus assay is intended as an aid in the diagnosis of influenza from Nasopharyngeal swab specimens and should not be used as a sole basis for treatment. Nasal washings and aspirates are unacceptable for Xpert Xpress SARS-CoV-2/FLU/RSV testing.  Fact Sheet for Patients: EntrepreneurPulse.com.au  Fact Sheet for Healthcare Providers: IncredibleEmployment.be  This test is not yet approved or cleared by the [place] FDA and has been authorized for detection  and/or diagnosis of SARS-CoV-2 by FDA under an Emergency Use Authorization (EUA). This EUA will remain in effect (meaning this test can be used) for the duration of the COVID-19 declaration under Section 564(b)(1) of the Act, 21 U.S.C. section 360bbb-3(b)(1), unless the authorization is terminated or revoked.  Performed at [facility], [address], [place], [place] [address]   Surgical pcr screen     Status: Abnormal   Collection Time: 12/28/20  8:42 AM   Specimen: Nasal Mucosa; Nasal Swab  Result Value Ref Range Status   MRSA, PCR NEGATIVE NEGATIVE Final   Staphylococcus aureus POSITIVE (A) NEGATIVE Final    Comment: (NOTE) The Xpert SA Assay (FDA approved for NASAL specimens in patients 33 years of age and older), is one component of a comprehensive surveillance program. It is not intended to diagnose infection nor to guide or monitor treatment. Performed at [facility], [place] [address], [place], [place] [address]      Time coordinating discharge:  38 minutes  SIGNED:   Georgette Shell, MD  [facility] 12/31/2020, 10:52 AM

## 2020-12-31 NOTE — Progress Notes (Signed)
     Assessment & Plan: POD#3  Complex ventral hernia with SBO S/P laparoscopic repair of incisional recurrent incarcerated abdominal wall hernia with mesh, LOA, bilateral TAP block 12/28/20 Dr. Johney Maine - patient tolerating soft diet - added fiber and bowel regimen  - continue to mobilize, abdominal binder when up - OK for discharge home today from surgical standpoint   Interstitial lung disease/Hx tobacco use GERD Depression Hyperlipidemia PVD with prior right to left femorofemoral bypass graft 01/13/2019 Left radical orchiectomy 01/12/2019 CKD Chronic constipation  NIO:EVOJ diet, SLIV VTE: SCDs, lovenox ID: [id number] 5/18 pre-op        Armandina Gemma, MD       [facility]       Office: [phone]   Chief Complaint: Recurrent ventral incisional hernia  Subjective: Patient in bed, comfortable.  Tolerating diet.  Wanting to have a BM.  Objective: Vital signs in last 24 hours: Temp:  [97.6 F (36.4 C)-98.2 F (36.8 C)] 97.7 F (36.5 C) (05/21 0609) Pulse Rate:  [64-70] 66 (05/21 0609) Resp:  [18] 18 (05/21 0609) BP: (106-132)/(67-80) 132/80 (05/21 0609) SpO2:  [93 %-95 %] 95 % (05/21 0609) Last BM Date: 12/26/20  Intake/Output from previous day: 05/20 0701 - 05/21 0700 In: 3149.2 [P.O.:1800; I.V.:1133.9; IV Piggyback:215.3] Out: 6900 [Urine:6900] Intake/Output this shift: No intake/output data recorded.  Physical Exam: HEENT - sclerae clear, mucous membranes moist Neck - soft Abdomen - soft without distension; wounds dry and intact; binder replaced Ext - no edema, non-tender Neuro - alert & oriented, no focal deficits  Lab Results:  Recent Labs    12/29/20 0923 12/30/20 1434  WBC 7.5 7.5  HGB 12.1* 11.5*  HCT 36.9* 35.5*  PLT 176 176   BMET Recent Labs    12/29/20 0923 12/30/20 1434  NA 137 137  K 4.0 4.2  CL 103 102  CO2 27 28  GLUCOSE 96 107*  BUN 13 12  CREATININE 1.06 1.03  CALCIUM 8.9 8.9   PT/INR No results for  input(s): LABPROT, INR in the last 72 hours. Comprehensive Metabolic Panel:    Component Value Date/Time   NA 137 12/30/2020 1434   NA 137 12/29/2020 0923   K 4.2 12/30/2020 1434   K 4.0 12/29/2020 0923   CL 102 12/30/2020 1434   CL 103 12/29/2020 0923   CO2 28 12/30/2020 1434   CO2 27 12/29/2020 0923   BUN 12 12/30/2020 1434   BUN 13 12/29/2020 0923   CREATININE 1.03 12/30/2020 1434   CREATININE 1.06 12/29/2020 0923   GLUCOSE 107 (H) 12/30/2020 1434   GLUCOSE 96 12/29/2020 0923   CALCIUM 8.9 12/30/2020 1434   CALCIUM 8.9 12/29/2020 0923   AST 28 12/30/2020 1434   AST 22 12/28/2020 0400   ALT 14 12/30/2020 1434   ALT 17 12/28/2020 0400   ALKPHOS 72 12/30/2020 1434   ALKPHOS 74 12/28/2020 0400   BILITOT 0.2 (L) 12/30/2020 1434   BILITOT 0.7 12/28/2020 0400   PROT 6.5 12/30/2020 1434   PROT 6.4 (L) 12/28/2020 0400   ALBUMIN 3.2 (L) 12/30/2020 1434   ALBUMIN 3.5 12/28/2020 0400    Studies/Results: No results found.    Armandina Gemma 12/31/2020  Patient ID: [name], male   DOB: 01/18/1946, 75 y.o.   MRN: [id number]

## 2020-12-31 NOTE — Progress Notes (Signed)
PT Cancellation Note  Patient Details Name: [name] MRN: [id number] DOB: Aug 03, 1946   Cancelled Treatment:     Pt up in halls ambulating IND and with noted improvement in stability.  Pt reports feeling better and much steadier on feet.  PT will dc services at this time.   Nicky Kras 12/31/2020, 2:28 PM

## 2021-01-01 ENCOUNTER — Inpatient Hospital Stay (HOSPITAL_COMMUNITY): Payer: Medicare Other

## 2021-01-01 DIAGNOSIS — K430 Incisional hernia with obstruction, without gangrene: Secondary | ICD-10-CM | POA: Diagnosis not present

## 2021-01-01 MED ORDER — SORBITOL 70 % SOLN
200.0000 mL | TOPICAL_OIL | Freq: Once | ORAL | Status: AC
  Administered 2021-01-01: 200 mL via RECTAL
  Filled 2021-01-01: qty 60

## 2021-01-01 MED ORDER — SODIUM CHLORIDE 0.9 % IV SOLN: INTRAVENOUS | Status: DC

## 2021-01-01 MED ORDER — BISACODYL 10 MG RE SUPP
10.0000 mg | Freq: Once | RECTAL | Status: AC
  Administered 2021-01-01: 10 mg via RECTAL
  Filled 2021-01-01: qty 1

## 2021-01-01 NOTE — Progress Notes (Signed)
PROGRESS NOTE    [name]  XTG:[id number] DOB: [birth year] DOA: 12/27/2020 PCP: Deland Pretty, MD   Brief Narrative: 75 year old male admitted with small bowel obstruction with loop of bowel inside complicated ventral hernia status postrepair by Dr. Johney Maine 11/28/2020.  Assessment & Plan:   Principal Problem:   Incarcerated incisional hernia with SBO Active Problems:   Allergic rhinitis   SBO (small bowel obstruction) (HCC)   Gastroesophageal reflux disease without esophagitis   Generalized anxiety disorder   Hyperlipidemia   Incarcerated ventral hernia   Recurrent ventral incisional hernia  #1 status post laparoscopic repair of complex ventral hernia with small bowel obstruction Dr. Johney Maine 12/28/2020.  Patient has not had moved his bowels or have flatus.  Abdomen distended.  Encourage him to get out of bed today and ambulate.  Minimize narcotics.  #2 hyperlipidemia on Crestor   #3 anxiety/depression on Zoloft   #4 hypotension his blood pressure is very soft and he was symptomatic with dizziness while walking.  I will start him on normal saline 100 cc/h for 24 hours.  Hold any BP meds.    Estimated body mass index is 29.84 kg/m as calculated from the following:   Height as of this encounter: 6' (1.829 m).   Weight as of this encounter: 99.8 kg.  DVT prophylaxis: Lovenox Code Status: Full code Family Communication: None at bedside  disposition Plan:  Status is: Inpatient  Dispo: The patient is from: Home              Anticipated d/c is to: Home              Patient currently is not medically stable to d/c.   Difficult to place patient No    Consultants: General surgery  Procedures: Ventral hernia repair 12/28/2020 Antimicrobials: None  Subjective: Patient is resting in bed he still has not had a bowel movement abdomen is distended and painful He reports ambulating yesterday in the hallway but felt lightheaded.  He has a soft blood pressure.     Objective: Vitals:   12/31/20 0609 12/31/20 1402 12/31/20 2025 01/01/21 0623  BP: 132/80 126/79 107/67 120/80  Pulse: 66 69 70 64  Resp: 18 18 18 16   Temp: 97.7 F (36.5 C) 97.9 F (36.6 C) 98.7 F (37.1 C) 98.2 F (36.8 C)  TempSrc: Oral Oral Oral Oral  SpO2: 95% 94% 93% 93%  Weight:      Height:        Intake/Output Summary (Last 24 hours) at 01/01/2021 1013 Last data filed at 12/31/2020 2100 Gross per 24 hour  Intake 1284.65 ml  Output 0 ml  Net 1284.65 ml   Filed Weights   12/27/20 2023  Weight: 99.8 kg    Examination:  General exam: Appears calm and comfortable  Respiratory system: Clear to auscultation. Respiratory effort normal. Cardiovascular system: S1 & S2 heard, RRR. No JVD, murmurs, rubs, gallops or clicks. No pedal edema. Gastrointestinal system: Abdomen is distended, soft and appropriately tender. No organomegaly or masses felt. Normal bowel sounds heard. Central nervous system: Alert and oriented. No focal neurological deficits. Extremities: Symmetric 5 x 5 power. Skin: No rashes, lesions or ulcers Psychiatry: Judgement and insight appear normal. Mood & affect appropriate.     Data Reviewed: I have personally reviewed following labs and imaging studies  CBC: Recent Labs  Lab 12/27/20 2135 12/28/20 0400 12/29/20 0923 12/30/20 1434  WBC 7.0 6.5 7.5 7.5  NEUTROABS 4.3 3.2  --   --  HGB 14.7 13.1 12.1* 11.5*  HCT 43.2 40.0 36.9* 35.5*  MCV 94.1 97.6 97.1 97.8  PLT 203 187 176 951   Basic Metabolic Panel: Recent Labs  Lab 12/27/20 2135 12/28/20 0400 12/29/20 0923 12/30/20 1434  NA 134* 137 137 137  K 4.2 4.6 4.0 4.2  CL 101 103 103 102  CO2 24 28 27 28   GLUCOSE 136* 120* 96 107*  BUN 17 18 13 12   CREATININE 1.04 1.03 1.06 1.03  CALCIUM 9.3 9.1 8.9 8.9   GFR: Estimated Creatinine Clearance: 74.6 mL/min (by C-G formula based on SCr of 1.03 mg/dL). Liver Function Tests: Recent Labs  Lab 12/27/20 2135 12/28/20 0400  12/30/20 1434  AST 26 22 28   ALT 17 17 14   ALKPHOS 90 74 72  BILITOT 0.9 0.7 0.2*  PROT 7.5 6.4* 6.5  ALBUMIN 3.8 3.5 3.2*   Recent Labs  Lab 12/27/20 2135  LIPASE 26   No results for input(s): AMMONIA in the last 168 hours. Coagulation Profile: No results for input(s): INR, PROTIME in the last 168 hours. Cardiac Enzymes: No results for input(s): CKTOTAL, CKMB, CKMBINDEX, TROPONINI in the last 168 hours. BNP (last 3 results) No results for input(s): PROBNP in the last 8760 hours. HbA1C: No results for input(s): HGBA1C in the last 72 hours. CBG: No results for input(s): GLUCAP in the last 168 hours. Lipid Profile: No results for input(s): CHOL, HDL, LDLCALC, TRIG, CHOLHDL, LDLDIRECT in the last 72 hours. Thyroid Function Tests: No results for input(s): TSH, T4TOTAL, FREET4, T3FREE, THYROIDAB in the last 72 hours. Anemia Panel: No results for input(s): VITAMINB12, FOLATE, FERRITIN, TIBC, IRON, RETICCTPCT in the last 72 hours. Sepsis Labs: No results for input(s): PROCALCITON, LATICACIDVEN in the last 168 hours.  Recent Results (from the past 240 hour(s))  Resp Panel by RT-PCR (Flu A&B, Covid) Nasopharyngeal Swab     Status: None   Collection Time: 12/27/20 10:59 PM   Specimen: Nasopharyngeal Swab; Nasopharyngeal(NP) swabs in vial transport medium  Result Value Ref Range Status   SARS Coronavirus 2 by RT PCR NEGATIVE NEGATIVE Final    Comment: (NOTE) SARS-CoV-2 target nucleic acids are NOT DETECTED.  The SARS-CoV-2 RNA is generally detectable in upper respiratory specimens during the acute phase of infection. The lowest concentration of SARS-CoV-2 viral copies this assay can detect is 138 copies/mL. A negative result does not preclude SARS-Cov-2 infection and should not be used as the sole basis for treatment or other patient management decisions. A negative result may occur with  improper specimen collection/handling, submission of specimen other than nasopharyngeal  swab, presence of viral mutation(s) within the areas targeted by this assay, and inadequate number of viral copies(<138 copies/mL). A negative result must be combined with clinical observations, patient history, and epidemiological information. The expected result is Negative.  Fact Sheet for Patients:  EntrepreneurPulse.com.au  Fact Sheet for Healthcare Providers:  IncredibleEmployment.be  This test is no t yet approved or cleared by the [place] FDA and  has been authorized for detection and/or diagnosis of SARS-CoV-2 by FDA under an Emergency Use Authorization (EUA). This EUA will remain  in effect (meaning this test can be used) for the duration of the COVID-19 declaration under Section 564(b)(1) of the Act, 21 U.S.C.section 360bbb-3(b)(1), unless the authorization is terminated  or revoked sooner.       Influenza A by PCR NEGATIVE NEGATIVE Final   Influenza B by PCR NEGATIVE NEGATIVE Final    Comment: (NOTE) The Xpert Xpress SARS-CoV-2/FLU/RSV plus  assay is intended as an aid in the diagnosis of influenza from Nasopharyngeal swab specimens and should not be used as a sole basis for treatment. Nasal washings and aspirates are unacceptable for Xpert Xpress SARS-CoV-2/FLU/RSV testing.  Fact Sheet for Patients: EntrepreneurPulse.com.au  Fact Sheet for Healthcare Providers: IncredibleEmployment.be  This test is not yet approved or cleared by the [place] FDA and has been authorized for detection and/or diagnosis of SARS-CoV-2 by FDA under an Emergency Use Authorization (EUA). This EUA will remain in effect (meaning this test can be used) for the duration of the COVID-19 declaration under Section 564(b)(1) of the Act, 21 U.S.C. section 360bbb-3(b)(1), unless the authorization is terminated or revoked.  Performed at [facility], [address] [place], [place] [address]    Surgical pcr screen     Status: Abnormal   Collection Time: 12/28/20  8:42 AM   Specimen: Nasal Mucosa; Nasal Swab  Result Value Ref Range Status   MRSA, PCR NEGATIVE NEGATIVE Final   Staphylococcus aureus POSITIVE (A) NEGATIVE Final    Comment: (NOTE) The Xpert SA Assay (FDA approved for NASAL specimens in patients 63 years of age and older), is one component of a comprehensive surveillance program. It is not intended to diagnose infection nor to guide or monitor treatment. Performed at [facility], [place] [address], [place], [place] [address]          Radiology Studies: No results found.      Scheduled Meds: . acetaminophen  1,000 mg Oral Q6H  . aspirin EC  81 mg Oral Daily  . Chlorhexidine Gluconate Cloth  6 each Topical Q0600  . docusate sodium  100 mg Oral BID  . lip balm  1 application Topical BID  . loratadine  10 mg Oral Daily  . mupirocin ointment  1 application Nasal BID  . pantoprazole  40 mg Oral Q1200  . polycarbophil  625 mg Oral BID  . polyethylene glycol  17 g Oral Daily  . rosuvastatin  10 mg Oral Daily  . sodium chloride flush  3 mL Intravenous Q12H   Continuous Infusions: . sodium chloride    . methocarbamol (ROBAXIN) IV 500 mg (01/01/21 0826)  . ondansetron (ZOFRAN) IV       LOS: 4 days     Georgette Shell, MD 01/01/2021, 10:13 AM

## 2021-01-01 NOTE — Progress Notes (Signed)
PROGRESS NOTE    [name]  IOE:[id number] DOB: 06-12-1946 DOA: 12/27/2020 PCP: Deland Pretty, MD   Brief Narrative: 75 year old male admitted with small bowel obstruction with loop of bowel inside complicated ventral hernia status postrepair by Dr. Johney Maine 11/28/2020.  Assessment & Plan:   Principal Problem:   Incarcerated incisional hernia with SBO Active Problems:   Allergic rhinitis   SBO (small bowel obstruction) (HCC)   Gastroesophageal reflux disease without esophagitis   Generalized anxiety disorder   Hyperlipidemia   Incarcerated ventral hernia   Recurrent ventral incisional hernia  #1 status post laparoscopic repair of complex ventral hernia with small bowel obstruction Dr. Johney Maine 12/28/2020.  Patient has not had moved his bowels .  Abdomen distended.  He reports having flatus however he feels very weak since he has not had anything to eat since yesterday.  Encourage him to get out of bed today and ambulate.  Minimize narcotics. Restart IV fluids Clear liquid diet(he was on a soft diet) KUB with partial SBO.  #2 hyperlipidemia on Crestor   #3 anxiety/depression on Zoloft    Estimated body mass index is 29.84 kg/m as calculated from the following:   Height as of this encounter: 6' (1.829 m).   Weight as of this encounter: 99.8 kg.  DVT prophylaxis: Lovenox Code Status: Full code Family Communication: None at bedside  disposition Plan:  Status is: Inpatient  Dispo: The patient is from: Home              Anticipated d/c is to: Home              Patient currently is not medically stable to d/c.   Difficult to place patient No    Consultants: General surgery  Procedures: Ventral hernia repair 12/28/2020 Antimicrobials: None  Subjective: Patient is resting in bed he still has not had a bowel movement abdomen is distended and painful He is having flatus KUB with partial SBO today  Objective: Vitals:   12/31/20 1402 12/31/20 2025 01/01/21 0623 01/01/21  1308  BP: 126/79 107/67 120/80 135/88  Pulse: 69 70 64 79  Resp: 18 18 16 18   Temp: 97.9 F (36.6 C) 98.7 F (37.1 C) 98.2 F (36.8 C) 98.1 F (36.7 C)  TempSrc: Oral Oral Oral Oral  SpO2: 94% 93% 93% 94%  Weight:      Height:        Intake/Output Summary (Last 24 hours) at 01/01/2021 1316 Last data filed at 12/31/2020 2100 Gross per 24 hour  Intake 1284.65 ml  Output 0 ml  Net 1284.65 ml   Filed Weights   12/27/20 2023  Weight: 99.8 kg    Examination:  General exam: Appears calm and comfortable  Respiratory system: Clear to auscultation. Respiratory effort normal. Cardiovascular system: S1 & S2 heard, RRR. No JVD, murmurs, rubs, gallops or clicks. No pedal edema. Gastrointestinal system: Abdomen is distended, soft and appropriately tender. No organomegaly or masses felt.  Diminished bowel sounds heard. Central nervous system: Alert and oriented. No focal neurological deficits. Extremities: Symmetric 5 x 5 power. Skin: No rashes, lesions or ulcers Psychiatry: Judgement and insight appear normal. Mood & affect appropriate.     Data Reviewed: I have personally reviewed following labs and imaging studies  CBC: Recent Labs  Lab 12/27/20 2135 12/28/20 0400 12/29/20 0923 12/30/20 1434  WBC 7.0 6.5 7.5 7.5  NEUTROABS 4.3 3.2  --   --   HGB 14.7 13.1 12.1* 11.5*  HCT 43.2 40.0 36.9* 35.5*  MCV 94.1 97.6 97.1 97.8  PLT 203 187 176 517   Basic Metabolic Panel: Recent Labs  Lab 12/27/20 2135 12/28/20 0400 12/29/20 0923 12/30/20 1434  NA 134* 137 137 137  K 4.2 4.6 4.0 4.2  CL 101 103 103 102  CO2 24 28 27 28   GLUCOSE 136* 120* 96 107*  BUN 17 18 13 12   CREATININE 1.04 1.03 1.06 1.03  CALCIUM 9.3 9.1 8.9 8.9   GFR: Estimated Creatinine Clearance: 74.6 mL/min (by C-G formula based on SCr of 1.03 mg/dL). Liver Function Tests: Recent Labs  Lab 12/27/20 2135 12/28/20 0400 12/30/20 1434  AST 26 22 28   ALT 17 17 14   ALKPHOS 90 74 72  BILITOT 0.9 0.7 0.2*   PROT 7.5 6.4* 6.5  ALBUMIN 3.8 3.5 3.2*   Recent Labs  Lab 12/27/20 2135  LIPASE 26   No results for input(s): AMMONIA in the last 168 hours. Coagulation Profile: No results for input(s): INR, PROTIME in the last 168 hours. Cardiac Enzymes: No results for input(s): CKTOTAL, CKMB, CKMBINDEX, TROPONINI in the last 168 hours. BNP (last 3 results) No results for input(s): PROBNP in the last 8760 hours. HbA1C: No results for input(s): HGBA1C in the last 72 hours. CBG: No results for input(s): GLUCAP in the last 168 hours. Lipid Profile: No results for input(s): CHOL, HDL, LDLCALC, TRIG, CHOLHDL, LDLDIRECT in the last 72 hours. Thyroid Function Tests: No results for input(s): TSH, T4TOTAL, FREET4, T3FREE, THYROIDAB in the last 72 hours. Anemia Panel: No results for input(s): VITAMINB12, FOLATE, FERRITIN, TIBC, IRON, RETICCTPCT in the last 72 hours. Sepsis Labs: No results for input(s): PROCALCITON, LATICACIDVEN in the last 168 hours.  Recent Results (from the past 240 hour(s))  Resp Panel by RT-PCR (Flu A&B, Covid) Nasopharyngeal Swab     Status: None   Collection Time: 12/27/20 10:59 PM   Specimen: Nasopharyngeal Swab; Nasopharyngeal(NP) swabs in vial transport medium  Result Value Ref Range Status   SARS Coronavirus 2 by RT PCR NEGATIVE NEGATIVE Final    Comment: (NOTE) SARS-CoV-2 target nucleic acids are NOT DETECTED.  The SARS-CoV-2 RNA is generally detectable in upper respiratory specimens during the acute phase of infection. The lowest concentration of SARS-CoV-2 viral copies this assay can detect is 138 copies/mL. A negative result does not preclude SARS-Cov-2 infection and should not be used as the sole basis for treatment or other patient management decisions. A negative result may occur with  improper specimen collection/handling, submission of specimen other than nasopharyngeal swab, presence of viral mutation(s) within the areas targeted by this assay, and  inadequate number of viral copies(<138 copies/mL). A negative result must be combined with clinical observations, patient history, and epidemiological information. The expected result is Negative.  Fact Sheet for Patients:  EntrepreneurPulse.com.au  Fact Sheet for Healthcare Providers:  IncredibleEmployment.be  This test is no t yet approved or cleared by the [place] FDA and  has been authorized for detection and/or diagnosis of SARS-CoV-2 by FDA under an Emergency Use Authorization (EUA). This EUA will remain  in effect (meaning this test can be used) for the duration of the COVID-19 declaration under Section 564(b)(1) of the Act, 21 U.S.C.section 360bbb-3(b)(1), unless the authorization is terminated  or revoked sooner.       Influenza A by PCR NEGATIVE NEGATIVE Final   Influenza B by PCR NEGATIVE NEGATIVE Final    Comment: (NOTE) The Xpert Xpress SARS-CoV-2/FLU/RSV plus assay is intended as an aid in the diagnosis of influenza from  Nasopharyngeal swab specimens and should not be used as a sole basis for treatment. Nasal washings and aspirates are unacceptable for Xpert Xpress SARS-CoV-2/FLU/RSV testing.  Fact Sheet for Patients: EntrepreneurPulse.com.au  Fact Sheet for Healthcare Providers: IncredibleEmployment.be  This test is not yet approved or cleared by the [place] FDA and has been authorized for detection and/or diagnosis of SARS-CoV-2 by FDA under an Emergency Use Authorization (EUA). This EUA will remain in effect (meaning this test can be used) for the duration of the COVID-19 declaration under Section 564(b)(1) of the Act, 21 U.S.C. section 360bbb-3(b)(1), unless the authorization is terminated or revoked.  Performed at [facility], [address], [place], [place] [address]   Surgical pcr screen     Status: Abnormal   Collection Time: 12/28/20  8:42 AM    Specimen: Nasal Mucosa; Nasal Swab  Result Value Ref Range Status   MRSA, PCR NEGATIVE NEGATIVE Final   Staphylococcus aureus POSITIVE (A) NEGATIVE Final    Comment: (NOTE) The Xpert SA Assay (FDA approved for NASAL specimens in patients 11 years of age and older), is one component of a comprehensive surveillance program. It is not intended to diagnose infection nor to guide or monitor treatment. Performed at [facility], [place] [address] [place], [place] [address]          Radiology Studies: DG Abd 1 View  Result Date: 01/01/2021 CLINICAL DATA:  Small bowel obstruction. Ventral hernia repair with mesh May 2018 EXAM: ABDOMEN - 1 VIEW COMPARISON:  October 28, 2020 FINDINGS: Mildly prominent and dilated loops of small bowel in the lateral left abdomen are consistent with reported small-bowel obstruction. Dilatation has decreased in the interval. No free air, portal venous gas, or pneumatosis. No other acute abnormalities. IMPRESSION: Continued small bowel obstruction. Gas in the colon suggests a partial obstruction. The amount of dilatation has decreased since Dec 27, 2020. Electronically Signed   By: Dorise Bullion III M.D   On: 01/01/2021 11:54        Scheduled Meds: . acetaminophen  1,000 mg Oral Q6H  . aspirin EC  81 mg Oral Daily  . Chlorhexidine Gluconate Cloth  6 each Topical Q0600  . docusate sodium  100 mg Oral BID  . lip balm  1 application Topical BID  . loratadine  10 mg Oral Daily  . mupirocin ointment  1 application Nasal BID  . pantoprazole  40 mg Oral Q1200  . polycarbophil  625 mg Oral BID  . polyethylene glycol  17 g Oral Daily  . rosuvastatin  10 mg Oral Daily  . sodium chloride flush  3 mL Intravenous Q12H   Continuous Infusions: . sodium chloride    . methocarbamol (ROBAXIN) IV 500 mg (01/01/21 0826)  . ondansetron (ZOFRAN) IV       LOS: 4 days     Georgette Shell, MD 01/01/2021, 1:16 PM

## 2021-01-01 NOTE — Progress Notes (Incomplete)
SMOG enema given.

## 2021-01-02 ENCOUNTER — Inpatient Hospital Stay (HOSPITAL_COMMUNITY): Payer: Medicare Other

## 2021-01-02 DIAGNOSIS — K430 Incisional hernia with obstruction, without gangrene: Secondary | ICD-10-CM | POA: Diagnosis not present

## 2021-01-02 LAB — CBC
HCT: 36.1 % — ABNORMAL LOW (ref 39.0–52.0)
Hemoglobin: 11.9 g/dL — ABNORMAL LOW (ref 13.0–17.0)
MCH: 31.7 pg (ref 26.0–34.0)
MCHC: 33 g/dL (ref 30.0–36.0)
MCV: 96.3 fL (ref 80.0–100.0)
Platelets: 234 10*3/uL (ref 150–400)
RBC: 3.75 MIL/uL — ABNORMAL LOW (ref 4.22–5.81)
RDW: 13.1 % (ref 11.5–15.5)
WBC: 6.1 10*3/uL (ref 4.0–10.5)
nRBC: 0 % (ref 0.0–0.2)

## 2021-01-02 LAB — BASIC METABOLIC PANEL
Anion gap: 7 (ref 5–15)
BUN: 13 mg/dL (ref 8–23)
CO2: 24 mmol/L (ref 22–32)
Calcium: 8.5 mg/dL — ABNORMAL LOW (ref 8.9–10.3)
Chloride: 104 mmol/L (ref 98–111)
Creatinine, Ser: 0.96 mg/dL (ref 0.61–1.24)
GFR, Estimated: >60 mL/min (ref >60)
Glucose, Bld: 88 mg/dL (ref 70–99)
Potassium: 3.7 mmol/L (ref 3.5–5.1)
Sodium: 135 mmol/L (ref 135–145)

## 2021-01-02 LAB — MAGNESIUM: Magnesium: 1.8 mg/dL (ref 1.7–2.4)

## 2021-01-02 MED ORDER — SORBITOL 70 % SOLN
960.0000 mL | TOPICAL_OIL | Freq: Once | ORAL | Status: AC
  Administered 2021-01-02: 960 mL via RECTAL
  Filled 2021-01-02: qty 473

## 2021-01-02 NOTE — Care Management Important Message (Signed)
Important Message  Patient Details IM Letter given to the Patient Name: KEYNAN HEFFERN MRN: [id number] Date of Birth: Dec 18, 1945   Medicare Important Message Given:  Yes     Kerin Salen 01/02/2021, 2:44 PM

## 2021-01-02 NOTE — Progress Notes (Signed)
PROGRESS NOTE    [name]  VQM:[id number] DOB: July 14, 1946 DOA: 12/27/2020 PCP: Deland Pretty, MD   Brief Narrative: 75 year old male admitted with small bowel obstruction with loop of bowel inside complicated ventral hernia status postrepair by Dr. Johney Maine 11/28/2020.  Assessment & Plan:   Principal Problem:   Incarcerated incisional hernia with SBO Active Problems:   Allergic rhinitis   SBO (small bowel obstruction) (HCC)   Gastroesophageal reflux disease without esophagitis   Generalized anxiety disorder   Hyperlipidemia   Incarcerated ventral hernia   Recurrent ventral incisional hernia  #1 status post laparoscopic repair of complex ventral hernia with small bowel obstruction Dr. Johney Maine 12/28/2020.  Patient has not had moved his bowels .  Abdomen distended.  He reports having flatus however he feels very weak since he has not had anything to eat since yesterday.  Encourage him to get out of bed today and ambulate.  Minimize narcotics. Restarted IV fluids Clear liquid diet(he was on a soft diet) KUB -mild persistent small bowel dilatation although improved from the previous day  #2 hyperlipidemia on Crestor   #3 anxiety/depression on Zoloft    Estimated body mass index is 29.84 kg/m as calculated from the following:   Height as of this encounter: 6' (1.829 m).   Weight as of this encounter: 99.8 kg.  DVT prophylaxis: Lovenox Code Status: Full code Family Communication: None at bedside  disposition Plan:  Status is: Inpatient  Dispo: The patient is from: Home              Anticipated d/c is to: Home              Patient currently is not medically stable to d/c.   Difficult to place patient No    Consultants: General surgery  Procedures: Ventral hernia repair 12/28/2020 Antimicrobials: None  Subjective: He is resting in bed Did not walk much yesterday due to feeling dizzy continues to complain of abdominal discomfort having some flatus but did not move  bowels effective MiraLAX Colace and Dulcolax suppository and smog enema Objective: Vitals:   01/01/21 0623 01/01/21 1308 01/01/21 2212 01/02/21 0636  BP: 120/80 135/88 118/73 101/67  Pulse: 64 79 79 76  Resp: 16 18 17 15   Temp: 98.2 F (36.8 C) 98.1 F (36.7 C) 98.2 F (36.8 C) 98.4 F (36.9 C)  TempSrc: Oral Oral Oral Oral  SpO2: 93% 94% 93% 95%  Weight:      Height:        Intake/Output Summary (Last 24 hours) at 01/02/2021 1330 Last data filed at 01/02/2021 1000 Gross per 24 hour  Intake 2795.3 ml  Output 0 ml  Net 2795.3 ml   Filed Weights   12/27/20 2023  Weight: 99.8 kg    Examination:  General exam: Appears calm and comfortable  Respiratory system: Clear to auscultation. Respiratory effort normal. Cardiovascular system: S1 & S2 heard, RRR. No JVD, murmurs, rubs, gallops or clicks. No pedal edema. Gastrointestinal system: Abdomen is distended, soft and appropriately tender. No organomegaly or masses felt.  Diminished bowel sounds heard. Central nervous system: Alert and oriented. No focal neurological deficits. Extremities: Symmetric 5 x 5 power. Skin: No rashes, lesions or ulcers Psychiatry: Judgement and insight appear normal. Mood & affect appropriate.     Data Reviewed: I have personally reviewed following labs and imaging studies  CBC: Recent Labs  Lab 12/27/20 2135 12/28/20 0400 12/29/20 0923 12/30/20 1434 01/02/21 1130  WBC 7.0 6.5 7.5 7.5 6.1  NEUTROABS  4.3 3.2  --   --   --   HGB 14.7 13.1 12.1* 11.5* 11.9*  HCT 43.2 40.0 36.9* 35.5* 36.1*  MCV 94.1 97.6 97.1 97.8 96.3  PLT 203 187 176 176 960   Basic Metabolic Panel: Recent Labs  Lab 12/27/20 2135 12/28/20 0400 12/29/20 0923 12/30/20 1434 01/02/21 1130  NA 134* 137 137 137 135  K 4.2 4.6 4.0 4.2 3.7  CL 101 103 103 102 104  CO2 24 28 27 28 24   GLUCOSE 136* 120* 96 107* 88  BUN 17 18 13 12 13   CREATININE 1.04 1.03 1.06 1.03 0.96  CALCIUM 9.3 9.1 8.9 8.9 8.5*  MG  --   --   --    --  1.8   GFR: Estimated Creatinine Clearance: 80.1 mL/min (by C-G formula based on SCr of 0.96 mg/dL). Liver Function Tests: Recent Labs  Lab 12/27/20 2135 12/28/20 0400 12/30/20 1434  AST 26 22 28   ALT 17 17 14   ALKPHOS 90 74 72  BILITOT 0.9 0.7 0.2*  PROT 7.5 6.4* 6.5  ALBUMIN 3.8 3.5 3.2*   Recent Labs  Lab 12/27/20 2135  LIPASE 26   No results for input(s): AMMONIA in the last 168 hours. Coagulation Profile: No results for input(s): INR, PROTIME in the last 168 hours. Cardiac Enzymes: No results for input(s): CKTOTAL, CKMB, CKMBINDEX, TROPONINI in the last 168 hours. BNP (last 3 results) No results for input(s): PROBNP in the last 8760 hours. HbA1C: No results for input(s): HGBA1C in the last 72 hours. CBG: No results for input(s): GLUCAP in the last 168 hours. Lipid Profile: No results for input(s): CHOL, HDL, LDLCALC, TRIG, CHOLHDL, LDLDIRECT in the last 72 hours. Thyroid Function Tests: No results for input(s): TSH, T4TOTAL, FREET4, T3FREE, THYROIDAB in the last 72 hours. Anemia Panel: No results for input(s): VITAMINB12, FOLATE, FERRITIN, TIBC, IRON, RETICCTPCT in the last 72 hours. Sepsis Labs: No results for input(s): PROCALCITON, LATICACIDVEN in the last 168 hours.  Recent Results (from the past 240 hour(s))  Resp Panel by RT-PCR (Flu A&B, Covid) Nasopharyngeal Swab     Status: None   Collection Time: 12/27/20 10:59 PM   Specimen: Nasopharyngeal Swab; Nasopharyngeal(NP) swabs in vial transport medium  Result Value Ref Range Status   SARS Coronavirus 2 by RT PCR NEGATIVE NEGATIVE Final    Comment: (NOTE) SARS-CoV-2 target nucleic acids are NOT DETECTED.  The SARS-CoV-2 RNA is generally detectable in upper respiratory specimens during the acute phase of infection. The lowest concentration of SARS-CoV-2 viral copies this assay can detect is 138 copies/mL. A negative result does not preclude SARS-Cov-2 infection and should not be used as the sole basis  for treatment or other patient management decisions. A negative result may occur with  improper specimen collection/handling, submission of specimen other than nasopharyngeal swab, presence of viral mutation(s) within the areas targeted by this assay, and inadequate number of viral copies(<138 copies/mL). A negative result must be combined with clinical observations, patient history, and epidemiological information. The expected result is Negative.  Fact Sheet for Patients:  EntrepreneurPulse.com.au  Fact Sheet for Healthcare Providers:  IncredibleEmployment.be  This test is no t yet approved or cleared by the [place] FDA and  has been authorized for detection and/or diagnosis of SARS-CoV-2 by FDA under an Emergency Use Authorization (EUA). This EUA will remain  in effect (meaning this test can be used) for the duration of the COVID-19 declaration under Section 564(b)(1) of the Act, 21 U.S.C.section 360bbb-3(b)(1),  unless the authorization is terminated  or revoked sooner.       Influenza A by PCR NEGATIVE NEGATIVE Final   Influenza B by PCR NEGATIVE NEGATIVE Final    Comment: (NOTE) The Xpert Xpress SARS-CoV-2/FLU/RSV plus assay is intended as an aid in the diagnosis of influenza from Nasopharyngeal swab specimens and should not be used as a sole basis for treatment. Nasal washings and aspirates are unacceptable for Xpert Xpress SARS-CoV-2/FLU/RSV testing.  Fact Sheet for Patients: EntrepreneurPulse.com.au  Fact Sheet for Healthcare Providers: IncredibleEmployment.be  This test is not yet approved or cleared by the [place] FDA and has been authorized for detection and/or diagnosis of SARS-CoV-2 by FDA under an Emergency Use Authorization (EUA). This EUA will remain in effect (meaning this test can be used) for the duration of the COVID-19 declaration under Section 564(b)(1) of the Act, 21  U.S.C. section 360bbb-3(b)(1), unless the authorization is terminated or revoked.  Performed at [facility], [address], [place], [place] [address]   Surgical pcr screen     Status: Abnormal   Collection Time: 12/28/20  8:42 AM   Specimen: Nasal Mucosa; Nasal Swab  Result Value Ref Range Status   MRSA, PCR NEGATIVE NEGATIVE Final   Staphylococcus aureus POSITIVE (A) NEGATIVE Final    Comment: (NOTE) The Xpert SA Assay (FDA approved for NASAL specimens in patients 87 years of age and older), is one component of a comprehensive surveillance program. It is not intended to diagnose infection nor to guide or monitor treatment. Performed at [facility], [place] [address] [place], [place] [address]          Radiology Studies: DG Abd 1 View  Result Date: 01/02/2021 CLINICAL DATA:  Follow up small bowel obstruction EXAM: ABDOMEN - 1 VIEW COMPARISON:  01/01/2021 FINDINGS: Scattered large and small bowel gas is noted. A few loops of mildly prominent small bowel remain in the left mid abdomen. No free air is seen. No bony abnormality is noted. IMPRESSION: Mild persistent small bowel dilatation although improved from the previous day Electronically Signed   By: Inez Catalina M.D.   On: 01/02/2021 09:14   DG Abd 1 View  Result Date: 01/01/2021 CLINICAL DATA:  Small bowel obstruction. Ventral hernia repair with mesh May 2018 EXAM: ABDOMEN - 1 VIEW COMPARISON:  October 28, 2020 FINDINGS: Mildly prominent and dilated loops of small bowel in the lateral left abdomen are consistent with reported small-bowel obstruction. Dilatation has decreased in the interval. No free air, portal venous gas, or pneumatosis. No other acute abnormalities. IMPRESSION: Continued small bowel obstruction. Gas in the colon suggests a partial obstruction. The amount of dilatation has decreased since Dec 27, 2020. Electronically Signed   By: Dorise Bullion III M.D   On: 01/01/2021 11:54         Scheduled Meds: . acetaminophen  1,000 mg Oral Q6H  . aspirin EC  81 mg Oral Daily  . Chlorhexidine Gluconate Cloth  6 each Topical Q0600  . docusate sodium  100 mg Oral BID  . lip balm  1 application Topical BID  . loratadine  10 mg Oral Daily  . pantoprazole  40 mg Oral Q1200  . polyethylene glycol  17 g Oral Daily  . rosuvastatin  10 mg Oral Daily  . sodium chloride flush  3 mL Intravenous Q12H   Continuous Infusions: . sodium chloride    . sodium chloride 125 mL/hr at 01/02/21 0854  . methocarbamol (ROBAXIN)  IV 500 mg (01/02/21 1308)  . ondansetron (ZOFRAN) IV       LOS: 5 days     Georgette Shell, MD 01/02/2021, 1:30 PM

## 2021-01-02 NOTE — Progress Notes (Signed)
[facility] Progress Note  5 Days Post-Op  Subjective: CC-  Abdomen sore but no significant pain or bloating. States that he has been passing some flatus but still hasn't had a BM since surgery despite miralax, colace, dulcolax suppository, and enema. He vomited once with the enema yesterday but no n/v today. Tolerating some clear liquids.  Objective: Vital signs in last 24 hours: Temp:  [98.1 F (36.7 C)-98.4 F (36.9 C)] 98.4 F (36.9 C) (05/23 0636) Pulse Rate:  [76-79] 76 (05/23 0636) Resp:  [15-18] 15 (05/23 0636) BP: (101-135)/(67-88) 101/67 (05/23 0636) SpO2:  [93 %-95 %] 95 % (05/23 0636) Last BM Date: 01/01/21  Intake/Output from previous day: 05/22 0701 - 05/23 0700 In: 2058.1 [I.V.:1764.6; IV Piggyback:293.6] Out: -  Intake/Output this shift: No intake/output data recorded.  PE: Gen:  Alert, NAD, pleasant Pulm: rate and effort normal Abd: soft, nondistended, nontender, +BS, lap incisions cdi with steri strips in place and no erythema or drainage  Lab Results:  Recent Labs    12/30/20 1434  WBC 7.5  HGB 11.5*  HCT 35.5*  PLT 176   BMET Recent Labs    12/30/20 1434  NA 137  K 4.2  CL 102  CO2 28  GLUCOSE 107*  BUN 12  CREATININE 1.03  CALCIUM 8.9   PT/INR No results for input(s): LABPROT, INR in the last 72 hours. CMP     Component Value Date/Time   NA 137 12/30/2020 1434   K 4.2 12/30/2020 1434   CL 102 12/30/2020 1434   CO2 28 12/30/2020 1434   GLUCOSE 107 (H) 12/30/2020 1434   BUN 12 12/30/2020 1434   CREATININE 1.03 12/30/2020 1434   CALCIUM 8.9 12/30/2020 1434   PROT 6.5 12/30/2020 1434   ALBUMIN 3.2 (L) 12/30/2020 1434   AST 28 12/30/2020 1434   ALT 14 12/30/2020 1434   ALKPHOS 72 12/30/2020 1434   BILITOT 0.2 (L) 12/30/2020 1434   GFRNONAA >60 12/30/2020 1434   GFRAA >60 02/02/2019 1520   Lipase     Component Value Date/Time   LIPASE 26 12/27/2020 2135       Studies/Results: DG Abd 1 View  Result  Date: 01/02/2021 CLINICAL DATA:  Follow up small bowel obstruction EXAM: ABDOMEN - 1 VIEW COMPARISON:  01/01/2021 FINDINGS: Scattered large and small bowel gas is noted. A few loops of mildly prominent small bowel remain in the left mid abdomen. No free air is seen. No bony abnormality is noted. IMPRESSION: Mild persistent small bowel dilatation although improved from the previous day Electronically Signed   By: Inez Catalina M.D.   On: 01/02/2021 09:14   DG Abd 1 View  Result Date: 01/01/2021 CLINICAL DATA:  Small bowel obstruction. Ventral hernia repair with mesh May 2018 EXAM: ABDOMEN - 1 VIEW COMPARISON:  October 28, 2020 FINDINGS: Mildly prominent and dilated loops of small bowel in the lateral left abdomen are consistent with reported small-bowel obstruction. Dilatation has decreased in the interval. No free air, portal venous gas, or pneumatosis. No other acute abnormalities. IMPRESSION: Continued small bowel obstruction. Gas in the colon suggests a partial obstruction. The amount of dilatation has decreased since Dec 27, 2020. Electronically Signed   By: Dorise Bullion III M.D   On: 01/01/2021 11:54    Anti-infectives: Anti-infectives (From admission, onward)   Start     Dose/Rate Route Frequency Ordered Stop   12/28/20 2200  ceFAZolin (ANCEF) IVPB 2g/100 mL premix  2 g 200 mL/hr over 30 Minutes Intravenous Every 8 hours 12/28/20 1740 12/29/20 0606   12/28/20 0900  ceFAZolin (ANCEF) IVPB 2g/100 mL premix       "And" Linked Group Details   2 g 200 mL/hr over 30 Minutes Intravenous On call to O.R. 12/28/20 0812 12/28/20 1346   12/28/20 0900  metroNIDAZOLE (FLAGYL) IVPB 500 mg       "And" Linked Group Details   500 mg 100 mL/hr over 60 Minutes Intravenous On call to O.R. 12/28/20 0812 12/28/20 1359       Assessment/Plan Interstitial lung disease/Hx tobacco use GERD Depression Hyperlipidemia PVD with prior right to left femorofemoral bypass graft 01/13/2019 Left radical  orchiectomy 01/12/2019 CKD Chronic constipation  Complex ventral hernia with SBO S/P laparoscopic repair of incisional recurrent incarcerated abdominal wall hernia with mesh, LOA, bilateral TAP block 12/28/20 Dr. Johney Maine - POD#5 - Passing flatus but still has not had a BM. Xray this morning shows gas in colon and decreased small bowel dilatation. Continue clear liquids and bowel regimen (daily miralax/colace), he already has an enema ordered for this morning. Will check some labs. Continue mobilizing. Abdominal binder when up.   FEN:IVF, CLD VTE: SCDs, lovenox ID: Ancef/flagyl 5/18 pre-op   LOS: 5 days    Wellington Hampshire, [facility] 01/02/2021, 10:14 AM Please see Amion for pager number during day hours 7:00am-4:30pm

## 2021-01-03 DIAGNOSIS — K430 Incisional hernia with obstruction, without gangrene: Secondary | ICD-10-CM | POA: Diagnosis not present

## 2021-01-03 LAB — MAGNESIUM: Magnesium: 1.8 mg/dL (ref 1.7–2.4)

## 2021-01-03 LAB — BASIC METABOLIC PANEL
Anion gap: 5 (ref 5–15)
BUN: 9 mg/dL (ref 8–23)
CO2: 27 mmol/L (ref 22–32)
Calcium: 8.5 mg/dL — ABNORMAL LOW (ref 8.9–10.3)
Chloride: 107 mmol/L (ref 98–111)
Creatinine, Ser: 1 mg/dL (ref 0.61–1.24)
GFR, Estimated: >60 mL/min (ref >60)
Glucose, Bld: 96 mg/dL (ref 70–99)
Potassium: 3.9 mmol/L (ref 3.5–5.1)
Sodium: 139 mmol/L (ref 135–145)

## 2021-01-03 LAB — CBC
HCT: 34 % — ABNORMAL LOW (ref 39.0–52.0)
Hemoglobin: 11.2 g/dL — ABNORMAL LOW (ref 13.0–17.0)
MCH: 31.6 pg (ref 26.0–34.0)
MCHC: 32.9 g/dL (ref 30.0–36.0)
MCV: 96 fL (ref 80.0–100.0)
Platelets: 211 10*3/uL (ref 150–400)
RBC: 3.54 MIL/uL — ABNORMAL LOW (ref 4.22–5.81)
RDW: 12.9 % (ref 11.5–15.5)
WBC: 6.7 10*3/uL (ref 4.0–10.5)
nRBC: 0 % (ref 0.0–0.2)

## 2021-01-03 MED ORDER — MAGNESIUM CITRATE PO SOLN
1.0000 | Freq: Once | ORAL | Status: AC
  Administered 2021-01-03: 1 via ORAL
  Filled 2021-01-03: qty 296

## 2021-01-03 NOTE — Progress Notes (Signed)
PROGRESS NOTE    [name]  OZH:[id number] DOB: 03-29-1946 DOA: 12/27/2020 PCP: Deland Pretty, MD   Brief Narrative: 75 year old male admitted with small bowel obstruction with loop of bowel inside complicated ventral hernia status postrepair by Dr. Johney Maine 11/28/2020.  Assessment & Plan:   Principal Problem:   Incarcerated incisional hernia with SBO Active Problems:   Allergic rhinitis   SBO (small bowel obstruction) (HCC)   Gastroesophageal reflux disease without esophagitis   Generalized anxiety disorder   Hyperlipidemia   Incarcerated ventral hernia   Recurrent ventral incisional hernia  #1 status post laparoscopic repair of complex ventral hernia with small bowel obstruction Dr. Johney Maine 12/28/2020.  Patient has not had a good bowel movement in spite of of MiraLAX Dulcolax Colace and smog enema. Surgery following and has ordered mag citrate today. He is tolerating soft diet.  He does have some nausea but no vomiting yet. Encouraged him to get out of bed and increase his activity and walk as much as tolerated. KUB on 01/02/2021-mild persistent small bowel dilatation although improved from the previous day  #2 hyperlipidemia on Crestor   #3 anxiety/depression on Zoloft    Estimated body mass index is 29.84 kg/m as calculated from the following:   Height as of this encounter: 6' (1.829 m).   Weight as of this encounter: 99.8 kg.  DVT prophylaxis: Lovenox Code Status: Full code Family Communication: None at bedside  disposition Plan:  Status is: Inpatient  Dispo: The patient is from: Home              Anticipated d/c is to: Home              Patient currently is not medically stable to d/c.   Difficult to place patient No    Consultants: General surgery  Procedures: Ventral hernia repair 12/28/2020 Antimicrobials: None  Subjective: He is standing up in the room just coming out of the restroom denies dizziness but still has not had a bowel movement.  Able to  pass some gas.  Objective: Vitals:   01/02/21 1346 01/02/21 2007 01/03/21 0546 01/03/21 1320  BP: 128/77 124/72 124/74 122/71  Pulse: 79 66 66 68  Resp: 18 18 18 18   Temp: 98 F (36.7 C) 98.3 F (36.8 C) 98.2 F (36.8 C) 97.8 F (36.6 C)  TempSrc: Oral Oral  Oral  SpO2: 97% 97% 94% 98%  Weight:      Height:        Intake/Output Summary (Last 24 hours) at 01/03/2021 1336 Last data filed at 01/03/2021 1000 Gross per 24 hour  Intake 4371.46 ml  Output 0 ml  Net 4371.46 ml   Filed Weights   12/27/20 2023  Weight: 99.8 kg    Examination:  General exam: Appears calm and comfortable  Respiratory system: Clear to auscultation. Respiratory effort normal. Cardiovascular system: S1 & S2 heard, RRR. No JVD, murmurs, rubs, gallops or clicks. No pedal edema. Gastrointestinal system: Abdomen is distended, soft and appropriately tender. No organomegaly or masses felt.  Diminished bowel sounds heard. Central nervous system: Alert and oriented. No focal neurological deficits. Extremities: Symmetric 5 x 5 power. Skin: No rashes, lesions or ulcers Psychiatry: Judgement and insight appear normal. Mood & affect appropriate.     Data Reviewed: I have personally reviewed following labs and imaging studies  CBC: Recent Labs  Lab 12/27/20 2135 12/28/20 0400 12/29/20 0923 12/30/20 1434 01/02/21 1130 01/03/21 0418  WBC 7.0 6.5 7.5 7.5 6.1 6.7  NEUTROABS 4.3  3.2  --   --   --   --   HGB 14.7 13.1 12.1* 11.5* 11.9* 11.2*  HCT 43.2 40.0 36.9* 35.5* 36.1* 34.0*  MCV 94.1 97.6 97.1 97.8 96.3 96.0  PLT 203 187 176 176 234 893   Basic Metabolic Panel: Recent Labs  Lab 12/28/20 0400 12/29/20 0923 12/30/20 1434 01/02/21 1130 01/03/21 0418  NA 137 137 137 135 139  K 4.6 4.0 4.2 3.7 3.9  CL 103 103 102 104 107  CO2 28 27 28 24 27   GLUCOSE 120* 96 107* 88 96  BUN 18 13 12 13 9   CREATININE 1.03 1.06 1.03 0.96 1.00  CALCIUM 9.1 8.9 8.9 8.5* 8.5*  MG  --   --   --  1.8 1.8    GFR: Estimated Creatinine Clearance: 76.9 mL/min (by C-G formula based on SCr of 1 mg/dL). Liver Function Tests: Recent Labs  Lab 12/27/20 2135 12/28/20 0400 12/30/20 1434  AST 26 22 28   ALT 17 17 14   ALKPHOS 90 74 72  BILITOT 0.9 0.7 0.2*  PROT 7.5 6.4* 6.5  ALBUMIN 3.8 3.5 3.2*   Recent Labs  Lab 12/27/20 2135  LIPASE 26   No results for input(s): AMMONIA in the last 168 hours. Coagulation Profile: No results for input(s): INR, PROTIME in the last 168 hours. Cardiac Enzymes: No results for input(s): CKTOTAL, CKMB, CKMBINDEX, TROPONINI in the last 168 hours. BNP (last 3 results) No results for input(s): PROBNP in the last 8760 hours. HbA1C: No results for input(s): HGBA1C in the last 72 hours. CBG: No results for input(s): GLUCAP in the last 168 hours. Lipid Profile: No results for input(s): CHOL, HDL, LDLCALC, TRIG, CHOLHDL, LDLDIRECT in the last 72 hours. Thyroid Function Tests: No results for input(s): TSH, T4TOTAL, FREET4, T3FREE, THYROIDAB in the last 72 hours. Anemia Panel: No results for input(s): VITAMINB12, FOLATE, FERRITIN, TIBC, IRON, RETICCTPCT in the last 72 hours. Sepsis Labs: No results for input(s): PROCALCITON, LATICACIDVEN in the last 168 hours.  Recent Results (from the past 240 hour(s))  Resp Panel by RT-PCR (Flu A&B, Covid) Nasopharyngeal Swab     Status: None   Collection Time: 12/27/20 10:59 PM   Specimen: Nasopharyngeal Swab; Nasopharyngeal(NP) swabs in vial transport medium  Result Value Ref Range Status   SARS Coronavirus 2 by RT PCR NEGATIVE NEGATIVE Final    Comment: (NOTE) SARS-CoV-2 target nucleic acids are NOT DETECTED.  The SARS-CoV-2 RNA is generally detectable in upper respiratory specimens during the acute phase of infection. The lowest concentration of SARS-CoV-2 viral copies this assay can detect is 138 copies/mL. A negative result does not preclude SARS-Cov-2 infection and should not be used as the sole basis for  treatment or other patient management decisions. A negative result may occur with  improper specimen collection/handling, submission of specimen other than nasopharyngeal swab, presence of viral mutation(s) within the areas targeted by this assay, and inadequate number of viral copies(<138 copies/mL). A negative result must be combined with clinical observations, patient history, and epidemiological information. The expected result is Negative.  Fact Sheet for Patients:  EntrepreneurPulse.com.au  Fact Sheet for Healthcare Providers:  IncredibleEmployment.be  This test is no t yet approved or cleared by the [place] FDA and  has been authorized for detection and/or diagnosis of SARS-CoV-2 by FDA under an Emergency Use Authorization (EUA). This EUA will remain  in effect (meaning this test can be used) for the duration of the COVID-19 declaration under Section 564(b)(1) of the  Act, 21 U.S.C.section 360bbb-3(b)(1), unless the authorization is terminated  or revoked sooner.       Influenza A by PCR NEGATIVE NEGATIVE Final   Influenza B by PCR NEGATIVE NEGATIVE Final    Comment: (NOTE) The Xpert Xpress SARS-CoV-2/FLU/RSV plus assay is intended as an aid in the diagnosis of influenza from Nasopharyngeal swab specimens and should not be used as a sole basis for treatment. Nasal washings and aspirates are unacceptable for Xpert Xpress SARS-CoV-2/FLU/RSV testing.  Fact Sheet for Patients: EntrepreneurPulse.com.au  Fact Sheet for Healthcare Providers: IncredibleEmployment.be  This test is not yet approved or cleared by the [place] FDA and has been authorized for detection and/or diagnosis of SARS-CoV-2 by FDA under an Emergency Use Authorization (EUA). This EUA will remain in effect (meaning this test can be used) for the duration of the COVID-19 declaration under Section 564(b)(1) of the Act, 21  U.S.C. section 360bbb-3(b)(1), unless the authorization is terminated or revoked.  Performed at [facility], [address] [place], [place] [address]   Surgical pcr screen     Status: Abnormal   Collection Time: 12/28/20  8:42 AM   Specimen: Nasal Mucosa; Nasal Swab  Result Value Ref Range Status   MRSA, PCR NEGATIVE NEGATIVE Final   Staphylococcus aureus POSITIVE (A) NEGATIVE Final    Comment: (NOTE) The Xpert SA Assay (FDA approved for NASAL specimens in patients 12 years of age and older), is one component of a comprehensive surveillance program. It is not intended to diagnose infection nor to guide or monitor treatment. Performed at [facility], [place] [address] [place], [place] [address]          Radiology Studies: DG Abd 1 View  Result Date: 01/02/2021 CLINICAL DATA:  Follow up small bowel obstruction EXAM: ABDOMEN - 1 VIEW COMPARISON:  01/01/2021 FINDINGS: Scattered large and small bowel gas is noted. A few loops of mildly prominent small bowel remain in the left mid abdomen. No free air is seen. No bony abnormality is noted. IMPRESSION: Mild persistent small bowel dilatation although improved from the previous day Electronically Signed   By: Inez Catalina M.D.   On: 01/02/2021 09:14        Scheduled Meds: . acetaminophen  1,000 mg Oral Q6H  . aspirin EC  81 mg Oral Daily  . docusate sodium  100 mg Oral BID  . lip balm  1 application Topical BID  . loratadine  10 mg Oral Daily  . pantoprazole  40 mg Oral Q1200  . polyethylene glycol  17 g Oral Daily  . rosuvastatin  10 mg Oral Daily  . sodium chloride flush  3 mL Intravenous Q12H   Continuous Infusions: . sodium chloride    . sodium chloride 125 mL/hr at 01/03/21 0908  . methocarbamol (ROBAXIN) IV 500 mg (01/03/21 1228)  . ondansetron (ZOFRAN) IV       LOS: 6 days     Georgette Shell, MD 01/03/2021, 1:36 PM

## 2021-01-03 NOTE — Progress Notes (Signed)
Progress Note  6 Days Post-Op  Subjective: CC:  Up in chair. States that he felt a little nauseated when he woke up this morning, took some zofran and this resolved. Since then he ate an entire bowl of grits and drank coffee without n/v. Denies any worsening abdominal pain, states that pain has gradually gotten better since surgery. He is passing some flatus. He only had a tiny BM yesterday with enema. WBC 6.7, afebrile  Objective: Vital signs in last 24 hours: Temp:  [98 F (36.7 C)-98.3 F (36.8 C)] 98.2 F (36.8 C) (05/24 0546) Pulse Rate:  [66-79] 66 (05/24 0546) Resp:  [18] 18 (05/24 0546) BP: (124-128)/(72-77) 124/74 (05/24 0546) SpO2:  [94 %-97 %] 94 % (05/24 0546) Last BM Date: 01/01/21  Intake/Output from previous day: 05/23 0701 - 05/24 0700 In: 4266.3 [P.O.:1320; I.V.:2821.2; IV Piggyback:125.1] Out: 0  Intake/Output this shift: No intake/output data recorded.  PE: Gen:  Alert, NAD, pleasant Pulm: rate and effort normal Abd: soft, minimal distension, nontender, +BS, lap incisions cdi with steri strips in place and no erythema or drainage  Lab Results:  Recent Labs    01/02/21 1130 01/03/21 0418  WBC 6.1 6.7  HGB 11.9* 11.2*  HCT 36.1* 34.0*  PLT 234 211   BMET Recent Labs    01/02/21 1130 01/03/21 0418  NA 135 139  K 3.7 3.9  CL 104 107  CO2 24 27  GLUCOSE 88 96  BUN 13 9  CREATININE 0.96 1.00  CALCIUM 8.5* 8.5*   PT/INR No results for input(s): LABPROT, INR in the last 72 hours. CMP     Component Value Date/Time   NA 139 01/03/2021 0418   K 3.9 01/03/2021 0418   CL 107 01/03/2021 0418   CO2 27 01/03/2021 0418   GLUCOSE 96 01/03/2021 0418   BUN 9 01/03/2021 0418   CREATININE 1.00 01/03/2021 0418   CALCIUM 8.5 (L) 01/03/2021 0418   PROT 6.5 12/30/2020 1434   ALBUMIN 3.2 (L) 12/30/2020 1434   AST 28 12/30/2020 1434   ALT 14 12/30/2020 1434   ALKPHOS 72 12/30/2020 1434   BILITOT 0.2 (L) 12/30/2020 1434   GFRNONAA >60 01/03/2021  0418   GFRAA >60 02/02/2019 1520   Lipase     Component Value Date/Time   LIPASE 26 12/27/2020 2135       Studies/Results: DG Abd 1 View  Result Date: 01/02/2021 CLINICAL DATA:  Follow up small bowel obstruction EXAM: ABDOMEN - 1 VIEW COMPARISON:  01/01/2021 FINDINGS: Scattered large and small bowel gas is noted. A few loops of mildly prominent small bowel remain in the left mid abdomen. No free air is seen. No bony abnormality is noted. IMPRESSION: Mild persistent small bowel dilatation although improved from the previous day Electronically Signed   By: Inez Catalina M.D.   On: 01/02/2021 09:14    Anti-infectives: Anti-infectives (From admission, onward)   Start     Dose/Rate Route Frequency Ordered Stop   12/28/20 2200  ceFAZolin (ANCEF) IVPB 2g/100 mL premix        2 g 200 mL/hr over 30 Minutes Intravenous Every 8 hours 12/28/20 1740 12/29/20 0606   12/28/20 0900  ceFAZolin (ANCEF) IVPB 2g/100 mL premix       "And" Linked Group Details   2 g 200 mL/hr over 30 Minutes Intravenous On call to O.R. 12/28/20 1751 12/28/20 1346   12/28/20 0900  metroNIDAZOLE (FLAGYL) IVPB 500 mg       "And" Linked  Group Details   500 mg 100 mL/hr over 60 Minutes Intravenous On call to O.R. 12/28/20 0812 12/28/20 1359       Assessment/Plan  Interstitial lung disease/Hx tobacco use GERD Depression Hyperlipidemia PVD with prior right to left femorofemoral bypass graft 01/13/2019 Left radical orchiectomy 01/12/2019 CKD Chronic constipation  Complex ventral hernia with SBO S/P laparoscopic repair of incisional recurrent incarcerated abdominal wall hernia with mesh, LOA, bilateral TAP block 12/28/20 Dr. Johney Maine - POD#6 - Passing flatus but still has not had a substantial BM. He is tolerating full liquids and does not have any worsening abdominal pain. WBC is WNL and VSS. Will give magnesium citrate today. Continue mobilizing. If he is not opening up may consider CT scan tomorrow, or sooner if he  starts to feel worse.  FEN:IVF, FLD VTE: SCDs, lovenox ID: Ancef/flagyl 5/18 pre-op    LOS: 6 days    Wellington Hampshire, [facility] 01/03/2021, 9:47 AM Please see Amion for pager number during day hours 7:00am-4:30pm

## 2021-01-04 ENCOUNTER — Inpatient Hospital Stay (HOSPITAL_COMMUNITY): Payer: Medicare Other

## 2021-01-04 ENCOUNTER — Encounter (HOSPITAL_COMMUNITY): Payer: Self-pay | Admitting: Internal Medicine

## 2021-01-04 DIAGNOSIS — K56609 Unspecified intestinal obstruction, unspecified as to partial versus complete obstruction: Secondary | ICD-10-CM | POA: Diagnosis not present

## 2021-01-04 DIAGNOSIS — K430 Incisional hernia with obstruction, without gangrene: Secondary | ICD-10-CM | POA: Diagnosis not present

## 2021-01-04 LAB — CBC
HCT: 33.1 % — ABNORMAL LOW (ref 39.0–52.0)
Hemoglobin: 11.2 g/dL — ABNORMAL LOW (ref 13.0–17.0)
MCH: 32.7 pg (ref 26.0–34.0)
MCHC: 33.8 g/dL (ref 30.0–36.0)
MCV: 96.5 fL (ref 80.0–100.0)
Platelets: 236 10*3/uL (ref 150–400)
RBC: 3.43 MIL/uL — ABNORMAL LOW (ref 4.22–5.81)
RDW: 13.3 % (ref 11.5–15.5)
WBC: 6.3 10*3/uL (ref 4.0–10.5)
nRBC: 0 % (ref 0.0–0.2)

## 2021-01-04 LAB — COMPREHENSIVE METABOLIC PANEL
ALT: 35 U/L (ref 0–44)
AST: 53 U/L — ABNORMAL HIGH (ref 15–41)
Albumin: 2.8 g/dL — ABNORMAL LOW (ref 3.5–5.0)
Alkaline Phosphatase: 88 U/L (ref 38–126)
Anion gap: 6 (ref 5–15)
BUN: 6 mg/dL — ABNORMAL LOW (ref 8–23)
CO2: 26 mmol/L (ref 22–32)
Calcium: 8.4 mg/dL — ABNORMAL LOW (ref 8.9–10.3)
Chloride: 108 mmol/L (ref 98–111)
Creatinine, Ser: 0.85 mg/dL (ref 0.61–1.24)
GFR, Estimated: >60 mL/min (ref >60)
Glucose, Bld: 87 mg/dL (ref 70–99)
Potassium: 3.7 mmol/L (ref 3.5–5.1)
Sodium: 140 mmol/L (ref 135–145)
Total Bilirubin: 0.6 mg/dL (ref 0.3–1.2)
Total Protein: 5.7 g/dL — ABNORMAL LOW (ref 6.5–8.1)

## 2021-01-04 LAB — MAGNESIUM: Magnesium: 2.1 mg/dL (ref 1.7–2.4)

## 2021-01-04 MED ORDER — TRAMADOL HCL 50 MG PO TABS: 50.0000 mg | ORAL_TABLET | Freq: Four times a day (QID) | ORAL | 0 refills | Status: DC | PRN

## 2021-01-04 MED ORDER — METHOCARBAMOL 1000 MG/10ML IJ SOLN
500.0000 mg | Freq: Three times a day (TID) | INTRAVENOUS | Status: DC | PRN
  Filled 2021-01-04: qty 5

## 2021-01-04 MED ORDER — IOHEXOL 9 MG/ML PO SOLN
500.0000 mL | ORAL | Status: AC
  Administered 2021-01-04 (×2): 500 mL via ORAL

## 2021-01-04 MED ORDER — IOHEXOL 9 MG/ML PO SOLN
ORAL | Status: AC
  Filled 2021-01-04: qty 1000

## 2021-01-04 NOTE — Hospital Course (Signed)
75 year old man presented with small bowel obstruction complicated by ventral hernia.  He was seen by general surgery and underwent laparoscopic repair of incisional recurrent incarcerated abdominal hernia with mesh.  He improved initially but hospitalization prolonged by slow return of bowel function.    Laparoscopic repair incisional recurrent incarcerated abdominal hernia with mesh, lysis of adhesions  Revealed T11 compression fracture, age-indeterminate T10 compression fracture --Follow-up with PCP as an outpatient  Possible stenosis distal left external iliac artery. --Asymptomatic.  Follow-up with PCP as an outpatient.  Aortic atherosclerosis

## 2021-01-04 NOTE — Progress Notes (Signed)
Discharge instructions given to patient and all questions were answered.  

## 2021-01-04 NOTE — Progress Notes (Signed)
[facility] Progress Note  7 Days Post-Op  Subjective: CC-  Comfortable, no n/v this morning. Continues to pass some flatus. States that he had a couple loose stools yesterday. Tolerating full liquids. He reports some supraumbilical swelling/discomfort.  Objective: Vital signs in last 24 hours: Temp:  [97.8 F (36.6 C)-98.3 F (36.8 C)] 98.3 F (36.8 C) (05/25 0542) Pulse Rate:  [65-68] 65 (05/25 0542) Resp:  [18] 18 (05/25 0542) BP: (109-122)/(68-71) 115/71 (05/25 0542) SpO2:  [95 %-98 %] 95 % (05/25 0542) Last BM Date: 01/01/21  Intake/Output from previous day: 05/24 0701 - 05/25 0700 In: 4561.3 [P.O.:1620; I.V.:2809.3; IV Piggyback:132] Out: 0  Intake/Output this shift: No intake/output data recorded.  PE: Gen: Alert, NAD, pleasant Pulm: rate and effort normal QIH:KVQQ, nondistended, +BS, lap incisions cdi with steri strips in place and no erythema or drainage. Supraumbilical nodule with mild TTP and overlying ecchymosis, abdomen otherwise nontender  Lab Results:  Recent Labs    01/03/21 0418 01/04/21 0432  WBC 6.7 6.3  HGB 11.2* 11.2*  HCT 34.0* 33.1*  PLT 211 236   BMET Recent Labs    01/03/21 0418 01/04/21 0432  NA 139 140  K 3.9 3.7  CL 107 108  CO2 27 26  GLUCOSE 96 87  BUN 9 6*  CREATININE 1.00 0.85  CALCIUM 8.5* 8.4*   PT/INR No results for input(s): LABPROT, INR in the last 72 hours. CMP     Component Value Date/Time   NA 140 01/04/2021 0432   K 3.7 01/04/2021 0432   CL 108 01/04/2021 0432   CO2 26 01/04/2021 0432   GLUCOSE 87 01/04/2021 0432   BUN 6 (L) 01/04/2021 0432   CREATININE 0.85 01/04/2021 0432   CALCIUM 8.4 (L) 01/04/2021 0432   PROT 5.7 (L) 01/04/2021 0432   ALBUMIN 2.8 (L) 01/04/2021 0432   AST 53 (H) 01/04/2021 0432   ALT 35 01/04/2021 0432   ALKPHOS 88 01/04/2021 0432   BILITOT 0.6 01/04/2021 0432   GFRNONAA >60 01/04/2021 0432   GFRAA >60 02/02/2019 1520   Lipase     Component Value Date/Time    LIPASE 26 12/27/2020 2135       Studies/Results: DG Abd 1 View  Result Date: 01/04/2021 CLINICAL DATA:  Abdominal distention. EXAM: ABDOMEN - 1 VIEW COMPARISON:  01/02/2021.  CT 12/27/2020. FINDINGS: Soft tissue structures are unremarkable. Continued improvement of small-bowel distention. Colonic gas pattern is normal. No free air. Degenerative changes scoliosis lumbar spine. IMPRESSION: Continued improvement of small-bowel distention. Electronically Signed   By: Marcello Moores  Register   On: 01/04/2021 05:31    Anti-infectives: Anti-infectives (From admission, onward)   Start     Dose/Rate Route Frequency Ordered Stop   12/28/20 2200  ceFAZolin (ANCEF) IVPB 2g/100 mL premix        2 g 200 mL/hr over 30 Minutes Intravenous Every 8 hours 12/28/20 1740 12/29/20 0606   12/28/20 0900  ceFAZolin (ANCEF) IVPB 2g/100 mL premix       "And" Linked Group Details   2 g 200 mL/hr over 30 Minutes Intravenous On call to O.R. 12/28/20 5956 12/28/20 1346   12/28/20 0900  metroNIDAZOLE (FLAGYL) IVPB 500 mg       "And" Linked Group Details   500 mg 100 mL/hr over 60 Minutes Intravenous On call to O.R. 12/28/20 0812 12/28/20 1359       Assessment/Plan Interstitial lung disease/Hx tobacco use GERD Depression Hyperlipidemia PVD with prior right to left femorofemoral bypass graft 01/13/2019  Left radical orchiectomy 01/12/2019 CKD Chronic constipation  Complex ventral hernia with SBO S/P laparoscopic repair of incisional recurrent incarcerated abdominal wall hernia with mesh, LOA, bilateral TAP block 12/28/20 Dr. Johney Maine - POD#7 - Having bowel function and tolerating diet.  - Will obtain CT scan to evaluate supraumbilical swelling and r/o recurrent hernia vs hematoma/seroma.   FEN:IVF, FLD VTE: SCDs, lovenox ID: Ancef/flagyl 5/18 pre-op    LOS: 7 days    Wellington Hampshire, [facility] 01/04/2021, 10:05 AM Please see Amion for pager number during day hours 7:00am-4:30pm

## 2021-01-04 NOTE — Discharge Summary (Signed)
Physician Discharge Summary  [name] MWN:[id number] DOB: 1945-12-27 DOA: 12/27/2020  PCP: Deland Pretty, MD  Admit date: 12/27/2020 Discharge date: 01/04/2021  Recommendations for Outpatient Follow-up:  1. Follow-up hernia repair 2. Mild elevation AST on discharge, consider repeat testing in the outpatient setting   Follow-up Information    Michael Boston, MD. Go in 3 week(s).   Specialties: General Surgery, Colon and Rectal Surgery Why: Your appointment is 6/13 at 3:00pm Please arrive 30 minutes prior to your appointment to check in and fill out paperwork. Bring photo ID and Doctor, general practice information: [address] [place] [address] [phone]        Deland Pretty, MD Follow up.   Specialty: Internal Medicine Contact information: [address] [place] [address] [phone]                Discharge Diagnoses: Principal diagnosis is #1 Principal Problem:   Incarcerated incisional hernia with SBO Active Problems:   Allergic rhinitis   SBO (small bowel obstruction) (HCC)   Gastroesophageal reflux disease without esophagitis   Generalized anxiety disorder   Hyperlipidemia   Incarcerated ventral hernia   Recurrent ventral incisional hernia   Discharge Condition: improved Disposition: home  Diet recommendation:  Diet Orders (From admission, onward)    Start     Ordered   01/04/21 1529  Diet regular Room service appropriate? Yes; Fluid consistency: Thin  Diet effective now       Question Answer Comment  Room service appropriate? Yes   Fluid consistency: Thin      01/04/21 1528   12/31/20 0000  Diet - low sodium heart healthy        12/31/20 1051           Filed Weights   12/27/20 2023  Weight: 99.8 kg    HPI/Hospital Course:   75 year old man presented with small bowel obstruction complicated by ventral hernia.  Underwent operative repair 5/18.  Bowel function gradually returned.   Hospitalization uncomplicated.  Cleared by general surgery for discharge today by Margie Billet after review of CT scan.  Consults:  . General surgery   Procedures:   S/P laparoscopic repair of incisional recurrent incarcerated abdominal wall hernia with mesh, LOA, bilateral TAP block 12/28/20 Dr. Johney Maine  Today's assessment: S: CC: f/u hernia  Feels good, tolerating diet, ready to go home.  O: Vitals:  Vitals:   01/04/21 0542 01/04/21 1215  BP: 115/71 133/80  Pulse: 65 62  Resp: 18 18  Temp: 98.3 F (36.8 C) 98.1 F (36.7 C)  SpO2: 95% 96%    Constitutional:  . Appears calm and comfortable ENMT:  . grossly normal hearing  Respiratory:  . CTA bilaterally, no w/r/r.  . Respiratory effort normal.  Cardiovascular:  . RRR, no m/r/g . No sig LE extremity edema   Abdomen:  . Abdominal binder on Psychiatric:  . Mental status o Mood, affect appropriate  Basic metabolic panel unremarkable.  AST 53.  Hemoglobin stable 11.2.  CT abdomen pelvis noted.  Discharge Instructions  Discharge Instructions    Diet - low sodium heart healthy   Complete by: As directed    Increase activity slowly   Complete by: As directed      Allergies as of 01/04/2021   No Known Allergies     Medication List    STOP taking these medications   Pfizer-BioNTech COVID-19 Vacc 30 MCG/0.3ML injection Generic drug: COVID-19 mRNA vaccine [employer])  TAKE these medications   acetaminophen 500 MG tablet Commonly known as: TYLENOL Take 500 mg by mouth every 6 (six) hours as needed for headache (pain).   aspirin EC 81 MG tablet Take 81 mg by mouth daily. Swallow whole.   cholecalciferol 25 MCG (1000 UNIT) tablet Commonly known as: VITAMIN D Take 1,000 Units by mouth at bedtime.   loratadine 10 MG tablet Commonly known as: CLARITIN Take 1 tablet (10 mg total) by mouth daily.   omeprazole 10 MG capsule Commonly known as: PRILOSEC Take 10 mg by mouth every morning.   polycarbophil 625  MG tablet Commonly known as: FIBERCON Take 1 tablet (625 mg total) by mouth 2 (two) times daily.   polyethylene glycol 17 g packet Commonly known as: MIRALAX / GLYCOLAX Take 17 g by mouth daily.   rosuvastatin 10 MG tablet Commonly known as: CRESTOR Take 10 mg by mouth daily.   sertraline 50 MG tablet Commonly known as: ZOLOFT Take 50 mg by mouth daily as needed (anxiety).   traMADol 50 MG tablet Commonly known as: ULTRAM Take 1 tablet (50 mg total) by mouth every 6 (six) hours as needed for severe pain.      No Known Allergies  The results of significant diagnostics from this hospitalization (including imaging, microbiology, ancillary and laboratory) are listed below for reference.    Significant Diagnostic Studies: CT ABDOMEN PELVIS WO CONTRAST  Result Date: 01/04/2021 CLINICAL DATA:  75 year old with abdominal distension. Supraumbilical mass. History of laparoscopic ventral hernia repair on 12/28/2020. EXAM: CT ABDOMEN AND PELVIS WITHOUT CONTRAST TECHNIQUE: Multidetector CT imaging of the abdomen and pelvis was performed following the standard protocol without IV contrast. COMPARISON:  CT 12/27/2020 FINDINGS: Lower chest: Paraseptal emphysema at the posterior left lung base. Peripheral lung densities are suggestive for areas of scarring and atelectasis. No pleural effusions. Hepatobiliary: Normal appearance of the liver and gallbladder. Pancreas: Unremarkable. No pancreatic ductal dilatation or surrounding inflammatory changes. Spleen: Normal in size without focal abnormality. Adrenals/Urinary Tract: Normal adrenal glands. Small amount of fluid in the urinary bladder. Difficult to exclude mild urinary bladder wall thickening. Normal appearance of both kidneys without hydronephrosis or stones. Stomach/Bowel: Decreased distension of small bowel since the previous examination. There is oral contrast throughout the small bowel. There is contrast at the terminal ileum and extending into  the right colon. Normal appearance of the stomach. No focal bowel inflammation. Vascular/Lymphatic: Patient has femoral to femoral bypass graft. Aorta and iliac artery calcifications. Negative for an abdominal aortic aneurysm. There is no lymph node enlargement in the abdomen or pelvis. Reproductive: Prostate is unremarkable. Other: Trace high-density fluid in the pelvis on sequence 2 image 75. Again noted is a left inguinal hernia containing fat. Postoperative changes along the anterior abdominal wall from the ventral hernia repair. Residual hernia pockets in the anterior abdominal wall which now contains high-density material. Difficult to exclude residual ventral hernias in this area. New areas of mesenteric or omental stranding in the anterior abdomen on sequence 2 image 37. Soft tissue thickening stranding along the right anterior abdominal wall on sequence 2 image 55. Musculoskeletal: Multilevel degenerative disc and endplate disease in the lumbar spine. Again noted is a compression deformity with sclerosis involving the T10 vertebral body. Stable deformity along the superior endplate of E93. IMPRESSION: 1. Postoperative changes along the anterior abdominal wall compatible with ventral hernia repair. Increased edema or stranding within the anterior abdominal cavity and within the small ventral hernia pockets. Findings are suggestive for postoperative changes  and suspect a small amount of hemorrhage in these areas. 2. Trace high-density fluid in the pelvis is likely related to postoperative changes. No fluid collections within the abdomen or pelvis are suspicious for an abscess. 3. Stable appearance of the left inguinal hernia. 4. Decreased small bowel distension. Oral contrast extending into the right colon. No evidence for a bowel obstruction. Electronically Signed   By: Markus Daft M.D.   On: 01/04/2021 14:28   CT ABDOMEN PELVIS WO CONTRAST  Result Date: 12/27/2020 CLINICAL DATA:  Acute, nonlocalized  abdominal pain and vomiting. EXAM: CT ABDOMEN AND PELVIS WITHOUT CONTRAST TECHNIQUE: Multidetector CT imaging of the abdomen and pelvis was performed following the standard protocol without IV contrast. COMPARISON:  10/26/2020 FINDINGS: Lower chest: Mild bibasilar pulmonary fibrotic change. The visualized heart and pericardium are unremarkable. Hepatobiliary: No focal liver abnormality is seen. No gallstones, gallbladder wall thickening, or biliary dilatation. Pancreas: Unremarkable Spleen: Unremarkable Adrenals/Urinary Tract: Adrenal glands are unremarkable. Kidneys are normal, without renal calculi, focal lesion, or hydronephrosis. Bladder is unremarkable. Stomach/Bowel: A mid small bowel obstruction is present with the transition point representing a single loop of a small-bowel within a complex supraumbilical ventral hernia. This is best seen on axial image # 43 and sagittal image # 58. The proximal small bowel is mildly distended with gas and fluid. The distal small bowel is decompressed. Partial small bowel resection has been performed. There is no free intraperitoneal gas or fluid. The stomach, distal small bowel, and colon are unremarkable. The appendix is unremarkable. Vascular/Lymphatic: Moderate aortoiliac atherosclerotic calcification is present. The distal left external iliac artery appears stenotic, though this is not well assessed on this noncontrast examination. Femoral-femoral bypass grafting has been performed. No pathologic adenopathy within the abdomen and pelvis. Reproductive: Prostate is unremarkable. Other: Complex supraumbilical ventral hernia is present containing at least 3 separate hernia sacs containing mesenteric fat as well as the single loop of small bowel resulting in the small-bowel obstruction described above. Additionally, a a moderate fat containing left inguinal hernia is present with the hernia sac draped around the bypass graft. The rectum is unremarkable. Musculoskeletal: No  lytic or blastic bone lesion. Remote appearing compression fracture of T11 is noted with approximately 20-30% loss of height. An age indeterminate compression fracture of T10 is identified demonstrating 30-40% loss of height. There is no retropulsion associated with either of these fractures. Advanced degenerative changes are noted within the lumbar spine. IMPRESSION: Mid small bowel obstruction secondary to a herniated loop of small bowel within a complex supraumbilical ventral hernia. Complex ventral hernia contains at least 3 separate hernia sacs containing mesenteric fat as well as the point of transition within the mid small bowel. Peripheral vascular disease. Femoral femoral bypass grafting noted. Stenosis of the distal left external iliac artery is suspected, though not well assessed on this noncontrast examination. Remote T11 compression fracture. Age indeterminate T10 compression fracture with 30-40% loss of height. Correlation for point tenderness may be helpful in helping determine acuity. If indicated, this could be better assessed with MRI examination. No associated retropulsion with either of these fractures. Aortic Atherosclerosis (ICD10-I70.0). Electronically Signed   By: Fidela Salisbury MD   On: 12/27/2020 22:28   DG Abd 1 View  Result Date: 01/04/2021 CLINICAL DATA:  Abdominal distention. EXAM: ABDOMEN - 1 VIEW COMPARISON:  01/02/2021.  CT 12/27/2020. FINDINGS: Soft tissue structures are unremarkable. Continued improvement of small-bowel distention. Colonic gas pattern is normal. No free air. Degenerative changes scoliosis lumbar spine. IMPRESSION: Continued improvement  of small-bowel distention. Electronically Signed   By: Marcello Moores  Register   On: 01/04/2021 05:31   DG Abd 1 View  Result Date: 01/02/2021 CLINICAL DATA:  Follow up small bowel obstruction EXAM: ABDOMEN - 1 VIEW COMPARISON:  01/01/2021 FINDINGS: Scattered large and small bowel gas is noted. A few loops of mildly prominent small  bowel remain in the left mid abdomen. No free air is seen. No bony abnormality is noted. IMPRESSION: Mild persistent small bowel dilatation although improved from the previous day Electronically Signed   By: Inez Catalina M.D.   On: 01/02/2021 09:14   DG Abd 1 View  Result Date: 01/01/2021 CLINICAL DATA:  Small bowel obstruction. Ventral hernia repair with mesh May 2018 EXAM: ABDOMEN - 1 VIEW COMPARISON:  October 28, 2020 FINDINGS: Mildly prominent and dilated loops of small bowel in the lateral left abdomen are consistent with reported small-bowel obstruction. Dilatation has decreased in the interval. No free air, portal venous gas, or pneumatosis. No other acute abnormalities. IMPRESSION: Continued small bowel obstruction. Gas in the colon suggests a partial obstruction. The amount of dilatation has decreased since Dec 27, 2020. Electronically Signed   By: Dorise Bullion III M.D   On: 01/01/2021 11:54    Microbiology: Recent Results (from the past 240 hour(s))  Resp Panel by RT-PCR (Flu A&B, Covid) Nasopharyngeal Swab     Status: None   Collection Time: 12/27/20 10:59 PM   Specimen: Nasopharyngeal Swab; Nasopharyngeal(NP) swabs in vial transport medium  Result Value Ref Range Status   SARS Coronavirus 2 by RT PCR NEGATIVE NEGATIVE Final    Comment: (NOTE) SARS-CoV-2 target nucleic acids are NOT DETECTED.  The SARS-CoV-2 RNA is generally detectable in upper respiratory specimens during the acute phase of infection. The lowest concentration of SARS-CoV-2 viral copies this assay can detect is 138 copies/mL. A negative result does not preclude SARS-Cov-2 infection and should not be used as the sole basis for treatment or other patient management decisions. A negative result may occur with  improper specimen collection/handling, submission of specimen other than nasopharyngeal swab, presence of viral mutation(s) within the areas targeted by this assay, and inadequate number of viral copies(<138  copies/mL). A negative result must be combined with clinical observations, patient history, and epidemiological information. The expected result is Negative.  Fact Sheet for Patients:  EntrepreneurPulse.com.au  Fact Sheet for Healthcare Providers:  IncredibleEmployment.be  This test is no t yet approved or cleared by the [place] FDA and  has been authorized for detection and/or diagnosis of SARS-CoV-2 by FDA under an Emergency Use Authorization (EUA). This EUA will remain  in effect (meaning this test can be used) for the duration of the COVID-19 declaration under Section 564(b)(1) of the Act, 21 U.S.C.section 360bbb-3(b)(1), unless the authorization is terminated  or revoked sooner.       Influenza A by PCR NEGATIVE NEGATIVE Final   Influenza B by PCR NEGATIVE NEGATIVE Final    Comment: (NOTE) The Xpert Xpress SARS-CoV-2/FLU/RSV plus assay is intended as an aid in the diagnosis of influenza from Nasopharyngeal swab specimens and should not be used as a sole basis for treatment. Nasal washings and aspirates are unacceptable for Xpert Xpress SARS-CoV-2/FLU/RSV testing.  Fact Sheet for Patients: EntrepreneurPulse.com.au  Fact Sheet for Healthcare Providers: IncredibleEmployment.be  This test is not yet approved or cleared by the [place] FDA and has been authorized for detection and/or diagnosis of SARS-CoV-2 by FDA under an Emergency Use Authorization (EUA). This EUA will remain  in effect (meaning this test can be used) for the duration of the COVID-19 declaration under Section 564(b)(1) of the Act, 21 U.S.C. section 360bbb-3(b)(1), unless the authorization is terminated or revoked.  Performed at [facility], [address], [place], [place] [address]   Surgical pcr screen     Status: Abnormal   Collection Time: 12/28/20  8:42 AM   Specimen: Nasal Mucosa; Nasal Swab  Result  Value Ref Range Status   MRSA, PCR NEGATIVE NEGATIVE Final   Staphylococcus aureus POSITIVE (A) NEGATIVE Final    Comment: (NOTE) The Xpert SA Assay (FDA approved for NASAL specimens in patients 90 years of age and older), is one component of a comprehensive surveillance program. It is not intended to diagnose infection nor to guide or monitor treatment. Performed at [facility], [place] [address] [place], [place] [address]      Labs: Basic Metabolic Panel: Recent Labs  Lab 12/29/20 0923 12/30/20 1434 01/02/21 1130 01/03/21 0418 01/04/21 0432  NA 137 137 135 139 140  K 4.0 4.2 3.7 3.9 3.7  CL 103 102 104 107 108  CO2 27 28 24 27 26   GLUCOSE 96 107* 88 96 87  BUN 13 12 13 9  6*  CREATININE 1.06 1.03 0.96 1.00 0.85  CALCIUM 8.9 8.9 8.5* 8.5* 8.4*  MG  --   --  1.8 1.8 2.1   Liver Function Tests: Recent Labs  Lab 12/30/20 1434 01/04/21 0432  AST 28 53*  ALT 14 35  ALKPHOS 72 88  BILITOT 0.2* 0.6  PROT 6.5 5.7*  ALBUMIN 3.2* 2.8*   CBC: Recent Labs  Lab 12/29/20 0923 12/30/20 1434 01/02/21 1130 01/03/21 0418 01/04/21 0432  WBC 7.5 7.5 6.1 6.7 6.3  HGB 12.1* 11.5* 11.9* 11.2* 11.2*  HCT 36.9* 35.5* 36.1* 34.0* 33.1*  MCV 97.1 97.8 96.3 96.0 96.5  PLT 176 176 234 211 236    Principal Problem:   Incarcerated incisional hernia with SBO Active Problems:   Allergic rhinitis   SBO (small bowel obstruction) (HCC)   Gastroesophageal reflux disease without esophagitis   Generalized anxiety disorder   Hyperlipidemia   Incarcerated ventral hernia   Recurrent ventral incisional hernia   Time coordinating discharge: 35 minutes  Signed:  Murray Hodgkins, MD  [facility]  01/04/2021, 4:20 PM

## 2021-01-13 DIAGNOSIS — H25811 Combined forms of age-related cataract, right eye: Secondary | ICD-10-CM | POA: Diagnosis not present

## 2021-01-13 DIAGNOSIS — H25812 Combined forms of age-related cataract, left eye: Secondary | ICD-10-CM | POA: Diagnosis not present

## 2021-01-23 ENCOUNTER — Ambulatory Visit: Payer: Self-pay | Admitting: Surgery

## 2021-02-06 ENCOUNTER — Other Ambulatory Visit: Payer: Self-pay

## 2021-02-06 ENCOUNTER — Ambulatory Visit (HOSPITAL_BASED_OUTPATIENT_CLINIC_OR_DEPARTMENT_OTHER)
Admission: RE | Admit: 2021-02-06 | Discharge: 2021-02-06 | Disposition: A | Discharge status: Home / Self Care | Payer: Medicare Other | Source: Ambulatory Visit | Attending: Pulmonary Disease | Admitting: Pulmonary Disease

## 2021-02-06 DIAGNOSIS — R911 Solitary pulmonary nodule: Secondary | ICD-10-CM | POA: Diagnosis not present

## 2021-02-06 DIAGNOSIS — J849 Interstitial pulmonary disease, unspecified: Secondary | ICD-10-CM | POA: Diagnosis not present

## 2021-02-06 DIAGNOSIS — R0602 Shortness of breath: Secondary | ICD-10-CM | POA: Diagnosis not present

## 2021-02-06 DIAGNOSIS — R918 Other nonspecific abnormal finding of lung field: Secondary | ICD-10-CM | POA: Diagnosis not present

## 2021-02-06 DIAGNOSIS — I700 Atherosclerosis of aorta: Secondary | ICD-10-CM | POA: Diagnosis not present

## 2021-02-13 DIAGNOSIS — Z20822 Contact with and (suspected) exposure to covid-19: Secondary | ICD-10-CM | POA: Diagnosis not present

## 2021-02-16 ENCOUNTER — Other Ambulatory Visit: Payer: Self-pay

## 2021-02-16 ENCOUNTER — Encounter: Payer: Self-pay | Admitting: Dermatology

## 2021-02-16 ENCOUNTER — Ambulatory Visit (INDEPENDENT_AMBULATORY_CARE_PROVIDER_SITE_OTHER): Payer: Medicare Other | Admitting: Dermatology

## 2021-02-16 DIAGNOSIS — D0439 Carcinoma in situ of skin of other parts of face: Secondary | ICD-10-CM | POA: Diagnosis not present

## 2021-02-16 DIAGNOSIS — C4492 Squamous cell carcinoma of skin, unspecified: Secondary | ICD-10-CM

## 2021-02-16 NOTE — Patient Instructions (Signed)

## 2021-02-24 DIAGNOSIS — H5203 Hypermetropia, bilateral: Secondary | ICD-10-CM | POA: Diagnosis not present

## 2021-02-24 DIAGNOSIS — H43813 Vitreous degeneration, bilateral: Secondary | ICD-10-CM | POA: Diagnosis not present

## 2021-02-24 DIAGNOSIS — H524 Presbyopia: Secondary | ICD-10-CM | POA: Diagnosis not present

## 2021-02-24 DIAGNOSIS — Z961 Presence of intraocular lens: Secondary | ICD-10-CM | POA: Diagnosis not present

## 2021-02-24 DIAGNOSIS — H52223 Regular astigmatism, bilateral: Secondary | ICD-10-CM | POA: Diagnosis not present

## 2021-03-03 ENCOUNTER — Encounter: Payer: Self-pay | Admitting: Dermatology

## 2021-03-03 NOTE — Progress Notes (Signed)
   Follow-Up Visit   Subjective  [name] is a 75 y.o. male who presents for the following: Procedure (Here for treatment left forehead-sccx 1).  Carcinoma in situ forehead Location:  Duration:  Quality:  Associated Signs/Symptoms: Modifying Factors:  Severity:  Timing: Context:   Objective  Well appearing patient in no apparent distress; mood and affect are within normal limits. Left Forehead Biopsy site identified by nurse and me.    A focused examination was performed including head and neck.. Relevant physical exam findings are noted in the Assessment and Plan.   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 Left Forehead  Destruction of lesion - Left Forehead Complexity: simple   Destruction method: electrodesiccation and curettage   Informed consent: discussed and consent obtained   Timeout:  patient name, date of birth, surgical site, and procedure verified Anesthesia: the lesion was anesthetized in a standard fashion   Anesthetic:  1% lidocaine w/ epinephrine 1-100,000 local infiltration Curettage performed in three different directions: Yes   Curettage cycles:  3 Lesion length (cm):  1.5 Lesion width (cm):  1.5 Margin per side (cm):  0 Final wound size (cm):  1.5 Hemostasis achieved with:  ferric subsulfate Outcome: patient tolerated procedure well with no complications   Additional details:  Wound innoculated with 5 fluorouracil solution.      I, Lavonna Monarch, MD, have reviewed all documentation for this visit.  The documentation on 03/03/21 for the exam, diagnosis, procedures, and orders are all accurate and complete.

## 2021-03-10 ENCOUNTER — Telehealth: Payer: Self-pay | Admitting: Pulmonary Disease

## 2021-03-10 DIAGNOSIS — J849 Interstitial pulmonary disease, unspecified: Secondary | ICD-10-CM

## 2021-03-10 NOTE — Telephone Encounter (Signed)
Pt returning a phone call. Pt can be reached at [phone]

## 2021-03-10 NOTE — Telephone Encounter (Signed)
Apologize for the delay as the results did not come to my inbox  Please let him know that CT looks stable He will need routine PFTs and follow-up with me at next available

## 2021-03-10 NOTE — Telephone Encounter (Signed)
Called and spoke with patient. He was calling to get the results of the CT scan he had done on 02/06/21 (in [place]). CT was ordered by Aaron Edelman back in November 2021.   Dr. Vaughan Browner, can you please advise about the CT results? Thanks!

## 2021-03-10 NOTE — Telephone Encounter (Signed)
Lm x1 for patient.  

## 2021-03-10 NOTE — Telephone Encounter (Signed)
Called and spoke with pt letting him know the info stated by Dr.Mannam and he verbalized understanding. Have scheduled pt for PFT and F/U with Dr. Vaughan Browner. Nothing further needed.

## 2021-04-13 DIAGNOSIS — Z20822 Contact with and (suspected) exposure to covid-19: Secondary | ICD-10-CM | POA: Diagnosis not present

## 2021-04-21 ENCOUNTER — Ambulatory Visit (INDEPENDENT_AMBULATORY_CARE_PROVIDER_SITE_OTHER): Payer: Medicare Other | Admitting: Pulmonary Disease

## 2021-04-21 ENCOUNTER — Other Ambulatory Visit: Payer: Self-pay

## 2021-04-21 ENCOUNTER — Encounter: Payer: Self-pay | Admitting: Pulmonary Disease

## 2021-04-21 VITALS — BP 128/70 | HR 77 | Temp 97.9°F | Ht 71.5 in | Wt 217.4 lb

## 2021-04-21 DIAGNOSIS — Z5181 Encounter for therapeutic drug level monitoring: Secondary | ICD-10-CM

## 2021-04-21 DIAGNOSIS — I745 Embolism and thrombosis of iliac artery: Secondary | ICD-10-CM

## 2021-04-21 DIAGNOSIS — J849 Interstitial pulmonary disease, unspecified: Secondary | ICD-10-CM

## 2021-04-21 LAB — PULMONARY FUNCTION TEST
DL/VA % pred: 102 %
DL/VA: 4.01 ml/min/mmHg/L
DLCO cor % pred: 66 %
DLCO cor: 17.29 ml/min/mmHg
DLCO unc % pred: 66 %
DLCO unc: 17.29 ml/min/mmHg
FEF 25-75 Post: 2.43 L/sec
FEF 25-75 Pre: 2.53 L/sec
FEF2575-%Change-Post: -3 %
FEF2575-%Pred-Post: 104 %
FEF2575-%Pred-Pre: 109 %
FEV1-%Change-Post: -1 %
FEV1-%Pred-Post: 71 %
FEV1-%Pred-Pre: 71 %
FEV1-Post: 2.29 L
FEV1-Pre: 2.32 L
FEV1FVC-%Change-Post: 2 %
FEV1FVC-%Pred-Pre: 114 %
FEV6-%Change-Post: -3 %
FEV6-%Pred-Post: 64 %
FEV6-%Pred-Pre: 66 %
FEV6-Post: 2.68 L
FEV6-Pre: 2.79 L
FEV6FVC-%Change-Post: 0 %
FEV6FVC-%Pred-Post: 106 %
FEV6FVC-%Pred-Pre: 106 %
FVC-%Change-Post: -3 %
FVC-%Pred-Post: 60 %
FVC-%Pred-Pre: 62 %
FVC-Post: 2.68 L
FVC-Pre: 2.79 L
Post FEV1/FVC ratio: 85 %
Post FEV6/FVC ratio: 100 %
Pre FEV1/FVC ratio: 83 %
Pre FEV6/FVC Ratio: 100 %
RV % pred: 66 %
RV: 1.77 L
TLC % pred: 59 %
TLC: 4.36 L

## 2021-04-21 NOTE — Progress Notes (Signed)
PFT done today. 

## 2021-04-21 NOTE — Patient Instructions (Addendum)
I have reviewed your lung function test which does show some worsening Given progression of scarring in the lung we will have decided to start treatment with a medication called Ofev  We will get some labs today in preparation for starting this medication Follow-up in 3 months

## 2021-04-21 NOTE — Progress Notes (Signed)
[name]    [id number]    1946/02/04  Primary Care Physician:Pharr, Thayer Jew, MD  Referring Physician: Deland Pretty, MD [place],  [place] [address]  Chief complaint: Follow-up for interstitial lung disease  HPI: 75 year old former smoker referred to pulmonary for evaluation of interstitial lung disease States that he has mild dyspnea on exertion.  No cough. Recently had a high-res CT and PFTs and is here for evaluation.  Previously evaluated by my partner Dr. 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 Exposures: Exposed to dust.  No asbestos exposure.  No mold, hot tub, Jacuzzi.  No down pillows or comforters Smoking history: 20-pack-year smoker.  Quit in the 1990s Travel history: No significant travel history Relevant family history: No significant family history of lung disease  Interim history: Here for review of PFTs.  Has some dyspnea on exertion without significant change.  Outpatient Encounter Medications as of 04/21/2021  Medication Sig   acetaminophen (TYLENOL) 500 MG tablet Take 500 mg by mouth every 6 (six) hours as needed for headache (pain).   aspirin EC 81 MG tablet Take 81 mg by mouth daily. Swallow whole.   cholecalciferol (VITAMIN D) 25 MCG (1000 UT) tablet Take 1,000 Units by mouth at bedtime.    loratadine (CLARITIN) 10 MG tablet Take 1 tablet (10 mg total) by mouth daily.   omeprazole (PRILOSEC) 10 MG capsule Take 10 mg by mouth every morning.   polycarbophil (FIBERCON) 625 MG tablet Take 1 tablet (625 mg total) by mouth 2 (two) times daily.   polyethylene glycol (MIRALAX / GLYCOLAX) 17 g packet Take 17 g by mouth daily.   rosuvastatin (CRESTOR) 10 MG tablet Take 10 mg by mouth daily.   sertraline (ZOLOFT) 50 MG tablet Take 50 mg by mouth daily as needed (anxiety).   traMADol (ULTRAM) 50 MG tablet Take 1 tablet (50 mg total) by mouth every 6 (six) hours as needed for severe pain.    No facility-administered encounter medications on file as of 04/21/2021.    Physical Exam: Blood pressure 128/70, pulse 77, temperature 97.9 F (36.6 C), temperature source Oral, height 5' 11.5" (1.816 m), weight 217 lb 6.4 oz (98.6 kg), SpO2 97 %. Gen:      No acute distress HEENT:  EOMI, sclera anicteric Neck:     No masses; no thyromegaly Lungs:    Clear to auscultation bilaterally; normal respiratory effort CV:         Regular rate and rhythm; no murmurs Abd:      + bowel sounds; soft, non-tender; no palpable masses, no distension Ext:    No edema; adequate peripheral perfusion Skin:      Warm and dry; no rash Neuro: alert and oriented x 3 Psych: normal mood and affect   Data Reviewed: Imaging: High-res CT 02/05/2020-groundglass attenuation with reticulation and traction bronchiectasis with basal gradient.  2 to 4 mm pulmonary nodules.  Probable UIP pattern.  PFTs: 04/11/2020 FVC 3.10 [69%], FEV1 2.56 [78%], F/F 83, TLC 4.75 [64%], DLCO 21.55 [81%] Moderate restriction  04/21/2021 FVC 2.68 [60%], FEV1 2.29 [71%], F/F 85, TLC 4.36 [59%], DLCO 17.29 [66%] Moderate restriction, severe diffusion defect.  Worsening lung volumes and diffusion defect  Labs: IgE 02/02/2020-1344 Hypersensitivity panel 02/03/2020-negative ANA, CCP, rheumatoid factor 02/03/2020-negative  CTD serologies 04/11/2020-negative  Assessment:  Pulmonary fibrosis CT reviewed with probable UIP pattern No known exposures or CTD symptoms CTD serologies and  exposure history is negative  Given progression on PFTs he is a candidate for treatment.  Discussed options in detail including antifibrotic's and we have decided to proceed with Ofev Check CMP and start paperwork  Plan/Recommendations: Comprehensive metabolic panel Start paperwork for Ofev  Marshell Garfinkel MD [facility] 04/21/2021, 4:19 PM  CC: Deland Pretty, MD

## 2021-04-25 ENCOUNTER — Other Ambulatory Visit (INDEPENDENT_AMBULATORY_CARE_PROVIDER_SITE_OTHER): Payer: Medicare Other

## 2021-04-25 DIAGNOSIS — Z5181 Encounter for therapeutic drug level monitoring: Secondary | ICD-10-CM

## 2021-04-25 LAB — COMPREHENSIVE METABOLIC PANEL
ALT: 15 U/L (ref 0–53)
AST: 22 U/L (ref 0–37)
Albumin: 3.9 g/dL (ref 3.5–5.2)
Alkaline Phosphatase: 82 U/L (ref 39–117)
BUN: 14 mg/dL (ref 6–23)
CO2: 29 mEq/L (ref 19–32)
Calcium: 9.7 mg/dL (ref 8.4–10.5)
Chloride: 100 mEq/L (ref 96–112)
Creatinine, Ser: 1.06 mg/dL (ref 0.40–1.50)
GFR: 67.98 mL/min (ref >60.00)
Glucose, Bld: 91 mg/dL (ref 70–99)
Potassium: 4.4 mEq/L (ref 3.5–5.1)
Sodium: 136 mEq/L (ref 135–145)
Total Bilirubin: 0.6 mg/dL (ref 0.2–1.2)
Total Protein: 7.2 g/dL (ref 6.0–8.3)

## 2021-04-27 ENCOUNTER — Encounter: Payer: Self-pay | Admitting: Pulmonary Disease

## 2021-04-28 ENCOUNTER — Telehealth: Payer: Self-pay

## 2021-04-28 ENCOUNTER — Other Ambulatory Visit (HOSPITAL_COMMUNITY): Payer: Self-pay

## 2021-04-28 NOTE — Telephone Encounter (Signed)
Submitted a Prior Authorization request to [facility] for OFEV via CoverMyMeds. Will update once we receive a response.   Key: [id number]

## 2021-04-28 NOTE — Telephone Encounter (Signed)
Received notification from [facility] regarding a prior authorization for [place]. Authorization has been APPROVED from 08/13/2020 to 04/28/2022.   Per test claim, copay for 10,379.09 days supply is $2,874.38  Patient can fill through [place]: [phone]   Authorization # [id number]   Income documentation from patient is still needed before PAP paperwork can be submitted. Will reach out to patient.

## 2021-04-28 NOTE — Telephone Encounter (Signed)
Received New start paperwork for OFEV. Will update as we work through the benefits process.  

## 2021-04-28 NOTE — Progress Notes (Deleted)
Send letter that labs are normal

## 2021-05-02 NOTE — Telephone Encounter (Signed)
Called and spoke to pt regarding need for income documentation to submit to [facility]. Pt verbalized understanding and stated that he would bring everything in in the next couple days.

## 2021-05-11 ENCOUNTER — Encounter: Payer: Self-pay | Admitting: Pharmacy Technician

## 2021-05-11 NOTE — Telephone Encounter (Signed)
Spoke to patient, he will bring income documents early next week.

## 2021-05-17 NOTE — Telephone Encounter (Signed)
Submitted Patient Assistance Application to [employer] for OFEV along with provider portion, PA and income documents. Will update patient when we receive a response.  Fax# [phone] Phone# [phone]  Jaylon Grode, PharmD, MPH, BCPS Clinical Pharmacist (Rheumatology and Pulmonology) 

## 2021-05-18 ENCOUNTER — Telehealth: Payer: Self-pay | Admitting: Pharmacist

## 2021-05-18 DIAGNOSIS — Z7189 Other specified counseling: Secondary | ICD-10-CM

## 2021-05-18 DIAGNOSIS — Z5181 Encounter for therapeutic drug level monitoring: Secondary | ICD-10-CM

## 2021-05-18 DIAGNOSIS — J849 Interstitial pulmonary disease, unspecified: Secondary | ICD-10-CM

## 2021-05-18 NOTE — Telephone Encounter (Signed)
Spoke with patient regarding approval of Ofev through 08/12/21.  He allowed pharmacy team to retain income documents for re-enrollment application since he is only approved through 08/12/21. Will place in locked cabinet.   Patient counseled on Ofev today in separate telephone encounter.  Knox Saliva, PharmD, MPH, BCPS Clinical Pharmacist (Rheumatology and Pulmonology)

## 2021-05-18 NOTE — Telephone Encounter (Signed)
Subjective:  Patient called today by [facility] pharmacy team for Ofev new start counseling.   Patient was last seen by Dr. Vaughan Browner on 04/21/21. Pertinent past medical history includes ILD with probable UIP pattern on HRCT. He is naive to antifibrotics.  She has PMH of PVD, aortic calcification, HLD, anxiety  History of CAD: No History of MI: No Current anticoagulant use: No History of HTN: No  History of elevated LFTs: No History of diarrhea, nausea, vomiting: No  Objective: Not on File  Outpatient Encounter Medications as of 05/18/2021  Medication Sig   acetaminophen (TYLENOL) 500 MG tablet Take 500 mg by mouth every 6 (six) hours as needed for headache (pain).   aspirin EC 81 MG tablet Take 81 mg by mouth daily. Swallow whole.   cholecalciferol (VITAMIN D) 25 MCG (1000 UT) tablet Take 1,000 Units by mouth at bedtime.    loratadine (CLARITIN) 10 MG tablet Take 1 tablet (10 mg total) by mouth daily.   omeprazole (PRILOSEC) 10 MG capsule Take 10 mg by mouth every morning.   polycarbophil (FIBERCON) 625 MG tablet Take 1 tablet (625 mg total) by mouth 2 (two) times daily.   polyethylene glycol (MIRALAX / GLYCOLAX) 17 g packet Take 17 g by mouth daily.   rosuvastatin (CRESTOR) 10 MG tablet Take 10 mg by mouth daily.   sertraline (ZOLOFT) 50 MG tablet Take 50 mg by mouth daily as needed (anxiety).   traMADol (ULTRAM) 50 MG tablet Take 1 tablet (50 mg total) by mouth every 6 (six) hours as needed for severe pain.   No facility-administered encounter medications on file as of 05/18/2021.     Immunization History  Administered Date(s) Administered   Fluad Quad(high Dose 65+) 05/13/2019, 07/12/2020   Influenza, High Dose Seasonal PF 05/26/2015, 05/07/2016   Influenza, Quadrivalent, Recombinant, Inj, Pf 06/18/2017, 06/30/2018, 07/07/2019   Influenza-Unspecified 07/20/2013, 04/27/2014, 06/13/2018   PFIZER Comirnaty(Gray Top)Covid-19 Tri-Sucrose Vaccine 09/13/2020   PFIZER(Purple  Top)SARS-COV-2 Vaccination 09/26/2019, 10/18/2019   Pneumococcal Conjugate-13 04/22/2014   Pneumococcal-Unspecified 10/18/2009   Tdap 09/17/2006, 06/04/2016   Zoster, Live 09/17/2006      PFT's TLC  Date Value Ref Range Status  04/21/2021 4.36 L Final      CMP     Component Value Date/Time   NA 136 04/25/2021 0926   K 4.4 04/25/2021 0926   CL 100 04/25/2021 0926   CO2 29 04/25/2021 0926   GLUCOSE 91 04/25/2021 0926   BUN 14 04/25/2021 0926   CREATININE 1.06 04/25/2021 0926   CALCIUM 9.7 04/25/2021 0926   PROT 7.2 04/25/2021 0926   ALBUMIN 3.9 04/25/2021 0926   AST 22 04/25/2021 0926   ALT 15 04/25/2021 0926   ALKPHOS 82 04/25/2021 0926   BILITOT 0.6 04/25/2021 0926   GFRNONAA >60 01/04/2021 0432   GFRAA >60 02/02/2019 1520      CBC    Component Value Date/Time   WBC 6.3 01/04/2021 0432   RBC 3.43 (L) 01/04/2021 0432   HGB 11.2 (L) 01/04/2021 0432   HCT 33.1 (L) 01/04/2021 0432   PLT 236 01/04/2021 0432   MCV 96.5 01/04/2021 0432   MCH 32.7 01/04/2021 0432   MCHC 33.8 01/04/2021 0432   RDW 13.3 01/04/2021 0432   LYMPHSABS 2.1 12/28/2020 0400   MONOABS 1.0 12/28/2020 0400   EOSABS 0.2 12/28/2020 0400   BASOSABS 0.0 12/28/2020 0400      LFT's Hepatic Function Latest Ref Rng & Units 04/25/2021 01/04/2021 12/30/2020  Total Protein 6.0 -  8.3 g/dL 7.2 5.7(L) 6.5  Albumin 3.5 - 5.2 g/dL 3.9 2.8(L) 3.2(L)  AST 0 - 37 U/L 22 53(H) 28  ALT 0 - 53 U/L 15 35 14  Alk Phosphatase 39 - 117 U/L 82 88 72  Total Bilirubin 0.2 - 1.2 mg/dL 0.6 0.6 0.2(L)  Bilirubin, Direct - - - -     HRCT (02/06/21 ) - No significant change in mild pulmonary fibrosis in a pattern with apical to basal gradient, featuring irregular peripheral interstitial opacity, septal thickening, and subpleural bronchiolectasis in the bilateral lung bases without clear evidence of honeycombing. Findings remain consistent with a "probable UIP" pattern by pulmonary fibrosis criteria. Findings are  categorized as probable UIP per consensus guidelines: Diagnosis of Idiopathic Pulmonary Fibrosis  Assessment and Plan  Ofev Medication Management Thoroughly counseled patient on the efficacy, mechanism of action, dosing, administration, adverse effects, and monitoring parameters of Ofev. Patient verbalized understanding. Patient education handout provided.   Goals of Therapy: Will not stop or reverse the progression of ILD. It will slow the progression of ILD.  Inhibits tyrosine kinase inhibitors which slow the fibrosis/progression of ILD -Significant reduction in the rate of disease progression was observed after treatment (61.1% [before] vs 33.3% [after], P?=?0.008) over 42 weeks.  Dosing: 150 mg (one capsule) by mouth twice daily (approx 12 hours apart). Discussed taking with food approximately 12 hours apart. Discussed that capsule should not be crushed or split.  Adverse Effects: Nausea, vomiting, diarrhea (2 in 3 patients) appetite loss, weight loss - management of diarrhea with loperamide discussed including max use of 48 hours and max of 8 capsules per day. Abdominal pain (up to 1 in 5 patients) Nasopharyngitis (13%), UTI (6%) Risk of thrombosis (3%) and acute MI (2%) Hypertension (5%) Dizziness Fatigue (10%) Ofev Open Doors form for nursing support faxed today per patient's verbal consent. He has been advised that nurses will reach out to him to provide counseling and they are available to assist with side effect management  Monitoring: Monitor for diarrhea, nausea and vomiting, GI perforation, hepatotoxicity  Monitor LFTs - baseline, monthly for first 6 months, then every 3 months routinely CBC w differential at baseline and every 3 months routinely  Access: Approval of Ofev through: insurance Rx sent to: [employer] for Ofev: [phone] ([place])  Medication Reconciliation A drug regimen assessment was performed, including review of allergies, interactions,  disease-state management, dosing and immunization history. Medications were reviewed with the patient, including name, instructions, indication, goals of therapy, potential side effects, importance of adherence, and safe use.  Anticoagulant use: No  Immunizations Patient is UTD on pneumonia vaccine. He received on live zoster vaccine on 09/17/2006 and is eligible for inactivated 2-dose Shingrix vaccine Patient has received 3 COVID19 vaccines.  This appointment required 30 minutes of patient care (this includes precharting, chart review, review of results, face-to-face care, etc.).  Thank you for involving pharmacy to assist in providing this patient's care.    Knox Saliva, PharmD, MPH, BCPS Clinical Pharmacist (Rheumatology and Pulmonology)

## 2021-05-18 NOTE — Telephone Encounter (Signed)
Received a fax from  [facility] regarding an approval for [place] patient assistance from 05/17/21 to 08/12/21.   Phone number: [phone]

## 2021-05-26 MED ORDER — OFEV 150 MG PO CAPS: 150.0000 mg | ORAL_CAPSULE | Freq: Two times a day (BID) | ORAL | 0 refills | Status: DC

## 2021-05-26 NOTE — Telephone Encounter (Signed)
I have called the patient to make a follow up with Dr. Vaughan Browner per pharmacy request after starting OFEV. The patient has been given a follow up date and he verbalized understanding. No other questions.

## 2021-06-20 ENCOUNTER — Ambulatory Visit: Payer: Medicare Other | Admitting: Pulmonary Disease

## 2021-07-12 DIAGNOSIS — M8580 Other specified disorders of bone density and structure, unspecified site: Secondary | ICD-10-CM | POA: Diagnosis not present

## 2021-07-12 DIAGNOSIS — K219 Gastro-esophageal reflux disease without esophagitis: Secondary | ICD-10-CM | POA: Diagnosis not present

## 2021-07-12 DIAGNOSIS — I739 Peripheral vascular disease, unspecified: Secondary | ICD-10-CM | POA: Diagnosis not present

## 2021-07-12 DIAGNOSIS — E785 Hyperlipidemia, unspecified: Secondary | ICD-10-CM | POA: Diagnosis not present

## 2021-08-25 ENCOUNTER — Telehealth: Payer: Self-pay

## 2021-08-25 ENCOUNTER — Other Ambulatory Visit: Payer: Self-pay

## 2021-08-25 ENCOUNTER — Ambulatory Visit (INDEPENDENT_AMBULATORY_CARE_PROVIDER_SITE_OTHER): Payer: Medicare Other | Admitting: Pulmonary Disease

## 2021-08-25 ENCOUNTER — Encounter: Payer: Self-pay | Admitting: Pulmonary Disease

## 2021-08-25 VITALS — BP 136/82 | HR 71 | Temp 98.3°F | Ht 72.0 in | Wt 215.4 lb

## 2021-08-25 DIAGNOSIS — J849 Interstitial pulmonary disease, unspecified: Secondary | ICD-10-CM | POA: Diagnosis not present

## 2021-08-25 DIAGNOSIS — Z23 Encounter for immunization: Secondary | ICD-10-CM | POA: Diagnosis not present

## 2021-08-25 DIAGNOSIS — J84112 Idiopathic pulmonary fibrosis: Secondary | ICD-10-CM | POA: Diagnosis not present

## 2021-08-25 DIAGNOSIS — Z5181 Encounter for therapeutic drug level monitoring: Secondary | ICD-10-CM

## 2021-08-25 HISTORY — DX: Idiopathic pulmonary fibrosis: J84.112

## 2021-08-25 LAB — HEPATIC FUNCTION PANEL
ALT: 15 U/L (ref 0–53)
AST: 23 U/L (ref 0–37)
Albumin: 4 g/dL (ref 3.5–5.2)
Alkaline Phosphatase: 73 U/L (ref 39–117)
Bilirubin, Direct: 0.1 mg/dL (ref 0.0–0.3)
Total Bilirubin: 0.6 mg/dL (ref 0.2–1.2)
Total Protein: 7 g/dL (ref 6.0–8.3)

## 2021-08-25 NOTE — Telephone Encounter (Signed)
Compiling paperwork for 2023 renewal to [facility].  Provider portion to be placed in Dr. Matilde Bash box for signature.

## 2021-08-25 NOTE — Progress Notes (Signed)
[name]    [id number]    [birth year]  Primary Care Physician:Pharr, Thayer Jew, MD  Referring Physician: Deland Pretty, MD [facility],  [place] [address]  Chief complaint: Follow-up for interstitial lung disease  HPI: 76 year old former smoker referred to pulmonary for evaluation of interstitial lung disease States that he has mild dyspnea on exertion.  No cough. Recently had a high-res CT and PFTs and is here for evaluation.  Previously evaluated by my partner Dr. 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 Exposures: Exposed to dust.  No asbestos exposure.  No mold, hot tub, Jacuzzi.  No down pillows or comforters Smoking history: 20-pack-year smoker.  Quit in the 1990s Travel history: No significant travel history Relevant family history: No significant family history of lung disease  Interim history: Here for review of PFTs.  Has some dyspnea on exertion without significant change.  Outpatient Encounter Medications as of 08/25/2021  Medication Sig   acetaminophen (TYLENOL) 500 MG tablet Take 500 mg by mouth every 6 (six) hours as needed for headache (pain).   aspirin EC 81 MG tablet Take 81 mg by mouth daily. Swallow whole.   cholecalciferol (VITAMIN D) 25 MCG (1000 UT) tablet Take 1,000 Units by mouth at bedtime.    loratadine (CLARITIN) 10 MG tablet Take 1 tablet (10 mg total) by mouth daily.   Nintedanib (OFEV) 150 MG CAPS Take 1 capsule (150 mg total) by mouth 2 (two) times daily.   omeprazole (PRILOSEC) 10 MG capsule Take 10 mg by mouth every morning.   polycarbophil (FIBERCON) 625 MG tablet Take 1 tablet (625 mg total) by mouth 2 (two) times daily.   polyethylene glycol (MIRALAX / GLYCOLAX) 17 g packet Take 17 g by mouth daily.   rosuvastatin (CRESTOR) 10 MG tablet Take 10 mg by mouth daily.   sertraline (ZOLOFT) 50 MG tablet Take 50 mg by mouth daily as needed (anxiety).   traMADol (ULTRAM) 50 MG  tablet Take 1 tablet (50 mg total) by mouth every 6 (six) hours as needed for severe pain.   No facility-administered encounter medications on file as of 08/25/2021.    Physical Exam: Blood pressure 128/70, pulse 77, temperature 97.9 F (36.6 C), temperature source Oral, height 5' 11.5" (1.816 m), weight 217 lb 6.4 oz (98.6 kg), SpO2 97 %. Gen:      No acute distress HEENT:  EOMI, sclera anicteric Neck:     No masses; no thyromegaly Lungs:    Clear to auscultation bilaterally; normal respiratory effort CV:         Regular rate and rhythm; no murmurs Abd:      + bowel sounds; soft, non-tender; no palpable masses, no distension Ext:    No edema; adequate peripheral perfusion Skin:      Warm and dry; no rash Neuro: alert and oriented x 3 Psych: normal mood and affect   Data Reviewed: Imaging: High-res CT 02/05/2020-groundglass attenuation with reticulation and traction bronchiectasis with basal gradient.  2 to 4 mm pulmonary nodules.  Probable UIP pattern.  PFTs: 04/11/2020 FVC 3.10 [69%], FEV1 2.56 [78%], F/F 83, TLC 4.75 [64%], DLCO 21.55 [81%] Moderate restriction  04/21/2021 FVC 2.68 [60%], FEV1 2.29 [71%], F/F 85, TLC 4.36 [59%], DLCO 17.29 [66%] Moderate restriction, severe diffusion defect.  Worsening lung volumes and diffusion defect  Labs: IgE 02/02/2020-1344 Hypersensitivity panel 02/03/2020-negative ANA, CCP, rheumatoid factor 02/03/2020-negative  CTD serologies 04/11/2020-negative  Assessment:  Pulmonary fibrosis CT reviewed with probable UIP pattern No known exposures or CTD symptoms CTD serologies and exposure history is negative  Given progression on PFTs he is a candidate for treatment.  Discussed options in detail including antifibrotic's and we have decided to proceed with Ofev Check CMP and start paperwork  Plan/Recommendations: Comprehensive metabolic panel Start paperwork for Ofev  Marshell Garfinkel MD [facility] 08/25/2021, 8:57  AM  CC: Deland Pretty, MD

## 2021-08-25 NOTE — Progress Notes (Signed)
[name]    [id number]    September 27, 1945  Primary Care Physician:Pharr, Thayer Jew, MD  Referring Physician: Deland Pretty, MD [facility] [place],  [place] [address]  Chief complaint: Follow-up for interstitial lung disease, IPF  HPI: 76 year old former smoker referred to pulmonary for evaluation of interstitial lung disease States that he has mild dyspnea on exertion.  No cough. Referred for evaluation of progressive probable UIP pulmonary fibrosis  Pets: No pets Occupation: Works as a Wellsite geologist with heavy Investment banker, operational. Exposures: Exposed to dust.  No asbestos exposure.  No mold, hot tub, Jacuzzi.  No down pillows or comforters Smoking history: 20-pack-year smoker.  Quit in the 1990s Travel history: No significant travel history Relevant family history: No significant family history of lung disease  Interim history: Given progression on PFTs he was started on Ofev and October 2023.  He is tolerating it well except for occasional diarrhea.  He has not received a shipment this month and is out of medication for the past 2 days  Dyspnea on exertion is stable  Outpatient Encounter Medications as of 08/25/2021  Medication Sig   acetaminophen (TYLENOL) 500 MG tablet Take 500 mg by mouth every 6 (six) hours as needed for headache (pain).   aspirin EC 81 MG tablet Take 81 mg by mouth daily. Swallow whole.   cholecalciferol (VITAMIN D) 25 MCG (1000 UT) tablet Take 1,000 Units by mouth at bedtime.    loratadine (CLARITIN) 10 MG tablet Take 1 tablet (10 mg total) by mouth daily.   Nintedanib (OFEV) 150 MG CAPS Take 1 capsule (150 mg total) by mouth 2 (two) times daily.   omeprazole (PRILOSEC) 10 MG capsule Take 10 mg by mouth every morning.   polycarbophil (FIBERCON) 625 MG tablet Take 1 tablet (625 mg total) by mouth 2 (two) times daily.   polyethylene glycol (MIRALAX / GLYCOLAX) 17 g packet Take 17 g by mouth daily.   rosuvastatin (CRESTOR) 10 MG tablet Take 10 mg  by mouth daily.   sertraline (ZOLOFT) 50 MG tablet Take 50 mg by mouth daily as needed (anxiety).   traMADol (ULTRAM) 50 MG tablet Take 1 tablet (50 mg total) by mouth every 6 (six) hours as needed for severe pain.   No facility-administered encounter medications on file as of 08/25/2021.    Physical Exam: Gen:      No acute distress HEENT:  EOMI, sclera anicteric Neck:     No masses; no thyromegaly Lungs:    Bibasal crackles CV:         Regular rate and rhythm; no murmurs Abd:      + bowel sounds; soft, non-tender; no palpable masses, no distension Ext:    No edema; adequate peripheral perfusion Skin:      Warm and dry; no rash Neuro: alert and oriented x 3 Psych: normal mood and affect   Data Reviewed: Imaging: High-res CT 02/05/2020-groundglass attenuation with reticulation and traction bronchiectasis with basal gradient.  2 to 4 mm pulmonary nodules.  Probable UIP pattern.  PFTs: 04/11/2020 FVC 3.10 [69%], FEV1 2.56 [78%], F/F 83, TLC 4.75 [64%], DLCO 21.55 [81%] Moderate restriction  04/21/2021 FVC 2.68 [60%], FEV1 2.29 [71%], F/F 85, TLC 4.36 [59%], DLCO 17.29 [66%] Moderate restriction, severe diffusion defect.  Worsening lung volumes and diffusion defect  Labs: IgE 02/02/2020-1344 Hypersensitivity panel 02/03/2020-negative ANA, CCP, rheumatoid factor 02/03/2020-negative  CTD serologies 04/11/2020-negative  Assessment:  Pulmonary fibrosis CT reviewed with probable UIP pattern  No known exposures or CTD symptoms CTD serologies and exposure history is negative  Given progression on PFTs he is a candidate for treatment.  Discussed options in detail including antifibrotic's and we have decided to proceed with Ofev which was started in November 2022  He will need to call the pharmacy to resume treatments of Ofev Check monthly hepatic panel  Plan/Recommendations: Resume Ofev Monthly hepatic panel  Marshell Garfinkel MD [facility] 08/25/2021, 9:00  AM  CC: Deland Pretty, MD

## 2021-08-25 NOTE — Addendum Note (Signed)
Addended by: Elton Sin on: 08/25/2021 09:28 AM   Modules accepted: Orders

## 2021-08-25 NOTE — Patient Instructions (Signed)
I am glad you are stable with your breathing We need to get back in touch with pharmacy to resume treatments of Ofev as you need to keep taking the medication We will check liver test today and monthly for the next 2 months Follow-up in clinic in 3 months

## 2021-08-29 NOTE — Telephone Encounter (Signed)
Submitted Patient Assistance Application to [employer] for OFEV along with paitent portion, provider portion, PA, insurance card copy, med list, and income documents. Will update patient when we receive a response.  Fax# [phone] Phone# [phone]  Knox Saliva, PharmD, MPH, BCPS Clinical Pharmacist (Rheumatology and Pulmonology)

## 2021-08-30 NOTE — Telephone Encounter (Addendum)
Received a fax from  [facility] regarding an approval for [place] patient assistance from 08/29/2021 to 08/12/2022. Approval letter and application sent to scan center.  Addendum by Knox Saliva (PharmD): Notified patient. States shipment of Ofev will be received tomorrow. Notified patient that LFTs drawn on 08/25/21 are wnl  Knox Saliva, PharmD, MPH, BCPS Clinical Pharmacist (Rheumatology and Pulmonology)

## 2021-09-15 DIAGNOSIS — Z125 Encounter for screening for malignant neoplasm of prostate: Secondary | ICD-10-CM | POA: Diagnosis not present

## 2021-09-15 DIAGNOSIS — E785 Hyperlipidemia, unspecified: Secondary | ICD-10-CM | POA: Diagnosis not present

## 2021-09-19 DIAGNOSIS — Z0000 Encounter for general adult medical examination without abnormal findings: Secondary | ICD-10-CM | POA: Diagnosis not present

## 2021-09-19 DIAGNOSIS — Z86010 Personal history of colonic polyps: Secondary | ICD-10-CM | POA: Diagnosis not present

## 2021-09-19 DIAGNOSIS — I700 Atherosclerosis of aorta: Secondary | ICD-10-CM | POA: Diagnosis not present

## 2021-09-19 DIAGNOSIS — J849 Interstitial pulmonary disease, unspecified: Secondary | ICD-10-CM | POA: Diagnosis not present

## 2021-09-19 DIAGNOSIS — J309 Allergic rhinitis, unspecified: Secondary | ICD-10-CM | POA: Diagnosis not present

## 2021-09-19 DIAGNOSIS — Z23 Encounter for immunization: Secondary | ICD-10-CM | POA: Diagnosis not present

## 2021-09-19 DIAGNOSIS — K219 Gastro-esophageal reflux disease without esophagitis: Secondary | ICD-10-CM | POA: Diagnosis not present

## 2021-09-19 DIAGNOSIS — M8580 Other specified disorders of bone density and structure, unspecified site: Secondary | ICD-10-CM | POA: Diagnosis not present

## 2021-09-19 DIAGNOSIS — I739 Peripheral vascular disease, unspecified: Secondary | ICD-10-CM | POA: Diagnosis not present

## 2021-09-19 DIAGNOSIS — F411 Generalized anxiety disorder: Secondary | ICD-10-CM | POA: Diagnosis not present

## 2021-09-19 DIAGNOSIS — R972 Elevated prostate specific antigen [PSA]: Secondary | ICD-10-CM | POA: Diagnosis not present

## 2021-09-19 DIAGNOSIS — K4090 Unilateral inguinal hernia, without obstruction or gangrene, not specified as recurrent: Secondary | ICD-10-CM | POA: Diagnosis not present

## 2021-10-06 ENCOUNTER — Ambulatory Visit: Payer: Self-pay | Admitting: General Surgery

## 2021-10-06 DIAGNOSIS — K4090 Unilateral inguinal hernia, without obstruction or gangrene, not specified as recurrent: Secondary | ICD-10-CM | POA: Diagnosis not present

## 2021-10-06 NOTE — H&P (Signed)
Chief Complaint: Follow-up       History of Present Illness: [name] is a 76 y.o. male who is seen today as an office consultation at the request of Dr. Shelia Media for evaluation of Follow-up .     Patient is a 76 year old male, with a history of PAD status post femorofemoral artery bypass, who comes in secondary to a left inguinal/incisional hernia. Patient states that the hernias been there over the last year.  He states he has no pain today but has noticed it is gotten larger.  Patient states he notices the hernia mainly when he is coughing/or straining.     He has had multiple abdominal surgeries, to include 2 laparoscopic Ventral hernia repairs with mesh by Dr. Johney Maine.     Patient also with a periumbilical midline incision secondary to small bowel resection.     Review of Systems: A complete review of systems was obtained from the patient.  I have reviewed this information and discussed as appropriate with the patient.  See HPI as well for other ROS.   Review of Systems  Constitutional: Negative for fever.  HENT: Negative for congestion.   Eyes: Negative for blurred vision.  Respiratory: Negative for cough, shortness of breath and wheezing.   Cardiovascular: Negative for chest pain and palpitations.  Gastrointestinal: Negative for heartburn.  Genitourinary: Negative for dysuria.  Musculoskeletal: Negative for myalgias.  Skin: Negative for rash.  Neurological: Negative for dizziness and headaches.  Psychiatric/Behavioral: Negative for depression and suicidal ideas.  All other systems reviewed and are negative.       Medical History: Past Medical History Past Medical History: Diagnosis Date  Asthma, unspecified asthma severity, unspecified whether complicated, unspecified whether persistent        There is no problem list on file for this patient.     Past Surgical History History reviewed. No pertinent surgical history.     Allergies No Known  Allergies    Current Outpatient Medications on File Prior to Visit Medication Sig Dispense Refill  omeprazole (PRILOSEC) 10 MG DR capsule TAKE 1 CAPSULE BY MOUTH EVERY DAY 30 MINUTES BEFORE BREAKFAST      aspirin 81 MG EC tablet Take by mouth      cholecalciferol, vitamin D3, (VITAMIN D3) 10 mcg (400 unit) Cap 1 capsule      rosuvastatin (CRESTOR) 40 MG tablet Take 40 mg by mouth once daily      sertraline (ZOLOFT) 50 MG tablet Take 1 tablet by mouth once daily       No current facility-administered medications on file prior to visit.     Family History Family History Problem Relation Age of Onset  Colon cancer Father    Coronary Artery Disease (Blocked arteries around heart) Brother        Social History   Tobacco Use Smoking Status Former  Types: Cigarettes  Quit date: 1993  Years since quitting: 30.1 Smokeless Tobacco Never     Social History Social History    Socioeconomic History  Marital status: Widowed Tobacco Use  Smoking status: Former     Types: Cigarettes     Quit date: 1993     Years since quitting: 30.1  Smokeless tobacco: Never Substance and Sexual Activity  Alcohol use: Never  Drug use: Never      Objective:     Vitals:   10/06/21 1112 BP: 126/80 Pulse: 78 Temp: 36.3 C (97.3 F) SpO2: 96% Weight: 97 kg (213 lb 12.8 oz) Height: 182.9 cm (  6')   Body mass index is 29 kg/m. Physical Exam Constitutional:      Appearance: Normal appearance.  HENT:     Head: Normocephalic and atraumatic.     Nose: Nose normal. No congestion.     Mouth/Throat:     Mouth: Mucous membranes are moist.     Pharynx: Oropharynx is clear.  Eyes:     Pupils: Pupils are equal, round, and reactive to light.  Cardiovascular:     Rate and Rhythm: Normal rate and regular rhythm.     Pulses: Normal pulses.     Heart sounds: Normal heart sounds. No murmur heard.   No friction rub. No gallop.  Pulmonary:     Effort: Pulmonary effort is normal. No respiratory  distress.     Breath sounds: Normal breath sounds. No stridor. No wheezing, rhonchi or rales.  Abdominal:     General: Abdomen is flat.     Hernia: A hernia is present. Hernia is present in the left inguinal area. There is no hernia in the right inguinal area.    Musculoskeletal:        General: Normal range of motion.     Cervical back: Normal range of motion.  Skin:    General: Skin is warm and dry.  Neurological:     General: No focal deficit present.     Mental Status: He is alert and oriented to person, place, and time.  Psychiatric:        Mood and Affect: Mood normal.        Thought Content: Thought content normal.          Assessment and Plan: Diagnoses and all orders for this visit:   Unilateral inguinal hernia without obstruction or gangrene, recurrence not specified     [name] is a 76 y.o. male      Patient appears to have a left inguinal hernia, this is near the previous femorofemoral incision.  We will obtain a CT scan to further evaluate the anatomy.  Patient will be an excellent candidate for laparoscopic left inguinal hernia repair with mesh.  If CT scan appears to show straightforward inguinal hernia we will proceed with surgery.  If there is any significant findings we will have him follow back up to discuss treatment options.     1.  We will proceed to the OR for a laparoscopic left inguinal hernia repair with mesh. 2. All risks and benefits were discussed with the patient, to generally include infection, bleeding, damage to surrounding structures, acute and chronic nerve pain, and recurrence. Alternatives were offered and described.  All questions were answered and the patient voiced understanding of the procedure and wishes to proceed at this point.             No follow-ups on file.   Ralene Ok, MD, [facility], [facility]

## 2021-10-12 ENCOUNTER — Other Ambulatory Visit: Payer: Self-pay | Admitting: General Surgery

## 2021-10-12 DIAGNOSIS — K4090 Unilateral inguinal hernia, without obstruction or gangrene, not specified as recurrent: Secondary | ICD-10-CM

## 2021-10-17 ENCOUNTER — Other Ambulatory Visit: Payer: Self-pay

## 2021-10-17 DIAGNOSIS — I745 Embolism and thrombosis of iliac artery: Secondary | ICD-10-CM

## 2021-10-17 DIAGNOSIS — I739 Peripheral vascular disease, unspecified: Secondary | ICD-10-CM

## 2021-10-20 DIAGNOSIS — M2550 Pain in unspecified joint: Secondary | ICD-10-CM | POA: Diagnosis not present

## 2021-10-20 DIAGNOSIS — M436 Torticollis: Secondary | ICD-10-CM | POA: Diagnosis not present

## 2021-11-02 ENCOUNTER — Other Ambulatory Visit: Payer: Self-pay

## 2021-11-02 ENCOUNTER — Ambulatory Visit (INDEPENDENT_AMBULATORY_CARE_PROVIDER_SITE_OTHER)
Admission: RE | Admit: 2021-11-02 | Discharge: 2021-11-02 | Disposition: A | Discharge status: Home / Self Care | Payer: Medicare Other | Source: Ambulatory Visit | Attending: Vascular Surgery | Admitting: Vascular Surgery

## 2021-11-02 ENCOUNTER — Encounter: Payer: Self-pay | Admitting: Physician Assistant

## 2021-11-02 ENCOUNTER — Ambulatory Visit (HOSPITAL_COMMUNITY)
Admission: RE | Admit: 2021-11-02 | Discharge: 2021-11-02 | Disposition: A | Discharge status: Home / Self Care | Payer: Medicare Other | Source: Ambulatory Visit | Attending: Vascular Surgery | Admitting: Vascular Surgery

## 2021-11-02 ENCOUNTER — Ambulatory Visit (INDEPENDENT_AMBULATORY_CARE_PROVIDER_SITE_OTHER): Payer: Medicare Other | Admitting: Physician Assistant

## 2021-11-02 VITALS — BP 126/72 | HR 62 | Temp 96.4°F | Ht 72.0 in | Wt 210.4 lb

## 2021-11-02 DIAGNOSIS — I739 Peripheral vascular disease, unspecified: Secondary | ICD-10-CM

## 2021-11-02 DIAGNOSIS — I745 Embolism and thrombosis of iliac artery: Secondary | ICD-10-CM | POA: Diagnosis not present

## 2021-11-02 NOTE — Progress Notes (Signed)
[facility]  ?HISTORY AND PHYSICAL  ? ?History of Present Illness:  Patient is a 76 y.o. year old male who presents for evaluation of PAD.  He is s/p right to left fem- fem bypass with PTFE graft 01/13/2019. This was due to critical limb ischemia with occlusion of his left external iliac artery.   ? He is ambulatory without claudication, no rest pain or non healing wounds.  His mobility is limited by his lungs and SOB with exertion.    He does mention a left LQ inguinal hernia that is soft and non painful  at this time and growing slightly with time.  He is being seen by [place] [facility] for guidance or intervention if neccessary.   ? ?   ? ?Past Medical History:  ?Diagnosis Date  ? Basal cell carcinoma 02/21/2011  ? left inner cheek, lateral (CX35FU)  ? Depression   ? GERD (gastroesophageal reflux disease)   ? Hyperlipidemia   ? Osteopenia   ? Squamous cell carcinoma of skin 07/21/2019  ? in situ- fornt center scalp (CX35FU)  ? Squamous cell carcinoma, face 12/13/2018  ? in situ- left forehead(CX35FU)  ? ? ?Past Surgical History:  ?Procedure Laterality Date  ? ABDOMINAL WALL MESH  REMOVAL  05/29/2008  ? FEMORAL-FEMORAL BYPASS GRAFT Left 01/13/2019  ? Procedure: BYPASS GRAFT FEMORAL-FEMORAL ARTERY;  Surgeon: Angelia Mould, MD;  Location: [facility];  Service: Vascular;  Laterality: Left;  ? INGUINAL HERNIA REPAIR N/A 12/28/2020  ? Procedure: LAPAROSCOPIC VENTERAL HERNIA REPAIR WITH MESH, LYSIS OF ADHESIONS;  Surgeon: Michael Boston, MD;  Location: [facility];  Service: General;  Laterality: N/A;  ? LUMBAR LAMINECTOMY    ? ORCHIECTOMY Left 01/12/2019  ? Procedure: LEFT INGUINAL RADICAL ORCHIECTOMY;  Surgeon: Lucas Mallow, MD;  Location: [facility];  Service: Urology;  Laterality: Left;  ? ROTATOR CUFF REPAIR Right   ? SMALL INTESTINE SURGERY  05/29/2008  ? SBO into mesh - SB resection, mesh removal & primary hernia repair  ? UMBILICAL HERNIA REPAIR  2009  ? open with  Ventralex mesh patch  ? ? ?ROS:  ? ?General:  No weight loss, Fever, chills ? ?HEENT: No recent headaches, no nasal bleeding, no visual changes, no sore throat ? ?Neurologic: No dizziness, blackouts, seizures. No recent symptoms of stroke or mini- stroke. No recent episodes of slurred speech, or temporary blindness. ? ?Cardiac: No recent episodes of chest pain/pressure, no shortness of breath at rest.  No shortness of breath with exertion.  Denies history of atrial fibrillation or irregular heartbeat ? ?Vascular: No history of rest pain in feet.  No history of claudication.  No history of non-healing ulcer, No history of DVT  ? ?Pulmonary: No home oxygen, no productive cough, no hemoptysis,  No asthma or wheezing ? ?Musculoskeletal:  '[ ]'$  Arthritis, '[ ]'$  Low back pain,  '[ ]'$  Joint pain ? ?Hematologic:No history of hypercoagulable state.  No history of easy bleeding.  No history of anemia ? ?Gastrointestinal: No hematochezia or melena,  No gastroesophageal reflux, no trouble swallowing left inguinal hernia not incarcerated  ? ?Urinary: '[ ]'$  chronic Kidney disease, '[ ]'$  on HD - '[ ]'$  MWF or '[ ]'$  TTHS, '[ ]'$  Burning with urination, '[ ]'$  Frequent urination, '[ ]'$  Difficulty urinating;  ? ?Skin: No rashes ? ?Psychological: No history of anxiety,  No history of depression ? ?Social History ?Social History  ? ?Tobacco Use  ? Smoking status: Former  ?  Packs/day: 1.00  ?  Years: 28.00  ?  Pack years: 28.00  ?  Types: Cigarettes  ? Smokeless tobacco: Never  ? Tobacco comments:  ?  quit 1992  ?Vaping Use  ? Vaping Use: Never used  ?Substance Use Topics  ? Alcohol use: Yes  ?  Alcohol/week: 6.0 standard drinks  ?  Types: 6 Cans of beer per week  ?  Comment: beer occasional  ? Drug use: Never  ? ? ?Family History ?Family History  ?Problem Relation Age of Onset  ? Other Neg Hx   ?     No FHx of small bowel obstruction  ? ? ?Allergies ? ?No Known Allergies ? ? ?Current Outpatient Medications  ?Medication Sig Dispense Refill  ? acetaminophen  (TYLENOL) 500 MG tablet Take 500 mg by mouth every 6 (six) hours as needed for headache (pain).    ? aspirin EC 81 MG tablet Take 81 mg by mouth daily. Swallow whole.    ? cholecalciferol (VITAMIN D) 25 MCG (1000 UT) tablet Take 1,000 Units by mouth at bedtime.     ? loratadine (CLARITIN) 10 MG tablet Take 1 tablet (10 mg total) by mouth daily.    ? Nintedanib (OFEV) 150 MG CAPS Take 1 capsule (150 mg total) by mouth 2 (two) times daily. 90 capsule 0  ? omeprazole (PRILOSEC) 10 MG capsule Take 10 mg by mouth every morning.    ? polycarbophil (FIBERCON) 625 MG tablet Take 1 tablet (625 mg total) by mouth 2 (two) times daily.    ? polyethylene glycol (MIRALAX / GLYCOLAX) 17 g packet Take 17 g by mouth daily. 14 each 0  ? rosuvastatin (CRESTOR) 10 MG tablet Take 10 mg by mouth daily.    ? sertraline (ZOLOFT) 50 MG tablet Take 50 mg by mouth daily as needed (anxiety).    ? traMADol (ULTRAM) 50 MG tablet Take 1 tablet (50 mg total) by mouth every 6 (six) hours as needed for severe pain. 10 tablet 0  ? ?No current facility-administered medications for this visit.  ? ? ?Physical Examination ? ?Vitals:  ? 11/02/21 1205  ?BP: 126/72  ?Pulse: 62  ?Temp: (!) 96.4 ?F (35.8 ?C)  ?Weight: 210 lb 6.4 oz (95.4 kg)  ?Height: 6' (1.829 m)  ? ? ?Body mass index is 28.54 kg/m?. ? ?General:  Alert and oriented, no acute distress ?HEENT: Normal ?Neck: No bruit or JVD ?Pulmonary: Clear to auscultation bilaterally ?Cardiac: Regular Rate and Rhythm without murmur ?Abdomen: Soft, non-tender, non-distended, no mass, no scars ?Skin: No rash ?Extremity Pulses:  2+ radial, femoral, dorsalis pedis,  pulses bilaterally ?Musculoskeletal: No deformity or edema  ?Neurologic: Upper and lower extremity motor 5/5 and symmetric ? ?DATA:  ?ABI Findings:  ?+---------+------------------+-----+---------+--------+  ?Right    Rt Pressure (mmHg)IndexWaveform Comment   ?+---------+------------------+-----+---------+--------+  ?Brachial 144                                        ?+---------+------------------+-----+---------+--------+  ?PTA      168               1.17 triphasic          ?+---------+------------------+-----+---------+--------+  ?DP       163               1.13 triphasic          ?+---------+------------------+-----+---------+--------+  ?Doristine Devoid [phone]  1.16                    ?+---------+------------------+-----+---------+--------+  ? ?+---------+------------------+-----+---------+-------+  ?Left     Lt Pressure (mmHg)IndexWaveform Comment  ?+---------+------------------+-----+---------+-------+  ?Brachial 144                                      ?+---------+------------------+-----+---------+-------+  ?PTA      178               1.24 triphasic         ?+---------+------------------+-----+---------+-------+  ?DP       170               1.18 biphasic          ?+---------+------------------+-----+---------+-------+  ?Great Toe164               1.14                   ?+---------+------------------+-----+---------+-------+  ? ?+-------+-----------+-----------+------------+------------+  ?ABI/TBIToday's ABIToday's TBIPrevious ABIPrevious TBI  ?+-------+-----------+-----------+------------+------------+  ?Right  1.17       1.16       1.16        0.79          ?+-------+-----------+-----------+------------+------------+  ?Left   1.24       1.14       1.18        0.82          ?+-------+-----------+-----------+------------+------------+  ? ?   ?Bilateral ABIs appear essentially unchanged compared to prior study on  ?11/01/2020.  ?   ?Summary:  ?Right: Resting right ankle-brachial index is within normal range. No  ?evidence of significant right lower extremity arterial disease. The right  ?toe-brachial index is normal.  ? ?Left: Resting left ankle-brachial index is within normal range. No  ?evidence of significant left lower extremity arterial disease. The left  ?toe-brachial index  is normal. ? ?Fem Fem Graft: Right to Left  ?+------------------+--------+--------+--------+--------+  ?                  PSV cm/sStenosisWaveformComments  ?+------------------+--------+--------+--------+--------+  ?Inflow            146             biphasic          ?+---------------

## 2021-11-08 ENCOUNTER — Ambulatory Visit
Admission: RE | Admit: 2021-11-08 | Discharge: 2021-11-08 | Disposition: A | Discharge status: Home / Self Care | Payer: Medicare Other | Source: Ambulatory Visit | Attending: General Surgery | Admitting: General Surgery

## 2021-11-08 ENCOUNTER — Other Ambulatory Visit: Payer: Self-pay

## 2021-11-08 DIAGNOSIS — K4090 Unilateral inguinal hernia, without obstruction or gangrene, not specified as recurrent: Secondary | ICD-10-CM

## 2021-11-08 DIAGNOSIS — K429 Umbilical hernia without obstruction or gangrene: Secondary | ICD-10-CM | POA: Diagnosis not present

## 2021-11-08 DIAGNOSIS — K4020 Bilateral inguinal hernia, without obstruction or gangrene, not specified as recurrent: Secondary | ICD-10-CM | POA: Diagnosis not present

## 2021-11-08 DIAGNOSIS — Z20822 Contact with and (suspected) exposure to covid-19: Secondary | ICD-10-CM | POA: Diagnosis not present

## 2021-11-13 ENCOUNTER — Ambulatory Visit (INDEPENDENT_AMBULATORY_CARE_PROVIDER_SITE_OTHER): Payer: Medicare Other | Admitting: Pulmonary Disease

## 2021-11-13 ENCOUNTER — Encounter: Payer: Self-pay | Admitting: Pulmonary Disease

## 2021-11-13 VITALS — BP 128/80 | HR 73 | Temp 98.1°F | Ht 72.0 in | Wt 211.8 lb

## 2021-11-13 DIAGNOSIS — J849 Interstitial pulmonary disease, unspecified: Secondary | ICD-10-CM | POA: Diagnosis not present

## 2021-11-13 DIAGNOSIS — Z5181 Encounter for therapeutic drug level monitoring: Secondary | ICD-10-CM | POA: Diagnosis not present

## 2021-11-13 DIAGNOSIS — J84112 Idiopathic pulmonary fibrosis: Secondary | ICD-10-CM

## 2021-11-13 DIAGNOSIS — I745 Embolism and thrombosis of iliac artery: Secondary | ICD-10-CM

## 2021-11-13 LAB — HEPATIC FUNCTION PANEL
ALT: 78 U/L — ABNORMAL HIGH (ref 0–53)
AST: 56 U/L — ABNORMAL HIGH (ref 0–37)
Albumin: 3.6 g/dL (ref 3.5–5.2)
Alkaline Phosphatase: 154 U/L — ABNORMAL HIGH (ref 39–117)
Bilirubin, Direct: 0.1 mg/dL (ref 0.0–0.3)
Total Bilirubin: 0.5 mg/dL (ref 0.2–1.2)
Total Protein: 6.9 g/dL (ref 6.0–8.3)

## 2021-11-13 MED ORDER — LOPERAMIDE HCL 2 MG PO CAPS: 2.0000 mg | ORAL_CAPSULE | ORAL | 2 refills | Status: DC | PRN

## 2021-11-13 NOTE — Patient Instructions (Addendum)
I am glad you are stable with your breathing ?Continue the Ofev ?We will check hepatic panel today ?Prescribe Imodium as needed for diarrhea ?Order high-res CT and PFTs in 3 months.  Follow-up in clinic in 3 months after these tests ?

## 2021-11-13 NOTE — Progress Notes (Addendum)
[name]    [id number]    [birth year]  Primary Care Physician:Pharr, Thayer Jew, MD  Referring Physician: Deland Pretty, MD [address] [place],  [place] [address]  Chief complaint:  Follow-up for interstitial lung disease, IPF Ofev started November 176  HPI: 76 year old former smoker referred to pulmonary for evaluation of interstitial lung disease States that he has mild dyspnea on exertion.  No cough. 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 Exposures: Exposed to dust.  No asbestos exposure.  No mold, hot tub, Jacuzzi.  No down pillows or comforters Smoking history: 20-pack-year smoker.  Quit in the 1990s Travel history: No significant travel history Relevant family history: No significant family history of lung disease  Interim history: Continues on Ofev.  States that he is tolerating it well except for occasional diarrhea. He had a brief interruption earlier this year due to treatment delay but now is back on regular use  Breathing is stable  Outpatient Encounter Medications as of 11/13/2021  Medication Sig   aspirin EC 81 MG tablet Take 81 mg by mouth daily. Swallow whole.   cholecalciferol (VITAMIN D) 25 MCG (1000 UT) tablet Take 1,000 Units by mouth at bedtime.    loratadine (CLARITIN) 10 MG tablet Take 1 tablet (10 mg total) by mouth daily.   Nintedanib (OFEV) 150 MG CAPS Take 1 capsule (150 mg total) by mouth 2 (two) times daily.   omeprazole (PRILOSEC) 10 MG capsule Take 10 mg by mouth every morning.   polycarbophil (FIBERCON) 625 MG tablet Take 1 tablet (625 mg total) by mouth 2 (two) times daily.   polyethylene glycol (MIRALAX / GLYCOLAX) 17 g packet Take 17 g by mouth daily.   rosuvastatin (CRESTOR) 10 MG tablet Take 10 mg by mouth daily.   sertraline (ZOLOFT) 50 MG tablet Take 50 mg by mouth daily as needed (anxiety).    [DISCONTINUED] acetaminophen (TYLENOL) 500 MG tablet Take 500 mg by mouth every 6 (six) hours as needed for headache (pain).   [DISCONTINUED] traMADol (ULTRAM) 50 MG tablet Take 1 tablet (50 mg total) by mouth every 6 (six) hours as needed for severe pain.   No facility-administered encounter medications on file as of 11/13/2021.    Physical Exam: Blood pressure 128/80, pulse 73, temperature 98.1 F (36.7 C), temperature source Oral, height 6' (1.829 m), weight 211 lb 12.8 oz (96.1 kg), SpO2 94 %. Gen:      No acute distress HEENT:  EOMI, sclera anicteric Neck:     No masses; no thyromegaly Lungs:    Bibasal crackles CV:         Regular rate and rhythm; no murmurs Abd:      + bowel sounds; soft, non-tender; no palpable masses, no distension Ext:    No edema; adequate peripheral perfusion Skin:      Warm and dry; no rash Neuro: alert and oriented x 3 Psych: normal mood and affect   Data Reviewed: Imaging: High-res CT 02/05/2020-groundglass attenuation with reticulation and traction bronchiectasis with basal gradient.  2 to 4 mm pulmonary nodules.  Probable UIP pattern.  High-resolution CT 02/06/2021-stable pattern of pulmonary fibrosis and probable UIP pattern.  Stable pulm nodules. I have reviewed the images personally.  PFTs: 04/11/2020 FVC 3.10 [69%], FEV1 2.56 [78%], F/F 83, TLC 4.75 [64%], DLCO 21.55 [81%] Moderate restriction  04/21/2021 FVC 2.68 [60%],  FEV1 2.29 [71%], F/F 85, TLC 4.36 [59%], DLCO 17.29 [66%] Moderate restriction, severe diffusion defect.  Worsening lung volumes and diffusion defect  Labs: IgE 02/02/2020-1344 Hypersensitivity panel 02/03/2020-negative ANA, CCP, rheumatoid factor 02/03/2020-negative  CTD serologies 04/11/2020-negative  Assessment:  Pulmonary fibrosis CT reviewed with probable UIP pattern No known exposures or CTD symptoms CTD serologies and exposure history is negative  Given progression on PFTs he is a candidate for treatment.  Discussed  options in detail including antifibrotic's and we have decided to proceed with Ofev which was started in November 2022.  He is tolerating it well except for occasional diarrhea.  We will give Imodium as needed Check monthly hepatic panel  Get high-res CT and PFTs and follow-up in 3 months  Plan/Recommendations: Continue Ofev Imodium as needed Monitor hepatic panel  Marshell Garfinkel MD [facility] 11/13/2021, 9:09 AM  CC: Deland Pretty, MD  Addendum: Received clinic note from Dr. Dossie Der MD, rheumatology dated 02/12/2022 He has been referred by Dr. Shelia Media for evaluation of inflammatory arthritis, shoulder pain and wrist pain x-rays showed osteoarthritis with inflammation of bilateral wrists.  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

## 2021-11-15 ENCOUNTER — Other Ambulatory Visit: Payer: Self-pay | Admitting: *Deleted

## 2021-11-15 DIAGNOSIS — Z5181 Encounter for therapeutic drug level monitoring: Secondary | ICD-10-CM

## 2021-11-21 DIAGNOSIS — M25531 Pain in right wrist: Secondary | ICD-10-CM | POA: Diagnosis not present

## 2021-11-21 DIAGNOSIS — M25511 Pain in right shoulder: Secondary | ICD-10-CM | POA: Diagnosis not present

## 2021-11-21 DIAGNOSIS — M25532 Pain in left wrist: Secondary | ICD-10-CM | POA: Diagnosis not present

## 2021-11-21 DIAGNOSIS — M542 Cervicalgia: Secondary | ICD-10-CM | POA: Diagnosis not present

## 2021-11-21 DIAGNOSIS — M2550 Pain in unspecified joint: Secondary | ICD-10-CM | POA: Diagnosis not present

## 2021-11-30 ENCOUNTER — Other Ambulatory Visit (INDEPENDENT_AMBULATORY_CARE_PROVIDER_SITE_OTHER): Payer: Medicare Other

## 2021-11-30 DIAGNOSIS — Z5181 Encounter for therapeutic drug level monitoring: Secondary | ICD-10-CM | POA: Diagnosis not present

## 2021-11-30 LAB — COMPREHENSIVE METABOLIC PANEL
ALT: 26 U/L (ref 0–53)
AST: 21 U/L (ref 0–37)
Albumin: 3.8 g/dL (ref 3.5–5.2)
Alkaline Phosphatase: 98 U/L (ref 39–117)
BUN: 23 mg/dL (ref 6–23)
CO2: 28 mEq/L (ref 19–32)
Calcium: 9.4 mg/dL (ref 8.4–10.5)
Chloride: 98 mEq/L (ref 96–112)
Creatinine, Ser: 1.08 mg/dL (ref 0.40–1.50)
GFR: 66.19 mL/min (ref >60.00)
Glucose, Bld: 114 mg/dL — ABNORMAL HIGH (ref 70–99)
Potassium: 4.5 mEq/L (ref 3.5–5.1)
Sodium: 133 mEq/L — ABNORMAL LOW (ref 135–145)
Total Bilirubin: 0.4 mg/dL (ref 0.2–1.2)
Total Protein: 6.9 g/dL (ref 6.0–8.3)

## 2021-12-06 ENCOUNTER — Other Ambulatory Visit: Payer: Self-pay

## 2021-12-06 ENCOUNTER — Emergency Department (HOSPITAL_BASED_OUTPATIENT_CLINIC_OR_DEPARTMENT_OTHER): Payer: Medicare Other

## 2021-12-06 ENCOUNTER — Emergency Department (HOSPITAL_BASED_OUTPATIENT_CLINIC_OR_DEPARTMENT_OTHER)
Admission: EM | Admit: 2021-12-06 | Discharge: 2021-12-06 | Disposition: A | Discharge status: Home / Self Care | Payer: Medicare Other | Attending: Emergency Medicine | Admitting: Emergency Medicine

## 2021-12-06 DIAGNOSIS — I2510 Atherosclerotic heart disease of native coronary artery without angina pectoris: Secondary | ICD-10-CM | POA: Diagnosis not present

## 2021-12-06 DIAGNOSIS — E785 Hyperlipidemia, unspecified: Secondary | ICD-10-CM | POA: Insufficient documentation

## 2021-12-06 DIAGNOSIS — Z7982 Long term (current) use of aspirin: Secondary | ICD-10-CM | POA: Diagnosis not present

## 2021-12-06 DIAGNOSIS — Z85828 Personal history of other malignant neoplasm of skin: Secondary | ICD-10-CM | POA: Diagnosis not present

## 2021-12-06 DIAGNOSIS — J84112 Idiopathic pulmonary fibrosis: Secondary | ICD-10-CM | POA: Insufficient documentation

## 2021-12-06 DIAGNOSIS — I10 Essential (primary) hypertension: Secondary | ICD-10-CM | POA: Diagnosis not present

## 2021-12-06 DIAGNOSIS — F10931 Alcohol use, unspecified with withdrawal delirium: Secondary | ICD-10-CM | POA: Insufficient documentation

## 2021-12-06 DIAGNOSIS — G5602 Carpal tunnel syndrome, left upper limb: Secondary | ICD-10-CM | POA: Insufficient documentation

## 2021-12-06 DIAGNOSIS — F17210 Nicotine dependence, cigarettes, uncomplicated: Secondary | ICD-10-CM | POA: Diagnosis not present

## 2021-12-06 DIAGNOSIS — I639 Cerebral infarction, unspecified: Secondary | ICD-10-CM | POA: Diagnosis not present

## 2021-12-06 DIAGNOSIS — R531 Weakness: Secondary | ICD-10-CM | POA: Diagnosis not present

## 2021-12-06 DIAGNOSIS — M79642 Pain in left hand: Secondary | ICD-10-CM | POA: Diagnosis present

## 2021-12-06 DIAGNOSIS — M25522 Pain in left elbow: Secondary | ICD-10-CM | POA: Diagnosis present

## 2021-12-06 DIAGNOSIS — R29818 Other symptoms and signs involving the nervous system: Secondary | ICD-10-CM | POA: Diagnosis not present

## 2021-12-06 DIAGNOSIS — I739 Peripheral vascular disease, unspecified: Secondary | ICD-10-CM | POA: Diagnosis not present

## 2021-12-06 LAB — DIFFERENTIAL
Abs Immature Granulocytes: 0.04 10*3/uL (ref 0.00–0.07)
Basophils Absolute: 0 10*3/uL (ref 0.0–0.1)
Basophils Relative: 0 %
Eosinophils Absolute: 0.2 10*3/uL (ref 0.0–0.5)
Eosinophils Relative: 2 %
Immature Granulocytes: 0 %
Lymphocytes Relative: 26 %
Lymphs Abs: 2.7 10*3/uL (ref 0.7–4.0)
Monocytes Absolute: 0.9 10*3/uL (ref 0.1–1.0)
Monocytes Relative: 9 %
Neutro Abs: 6.3 10*3/uL (ref 1.7–7.7)
Neutrophils Relative %: 63 %

## 2021-12-06 LAB — COMPREHENSIVE METABOLIC PANEL
ALT: 17 U/L (ref 0–44)
AST: 22 U/L (ref 15–41)
Albumin: 3.1 g/dL — ABNORMAL LOW (ref 3.5–5.0)
Alkaline Phosphatase: 89 U/L (ref 38–126)
Anion gap: 8 (ref 5–15)
BUN: 16 mg/dL (ref 8–23)
CO2: 24 mmol/L (ref 22–32)
Calcium: 8.4 mg/dL — ABNORMAL LOW (ref 8.9–10.3)
Chloride: 102 mmol/L (ref 98–111)
Creatinine, Ser: 0.92 mg/dL (ref 0.61–1.24)
GFR, Estimated: >60 mL/min (ref >60)
Glucose, Bld: 100 mg/dL — ABNORMAL HIGH (ref 70–99)
Potassium: 3.8 mmol/L (ref 3.5–5.1)
Sodium: 134 mmol/L — ABNORMAL LOW (ref 135–145)
Total Bilirubin: 0.5 mg/dL (ref 0.3–1.2)
Total Protein: 6.6 g/dL (ref 6.5–8.1)

## 2021-12-06 LAB — CBC
HCT: 36.3 % — ABNORMAL LOW (ref 39.0–52.0)
Hemoglobin: 12.7 g/dL — ABNORMAL LOW (ref 13.0–17.0)
MCH: 33.5 pg (ref 26.0–34.0)
MCHC: 35 g/dL (ref 30.0–36.0)
MCV: 95.8 fL (ref 80.0–100.0)
Platelets: 230 10*3/uL (ref 150–400)
RBC: 3.79 MIL/uL — ABNORMAL LOW (ref 4.22–5.81)
RDW: 13.8 % (ref 11.5–15.5)
WBC: 10.1 10*3/uL (ref 4.0–10.5)
nRBC: 0 % (ref 0.0–0.2)

## 2021-12-06 LAB — ETHANOL: Alcohol, Ethyl (B): <10 mg/dL (ref <10)

## 2021-12-06 LAB — CBG MONITORING, ED: Glucose-Capillary: 93 mg/dL (ref 70–99)

## 2021-12-06 LAB — APTT: aPTT: 29 seconds (ref 24–36)

## 2021-12-06 LAB — PROTIME-INR
INR: 1 (ref 0.8–1.2)
Prothrombin Time: 13.2 seconds (ref 11.4–15.2)

## 2021-12-06 MED ORDER — HYDROCODONE-ACETAMINOPHEN 5-325 MG PO TABS
1.0000 | ORAL_TABLET | Freq: Once | ORAL | Status: AC
  Administered 2021-12-06: 1 via ORAL
  Filled 2021-12-06: qty 1

## 2021-12-06 MED ORDER — HYDROCODONE-ACETAMINOPHEN 5-325 MG PO TABS: 1.0000 | ORAL_TABLET | Freq: Four times a day (QID) | ORAL | 0 refills | Status: DC | PRN

## 2021-12-06 NOTE — ED Provider Notes (Addendum)
?[facility] DEPART[facility] ?Provider Note ? ? ?CSN: [id number] ?Arrival date & time: 12/06/21  1925 ? ?  ? ?History ? ?Chief Complaint  ?Patient presents with  ? Code Stroke  ? ? ?[name] is a 76 y.o. male. ? ?Patient presenting with left upper extremity weakness and numbness.  Onset was at 1600 today.  Patient's daughter was there when it started.  May have started at 1630.  No speech no visual problems.  Patient feels as if it is improving some.  No prior history of any strokes.  Patient not on blood thinners.  Patient had the same thing happened yesterday afternoon sometime between 4 and 5 which was mostly numbness at that time.  He said it resolved over 3 to 4-hour period.  And he was completely back to normal.  Based on my exam there appears to be some weakness in the left upper extremity.  Patient states there is numbness.  Patient with possible CVA we will go ahead and activate code stroke.  Although with the events of yesterday probably not a TNK candidate.  However patient does have a history of arthritis.  And did complain of some pain in the left upper extremity.  But that is not unusual. ? ?Past medical history significant for idiopathic pulmonary fibrosis hyperlipidemia history of squamous cell carcinoma and basal cell carcinoma.  Past surgical history is significant for femorofemoral bypass graft in 2020 on the left.  Umbilical hernia repair in 2009 lumbar laminectomy rotator cuff repair on the right.  Also small intestine surgery in 2009 with small bowel resection mass removal and primary hernia repair.  Also had inguinal hernia repair in 2022.  Had abdominal wall mesh removal in 2009.  Past medical history alcohol use is 6 cans of beer per week.  And smokes 1 pack a day for 28 years. ? ? ?  ? ?Home Medications ?Prior to Admission medications   ?Medication Sig Start Date End Date Taking? Authorizing Provider  ?aspirin EC 81 MG tablet Take 81 mg by mouth daily. Swallow whole.     [provider]  ?cholecalciferol (VITAMIN D) 25 MCG (1000 UT) tablet Take 1,000 Units by mouth at bedtime.     [provider]  ?loperamide (IMODIUM) 2 MG capsule Take 1 capsule (2 mg total) by mouth as needed for diarrhea or loose stools. 11/13/21   Mannam, Hart Robinsons, MD  ?loratadine (CLARITIN) 10 MG tablet Take 1 tablet (10 mg total) by mouth daily. 01/01/21   Georgette Shell, MD  ?Nintedanib (OFEV) 150 MG CAPS Take 1 capsule (150 mg total) by mouth 2 (two) times daily. 05/26/21   Marshell Garfinkel, MD  ?omeprazole (PRILOSEC) 10 MG capsule Take 10 mg by mouth every morning. 12/01/20   [provider]  ?polycarbophil (FIBERCON) 625 MG tablet Take 1 tablet (625 mg total) by mouth 2 (two) times daily. 12/31/20   Georgette Shell, MD  ?polyethylene glycol (MIRALAX / GLYCOLAX) 17 g packet Take 17 g by mouth daily. 01/01/21   Georgette Shell, MD  ?rosuvastatin (CRESTOR) 10 MG tablet Take 10 mg by mouth daily. 09/27/20   [provider]  ?sertraline (ZOLOFT) 50 MG tablet Take 50 mg by mouth daily as needed (anxiety).    [provider]  ?   ? ?Allergies    ?Patient has no known allergies.   ? ?Review of Systems   ?Review of Systems  ?Constitutional:  Negative for chills and fever.  ?HENT:  Negative for ear pain and sore throat.   ?Eyes:  Negative for pain and visual disturbance.  ?Respiratory:  Negative for cough and shortness of breath.   ?Cardiovascular:  Negative for chest pain and palpitations.  ?Gastrointestinal:  Negative for abdominal pain and vomiting.  ?Genitourinary:  Negative for dysuria and hematuria.  ?Musculoskeletal:  Positive for arthralgias. Negative for back pain.  ?Skin:  Negative for color change and rash.  ?Neurological:  Positive for weakness and numbness. Negative for seizures, syncope, speech difficulty and headaches.  ?All other systems reviewed and are negative. ? ?Physical Exam ?Updated Vital Signs ?BP (!) 151/82 (BP Location: Right Arm)    Pulse 79   Temp 98.3 ?F (36.8 ?C)   Resp 20   Ht 1.829 m (6')   Wt 96.8 kg   SpO2 98%   BMI 28.94 kg/m?  ?Physical Exam ?Vitals and nursing note reviewed.  ?Constitutional:   ?   General: He is not in acute distress. ?   Appearance: Normal appearance. He is well-developed.  ?HENT:  ?   Head: Normocephalic and atraumatic.  ?Eyes:  ?   Conjunctiva/sclera: Conjunctivae normal.  ?   Pupils: Pupils are equal, round, and reactive to light.  ?Cardiovascular:  ?   Rate and Rhythm: Normal rate and regular rhythm.  ?   Heart sounds: No murmur heard. ?Pulmonary:  ?   Effort: Pulmonary effort is normal. No respiratory distress.  ?   Breath sounds: Normal breath sounds.  ?Abdominal:  ?   Palpations: Abdomen is soft.  ?   Tenderness: There is no abdominal tenderness.  ?Musculoskeletal:     ?   General: No swelling, tenderness or deformity.  ?   Cervical back: Normal range of motion and neck supple.  ?Skin: ?   General: Skin is warm and dry.  ?   Capillary Refill: Capillary refill takes less than 2 seconds.  ?Neurological:  ?   Mental Status: He is alert and oriented to person, place, and time.  ?   Sensory: Sensory deficit present.  ?   Motor: Weakness present.  ?   Comments: Patient with some subtle weakness to the left upper extremity compared to right upper extremity.  Grip strength is pretty good flexion weak.  ?Psychiatric:     ?   Mood and Affect: Mood normal.  ? ? ?ED Results / Procedures / Treatments   ?Labs ?(all labs ordered are listed, but only abnormal results are displayed) ?Labs Reviewed  ?RESP PANEL BY RT-PCR (FLU A&B, COVID) ARPGX2  ?ETHANOL  ?PROTIME-INR  ?APTT  ?CBC  ?DIFFERENTIAL  ?COMPREHENSIVE METABOLIC PANEL  ?RAPID URINE DRUG SCREEN, HOSP PERFORMED  ?URINALYSIS, ROUTINE W REFLEX MICROSCOPIC  ? ? ?EKG ?EKG Interpretation ? ?Date/Time:  Wednesday December 06 2021 19:32:49 EDT ?Ventricular Rate:  77 ?PR Interval:  180 ?QRS Duration: 109 ?QT Interval:  366 ?QTC Calculation: 415 ?R Axis:   -73 ?Text  Interpretation: Sinus rhythm Left anterior fascicular block Abnormal R-wave progression, early transition No significant change since last tracing Confirmed by Fredia Sorrow [phone]) on 12/06/2021 7:42:24 PM ? ?Radiology ?No results found. ? ?Procedures ?Procedures  ? ? ?Medications Ordered in ED ?Medications - No data to display ? ?ED Course/ Medical Decision Making/ A&P ?  ?                        ?Medical Decision Making ?Amount and/or Complexity of Data Reviewed ?Labs: ordered. ?Radiology: ordered. ? ?Risk ?Prescription drug  management. ? ? ?CRITICAL CARE ?Performed by: Fredia Sorrow ?Total critical care time: 45 minutes ?Critical care time was exclusive of separately billable procedures and treating other patients. ?Critical care was necessary to treat or prevent imminent or life-threatening deterioration. ?Critical care was time spent personally by me on the following activities: development of treatment plan with patient and/or surrogate as well as nursing, discussions with consultants, evaluation of patient's response to treatment, examination of patient, obtaining history from patient or surrogate, ordering and performing treatments and interventions, ordering and review of laboratory studies, ordering and review of radiographic studies, pulse oximetry and re-evaluation of patient's condition. ? ?Will activate code stroke.  Probably not a TNK candidate because of the events of yesterday.  Code stroke order set activated. ? ?Head CT without any acute findings was reassuring.  Patient seen by the teleneurologist.  Felt that symptoms may be secondary to his arthritis and carpal tunnel syndrome in the left hand.  Patient has follow-up with orthopedics actually for problems in his wrist on May 4.  We will going give a dose of pain medication here.  And probably discharge him with pain medicine.  The teleneurologist did not think he needed to go in for MRI.  But felt he needed an MRI as an outpatient with  precautions to come back for anything new or worse. ? ? ?Final Clinical Impression(s) / ED Diagnoses ?Final diagnoses:  ?Cerebrovascular accident (CVA), unspecified mechanism ([place])  ? ? ?Rx / DC Orders ?ED Discharge Orders

## 2021-12-06 NOTE — Consult Note (Signed)
[facility] ?[facility] ? ? ?Patient Name:   Mcgregor, Tinnon ?Date of Birth:   [birth year] ?Identification Number:   MRN - [id number] ?Date of Service:   12/06/2021 20:00:12 ? ?Diagnosis: ?      G56.02 - Carpal tunnel syndrome, left upper limb. ? ?Impression: ?     76 year old male who presents to the hospital because of burning shooting pain and numbness in his left hand. Presentation is likely secondary to peripheral nerve injury such as carpal tunnel. Given multiple risk factors recommend outpatient MRI to rule out recent stroke. If MRI can not be done within the week then recommend inpatient MRI. ? ?Our recommendations are outlined below. ? ?Recommendations: ? ?      Neuro Checks ?      Bedside Swallow Eval ?      DVT Prophylaxis ?      IV Fluids, Normal Saline ?      Head of Bed 30 Degrees ?      Euglycemia and Avoid Hyperthermia (PRN Acetaminophen) ?      Initiate or continue Aspirin 81 MG daily ? ? ?Sign Out: ?      Discussed with Emergency Department Provider ?-- Recommend outpatient EMG/NCS ? ? ?------------------------------------------------------------------------------ ? ?Advanced Imaging: ?Advanced Imaging Deferred because: ? ?Stroke not suspected with clinical presentation and exam ? ? ?Metrics: ?Last Known Well: 12/06/2021 16:00:00 ?[facility] Notification Time: 12/06/2021 20:00:12 ?Arrival Time: 12/06/2021 19:25:00 ?Stamp Time: 12/06/2021 20:00:12 ?Initial Response Time: 12/06/2021 20:04:48 ?Symptoms: left hand pain and tingling. ?NIHSS Start Assessment Time: 12/06/2021 20:07:11 ?Patient is not a candidate for Thrombolytic. ?Thrombolytic Medical Decision: 12/06/2021 20:06:00 ?Patient was not deemed candidate for Thrombolytic because of following reasons: ?No disabling symptoms. ?Other Diagnosis suspected. ? ?CT head showed no acute hemorrhage or acute core infarct. ? ?Primary Provider Notified of Diagnostic Impression and Management Plan on: 12/06/2021  20:30:22 ? ? ? ?------------------------------------------------------------------------------ ? ?History of Present Illness: ?Patient is a 76 year old Male. ? ?Patient was brought by private transportation with symptoms of left hand pain and tingling. ?77 year old male with a history of CAD, PAD, and HLD who presents to the hospital because of left hand pain and numbness and tingling. Patient reports at 4pm he had sudden onset of shoot pain in his left pain that radiated up to his elbow at times and described it as burning and shooting pain. On exam patient had decreased sensation on the palmar service of his hand. He reports a similar episode yesterday last 3-4 hours. ? ? ?Past Medical History: ?     Hypertension ?     Hyperlipidemia ? ?Medications: ? ?No Anticoagulant use  ?Antiplatelet use: Yes Aspirin ?Reviewed EMR for current medications ? ?Allergies:  ?Reviewed ? ?Social History: ?Smoking: No ? ?Family History: ? ?There is no family history of premature cerebrovascular disease pertinent to this consultation ? ?ROS : ?14 Points Review of Systems was performed and was negative except mentioned in HPI. ? ?Past Surgical History: ?There Is No Surgical History Contributory To Today?s Visit ? ? ? ?Examination: ?BP(151/82), Pulse(81), Blood Glucose(93) ?1A: Level of Consciousness - Alert; keenly responsive + 0 ?1B: Ask Month and Age - Both Questions Right + 0 ?1C: Blink Eyes & Squeeze Hands - Performs Both Tasks + 0 ?2: Test Horizontal Extraocular Movements - Normal + 0 ?3: Test Visual Fields - No Visual Loss + 0 ?4: Test Facial Palsy (Use Grimace if Obtunded) - Normal symmetry + 0 ?5A: Test Left Arm Motor  Drift - No Drift for 10 Seconds + 0 ?5B: Test Right Arm Motor Drift - No Drift for 10 Seconds + 0 ?6A: Test Left Leg Motor Drift - No Drift for 5 Seconds + 0 ?6B: Test Right Leg Motor Drift - No Drift for 5 Seconds + 0 ?7: Test Limb Ataxia (FNF/Heel-Shin) - No Ataxia + 0 ?8: Test Sensation - Mild-Moderate Loss:  Less Sharp/More Dull + 1 ?9: Test Language/Aphasia - Normal; No aphasia + 0 ?10: Test Dysarthria - Normal + 0 ?11: Test Extinction/Inattention - No abnormality + 0 ? ?NIHSS Score: 1 ? ?NIHSS Free Text : Decreased sensation on the ventromedial surface of the left forearm. Equal sensation on radial side as well as upper arm. Unable to hold an "OK" symbol with his left hand. ? ?Pre-Morbid Modified Rankin Scale: ?1 Points = No significant disability despite symptoms; able to carry out all usual duties and activities ? ? ?Patient/Family was informed the Neurology Consult would occur via TeleHealth consult by way of interactive audio and video telecommunications and consented to receiving care in this manner. ? ? ?Patient is being evaluated for possible acute neurologic impairment and high probability of imminent or life-threatening deterioration. I spent total of 30 minutes providing care to this patient, including time for face to face visit via telemedicine, review of medical records, imaging studies and discussion of findings with providers, the patient and/or family. ? ? ?Dr Tsosie Billing ? ? ?[facility] ?[phone] ? ? ?Case [id number] ? ?

## 2021-12-06 NOTE — Progress Notes (Signed)
Dr. Ramon Dredge at bedside via telestroke cart at 2004 ?

## 2021-12-06 NOTE — ED Triage Notes (Signed)
Patient arrived via POV c/o weakness and tingling in left arm starting at 1600 today. Patient states had similar episode yesterday with numbness and tingling that lasted for approximately 3 hrs before return to normal. Patient has noticeable weakness with left grip vs right. Patient is AO x 4, VS with elevated BP, slow gait. ?

## 2021-12-06 NOTE — Progress Notes (Addendum)
Code stroke activated at 16. LKW 1600. MRS 0.  ?

## 2021-12-06 NOTE — ED Notes (Signed)
Pt has slightly less sensation on lower LT arm; normal on upper; grip is slightly weaker in LT hand ?

## 2021-12-06 NOTE — Discharge Instructions (Addendum)
Take the pain medication as directed.  Follow-up with your primary care doctor for outpatient MRI.  Follow-up with orthopedics for the concerns for the arthritis in the left wrist that may be carpal tunnel syndrome.  Return for any new or worse symptoms particularly anything with vision changes speech changes weakness to upper extremity or lower extremity.  Or for severe headache. ?

## 2021-12-07 DIAGNOSIS — Z20822 Contact with and (suspected) exposure to covid-19: Secondary | ICD-10-CM | POA: Diagnosis not present

## 2021-12-14 DIAGNOSIS — G5603 Carpal tunnel syndrome, bilateral upper limbs: Secondary | ICD-10-CM | POA: Diagnosis not present

## 2021-12-14 DIAGNOSIS — G5602 Carpal tunnel syndrome, left upper limb: Secondary | ICD-10-CM | POA: Diagnosis not present

## 2021-12-19 ENCOUNTER — Telehealth: Payer: Self-pay | Admitting: Pulmonary Disease

## 2021-12-19 DIAGNOSIS — M8580 Other specified disorders of bone density and structure, unspecified site: Secondary | ICD-10-CM | POA: Diagnosis not present

## 2021-12-19 DIAGNOSIS — R748 Abnormal levels of other serum enzymes: Secondary | ICD-10-CM | POA: Diagnosis not present

## 2021-12-19 DIAGNOSIS — M8589 Other specified disorders of bone density and structure, multiple sites: Secondary | ICD-10-CM | POA: Diagnosis not present

## 2021-12-19 NOTE — Telephone Encounter (Signed)
ATC x1.  LVM to return call on 5/10 after 8 am. ?

## 2021-12-20 NOTE — Telephone Encounter (Signed)
Pt requesting bloodwork results. Please advise ?

## 2021-12-21 NOTE — Telephone Encounter (Signed)
Labs are stable with normal liver function. Continue current therapy ?

## 2021-12-21 NOTE — Telephone Encounter (Signed)
Pt is requesting blood work results from 11/30/21. Dr. Vaughan Browner please advise.  ?

## 2021-12-21 NOTE — Telephone Encounter (Signed)
Called and spoke with patient. He verbalized understanding of results. Nothing further needed at time of call.  

## 2022-01-11 DIAGNOSIS — G5603 Carpal tunnel syndrome, bilateral upper limbs: Secondary | ICD-10-CM | POA: Diagnosis not present

## 2022-01-18 DIAGNOSIS — R200 Anesthesia of skin: Secondary | ICD-10-CM | POA: Diagnosis not present

## 2022-01-29 DIAGNOSIS — R630 Anorexia: Secondary | ICD-10-CM | POA: Diagnosis not present

## 2022-01-29 DIAGNOSIS — M79642 Pain in left hand: Secondary | ICD-10-CM | POA: Diagnosis not present

## 2022-01-29 DIAGNOSIS — I959 Hypotension, unspecified: Secondary | ICD-10-CM | POA: Diagnosis not present

## 2022-01-29 DIAGNOSIS — R918 Other nonspecific abnormal finding of lung field: Secondary | ICD-10-CM | POA: Diagnosis not present

## 2022-01-29 DIAGNOSIS — E778 Other disorders of glycoprotein metabolism: Secondary | ICD-10-CM | POA: Diagnosis not present

## 2022-01-29 DIAGNOSIS — M1990 Unspecified osteoarthritis, unspecified site: Secondary | ICD-10-CM | POA: Diagnosis not present

## 2022-01-29 DIAGNOSIS — M79641 Pain in right hand: Secondary | ICD-10-CM | POA: Diagnosis not present

## 2022-01-29 DIAGNOSIS — E871 Hypo-osmolality and hyponatremia: Secondary | ICD-10-CM | POA: Diagnosis not present

## 2022-02-05 DIAGNOSIS — E778 Other disorders of glycoprotein metabolism: Secondary | ICD-10-CM | POA: Diagnosis not present

## 2022-02-05 DIAGNOSIS — I951 Orthostatic hypotension: Secondary | ICD-10-CM | POA: Diagnosis not present

## 2022-02-07 DIAGNOSIS — M25439 Effusion, unspecified wrist: Secondary | ICD-10-CM | POA: Diagnosis not present

## 2022-02-07 DIAGNOSIS — M25539 Pain in unspecified wrist: Secondary | ICD-10-CM | POA: Diagnosis not present

## 2022-02-07 DIAGNOSIS — M25531 Pain in right wrist: Secondary | ICD-10-CM | POA: Diagnosis not present

## 2022-02-07 DIAGNOSIS — M1990 Unspecified osteoarthritis, unspecified site: Secondary | ICD-10-CM | POA: Diagnosis not present

## 2022-02-07 DIAGNOSIS — M25511 Pain in right shoulder: Secondary | ICD-10-CM | POA: Diagnosis not present

## 2022-02-12 DIAGNOSIS — M1990 Unspecified osteoarthritis, unspecified site: Secondary | ICD-10-CM | POA: Diagnosis not present

## 2022-02-12 DIAGNOSIS — M25439 Effusion, unspecified wrist: Secondary | ICD-10-CM | POA: Diagnosis not present

## 2022-02-12 DIAGNOSIS — M25511 Pain in right shoulder: Secondary | ICD-10-CM | POA: Diagnosis not present

## 2022-02-12 DIAGNOSIS — M25532 Pain in left wrist: Secondary | ICD-10-CM | POA: Diagnosis not present

## 2022-02-20 ENCOUNTER — Ambulatory Visit (HOSPITAL_BASED_OUTPATIENT_CLINIC_OR_DEPARTMENT_OTHER)
Admission: RE | Admit: 2022-02-20 | Discharge: 2022-02-20 | Disposition: A | Discharge status: Home / Self Care | Payer: Medicare Other | Source: Ambulatory Visit | Attending: Pulmonary Disease | Admitting: Pulmonary Disease

## 2022-02-20 DIAGNOSIS — J84112 Idiopathic pulmonary fibrosis: Secondary | ICD-10-CM | POA: Diagnosis not present

## 2022-02-20 DIAGNOSIS — J849 Interstitial pulmonary disease, unspecified: Secondary | ICD-10-CM | POA: Insufficient documentation

## 2022-02-26 ENCOUNTER — Telehealth: Payer: Self-pay | Admitting: Pulmonary Disease

## 2022-02-26 ENCOUNTER — Ambulatory Visit: Payer: Medicare Other | Admitting: Pulmonary Disease

## 2022-02-26 NOTE — Telephone Encounter (Signed)
We can hold off on the PFTs and keep the office visit.

## 2022-02-26 NOTE — Telephone Encounter (Signed)
Contacted patient and advised to cancel PFT, but still come in at 12pm on 03/01/2022 to see PM.

## 2022-02-26 NOTE — Telephone Encounter (Signed)
Dr. Vaughan Browner, please advise if you still want pt to keep PFT appt with him being on prednisone for additional 10 days or if you want him to cancel the PFT until after he is off of the prednisone. Also, if you want pt to cancel the PFT, please advise if you want him to keep OV with you or if you want him to cancel the OV until after PFT can be performed.

## 2022-02-26 NOTE — Telephone Encounter (Signed)
Patient states he is on prednisone and will be on it for another 10 days which makes him short of breath- wants to know if he should still come in for the PFT or if he just needs to come in to see Dr. Vaughan Browner.   Please advise. Call back number [phone]

## 2022-02-27 ENCOUNTER — Other Ambulatory Visit: Payer: Self-pay | Admitting: *Deleted

## 2022-02-27 DIAGNOSIS — I272 Pulmonary hypertension, unspecified: Secondary | ICD-10-CM

## 2022-03-01 ENCOUNTER — Ambulatory Visit (INDEPENDENT_AMBULATORY_CARE_PROVIDER_SITE_OTHER): Payer: Medicare Other | Admitting: Pulmonary Disease

## 2022-03-01 ENCOUNTER — Encounter: Payer: Self-pay | Admitting: Pulmonary Disease

## 2022-03-01 VITALS — BP 140/80 | HR 72 | Temp 98.0°F | Ht 72.0 in | Wt 189.2 lb

## 2022-03-01 DIAGNOSIS — Z5181 Encounter for therapeutic drug level monitoring: Secondary | ICD-10-CM

## 2022-03-01 DIAGNOSIS — R0602 Shortness of breath: Secondary | ICD-10-CM | POA: Diagnosis not present

## 2022-03-01 DIAGNOSIS — J84112 Idiopathic pulmonary fibrosis: Secondary | ICD-10-CM | POA: Diagnosis not present

## 2022-03-01 DIAGNOSIS — I745 Embolism and thrombosis of iliac artery: Secondary | ICD-10-CM

## 2022-03-01 LAB — HEPATIC FUNCTION PANEL
ALT: 27 U/L (ref 0–53)
AST: 22 U/L (ref 0–37)
Albumin: 3.8 g/dL (ref 3.5–5.2)
Alkaline Phosphatase: 87 U/L (ref 39–117)
Bilirubin, Direct: 0.2 mg/dL (ref 0.0–0.3)
Total Bilirubin: 0.6 mg/dL (ref 0.2–1.2)
Total Protein: 6.4 g/dL (ref 6.0–8.3)

## 2022-03-01 NOTE — Progress Notes (Signed)
[name]    [id number]    1946-03-11  Primary Care Physician:Pharr, Thayer Jew, MD  Referring Physician: Deland Pretty, MD [address] [place],  [place] [address]  Chief complaint:  Follow-up for interstitial lung disease, IPF Ofev started November 3368  HPI: 76 year old former smoker referred to pulmonary for evaluation of interstitial lung disease States that he has mild dyspnea on exertion.  No cough. 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 Exposures: Exposed to dust.  No asbestos exposure.  No mold, hot tub, Jacuzzi.  No down pillows or comforters Smoking history: 20-pack-year smoker.  Quit in the 1990s Travel history: No significant travel history Relevant family history: No significant family history of lung disease  Interim history: Continues on Ofev.  States that he is tolerating it well except for occasional diarrhea. Recently evaluated at [facility] rheumatology for joint pain and is on prednisone taper.  He was supposed to get PFTs but wanted to reschedule it as prednisone makes him jittery and he cannot give adequate effort.  ---------------------- Received clinic note from Dr. Dossie Der MD, rheumatology dated 02/12/2022 He has been referred by Dr. Shelia Media for evaluation of inflammatory arthritis, shoulder pain and wrist pain x-rays showed osteoarthritis with inflammation of bilateral wrists.  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  He has been given local steroid injection and possibly needs oral steroids CCP, CRP, sed rate and rheumatoid factor and uric acid ordered.  Outpatient Encounter Medications as of 03/01/2022  Medication Sig   aspirin EC 81 MG tablet Take 81 mg by mouth daily. Swallow whole.   cholecalciferol (VITAMIN D) 25 MCG (1000 UT) tablet Take 1,000 Units by mouth at bedtime.    loperamide (IMODIUM) 2 MG  capsule Take 1 capsule (2 mg total) by mouth as needed for diarrhea or loose stools.   loratadine (CLARITIN) 10 MG tablet Take 1 tablet (10 mg total) by mouth daily.   Nintedanib (OFEV) 150 MG CAPS Take 1 capsule (150 mg total) by mouth 2 (two) times daily.   omeprazole (PRILOSEC) 10 MG capsule Take 10 mg by mouth every morning.   polycarbophil (FIBERCON) 625 MG tablet Take 1 tablet (625 mg total) by mouth 2 (two) times daily.   polyethylene glycol (MIRALAX / GLYCOLAX) 17 g packet Take 17 g by mouth daily.   rosuvastatin (CRESTOR) 10 MG tablet Take 10 mg by mouth daily.   sertraline (ZOLOFT) 50 MG tablet Take 50 mg by mouth daily as needed (anxiety).   [DISCONTINUED] HYDROcodone-acetaminophen (NORCO/VICODIN) 5-325 MG tablet Take 1 tablet by mouth every 6 (six) hours as needed for moderate pain.   No facility-administered encounter medications on file as of 03/01/2022.    Physical Exam: Blood pressure 140/80, pulse 72, temperature 98 F (36.7 C), temperature source Oral, height 6' (1.829 m), weight 189 lb 3.2 oz (85.8 kg), SpO2 98 %. Gen:      No acute distress HEENT:  EOMI, sclera anicteric Neck:     No masses; no thyromegaly Lungs:    Clear to auscultation bilaterally; normal respiratory effort CV:         Regular rate and rhythm; no murmurs Abd:      + bowel sounds; soft, non-tender; no palpable masses, no distension Ext:    No edema; adequate peripheral perfusion Skin:      Warm and dry; no rash  Neuro: alert and oriented x 3 Psych: normal mood and affect   Data Reviewed: Imaging: High-res CT 02/05/2020-groundglass attenuation with reticulation and traction bronchiectasis with basal gradient.  2 to 4 mm pulmonary nodules.  Probable UIP pattern.  High-resolution CT 02/06/2021-stable pattern of pulmonary fibrosis and probable UIP pattern.  Stable pulm nodules.  High-res CT 02/20/2022-slight interval progression with honeycombing.  Probable UIP. I have reviewed the images  personally.  PFTs: 04/11/2020 FVC 3.10 [69%], FEV1 2.56 [78%], F/F 83, TLC 4.75 [64%], DLCO 21.55 [81%] Moderate restriction  04/21/2021 FVC 2.68 [60%], FEV1 2.29 [71%], F/F 85, TLC 4.36 [59%], DLCO 17.29 [66%] Moderate restriction, severe diffusion defect.  Worsening lung volumes and diffusion defect  Labs: IgE 02/02/2020-1344 Hypersensitivity panel 02/03/2020-negative ANA, CCP, rheumatoid factor 02/03/2020-negative  CTD serologies 04/11/2020-negative  Assessment:  Pulmonary fibrosis CT reviewed with probable UIP pattern No known exposures or CTD symptoms CTD serologies and exposure history is negative  Given progression on PFTs he is a candidate for treatment.  Discussed options in detail including antifibrotic's and we have decided to proceed with Ofev which was started in November 2022.  He is tolerating it well except for occasional diarrhea.  We will give Imodium as needed CT reviewed with mild progression which is expected for the disease Hepatic panel and proBNP for dyspnea PFTs in 3 months and follow-up in clinic in 3 months  Enlarged pulmonary artery Echocardiogram has been ordered for evaluation of pulmonary hypertension  Plan/Recommendations: Continue Ofev Imodium as needed Monitor hepatic panel, proBNP  Marshell Garfinkel MD [facility] 03/01/2022, 12:05 PM  CC: Deland Pretty, MD

## 2022-03-01 NOTE — Patient Instructions (Signed)
Glad you are stable with your breathing Your CT scan does show mild worsening of scarring in the lung We will check hepatic panel today and proBNP for dyspnea You already have an echocardiogram scheduled We will reschedule PFTs in 3 months I will follow back with you in 3 months

## 2022-03-02 LAB — PRO B NATRIURETIC PEPTIDE: NT-Pro BNP: 518 pg/mL — ABNORMAL HIGH (ref 0–486)

## 2022-03-13 ENCOUNTER — Ambulatory Visit (HOSPITAL_BASED_OUTPATIENT_CLINIC_OR_DEPARTMENT_OTHER)
Admission: RE | Admit: 2022-03-13 | Discharge: 2022-03-13 | Disposition: A | Discharge status: Home / Self Care | Payer: Medicare Other | Source: Ambulatory Visit | Attending: Pulmonary Disease | Admitting: Pulmonary Disease

## 2022-03-13 DIAGNOSIS — I272 Pulmonary hypertension, unspecified: Secondary | ICD-10-CM | POA: Diagnosis not present

## 2022-03-14 LAB — ECHOCARDIOGRAM COMPLETE
AR max vel: 2.96 cm2
AV Area VTI: 3.26 cm2
AV Area mean vel: 2.92 cm2
AV Mean grad: 3 mmHg
AV Peak grad: 5.6 mmHg
Ao pk vel: 1.18 m/s
Area-P 1/2: 2.66 cm2
S' Lateral: 2.2 cm

## 2022-03-16 ENCOUNTER — Telehealth: Payer: Self-pay | Admitting: Pulmonary Disease

## 2022-03-16 DIAGNOSIS — I272 Pulmonary hypertension, unspecified: Secondary | ICD-10-CM

## 2022-03-16 NOTE — Telephone Encounter (Signed)
Called patient and he is wanting to know about the results of his echocardiogram. He states that he had it on 03/13/2022  Please advise sir

## 2022-03-16 NOTE — Telephone Encounter (Signed)
Pt returned call. Let pt know the info per Dr. Vaughan Browner and he verbalized understanding. Appt scheduled for pt with [facility].nothing further needed.

## 2022-03-16 NOTE — Telephone Encounter (Signed)
Please stop Ofev and make a sooner appointment with me or APP to evalaute

## 2022-03-16 NOTE — Telephone Encounter (Signed)
Called and spoke with patient. Patient verbalized understanding. Referral to cardiology was placed.  Patient also wanted me to ask if he could stop taking OFEV. Patient says since he's started the OFEV, he's had blood in his stool.   PM, please advise.

## 2022-03-16 NOTE — Telephone Encounter (Signed)
Attempted to call pt but unable to reach. Left message for him to return call. °

## 2022-03-16 NOTE — Telephone Encounter (Signed)
Please let patient know that echocardiogram shows mild reduction in right heart function which may indicate a condition called pulmonary hypertension as a result of the pulmonary fibrosis Make referral to cardiology for evaluation for a procedure called right heart catheterization to confirm this

## 2022-03-19 DIAGNOSIS — K625 Hemorrhage of anus and rectum: Secondary | ICD-10-CM | POA: Diagnosis not present

## 2022-03-19 DIAGNOSIS — R634 Abnormal weight loss: Secondary | ICD-10-CM | POA: Diagnosis not present

## 2022-03-19 DIAGNOSIS — R42 Dizziness and giddiness: Secondary | ICD-10-CM | POA: Diagnosis not present

## 2022-03-20 DIAGNOSIS — M25539 Pain in unspecified wrist: Secondary | ICD-10-CM | POA: Diagnosis not present

## 2022-03-20 DIAGNOSIS — M1990 Unspecified osteoarthritis, unspecified site: Secondary | ICD-10-CM | POA: Diagnosis not present

## 2022-03-20 DIAGNOSIS — M059 Rheumatoid arthritis with rheumatoid factor, unspecified: Secondary | ICD-10-CM | POA: Diagnosis not present

## 2022-03-20 DIAGNOSIS — M25511 Pain in right shoulder: Secondary | ICD-10-CM | POA: Diagnosis not present

## 2022-03-20 DIAGNOSIS — M25439 Effusion, unspecified wrist: Secondary | ICD-10-CM | POA: Diagnosis not present

## 2022-03-26 ENCOUNTER — Encounter: Payer: Self-pay | Admitting: Nurse Practitioner

## 2022-03-26 ENCOUNTER — Ambulatory Visit (INDEPENDENT_AMBULATORY_CARE_PROVIDER_SITE_OTHER): Payer: Medicare Other | Admitting: Nurse Practitioner

## 2022-03-26 DIAGNOSIS — K921 Melena: Secondary | ICD-10-CM | POA: Insufficient documentation

## 2022-03-26 DIAGNOSIS — J84112 Idiopathic pulmonary fibrosis: Secondary | ICD-10-CM

## 2022-03-26 DIAGNOSIS — I272 Pulmonary hypertension, unspecified: Secondary | ICD-10-CM

## 2022-03-26 HISTORY — DX: Pulmonary hypertension, unspecified: I27.20

## 2022-03-26 HISTORY — DX: Melena: K92.1

## 2022-03-26 NOTE — Assessment & Plan Note (Addendum)
Evidence of right-sided diastolic dysfunction and reduced TAPSE, concerning for underlying ILD PAH.  He was referred to cardiology by Dr. Vaughan Browner and has appointment in October to discuss right heart catheterization for further assessment.  He has prioritized his appointment with GI and colonoscopy first, understandably so, so felt that October would be best timing for him.

## 2022-03-26 NOTE — Progress Notes (Signed)
$'@Patient'P$  ID: [name], male    DOB: 11/19/1945, 76 y.o.   MRN: [id number]  Chief Complaint  Patient presents with   Follow-up    Referring provider: Deland Pretty, MD  HPI: 76 year old male, former smoker followed for IPF.  He is a patient of Dr. Matilde Bash and last seen in office 03/01/2022.  Past medical history significant for PVD, allergic rhinitis, history of asthma, GERD, arthritis, GAD, and history of incarcerated hernia status postrepair.  TEST/EVENTS:  04/21/2021 PFTs: FVC 62, FEV1 71, ratio 85, TLC 59, DLCO 66 02/20/2022 HRCT chest: Atherosclerosis is present.  Dilated right and left pulmonary arteries.  No LAD.  Subpleural and basilar predominant reticulation, groundglass, traction bronchiectasis/bronchiolectasis and honeycombing consistent with UIP.  Mildly progressive from 2022.  There are calcified granulomas.  No air trapping. 03/13/2022: EF 6065%.  Mild LVH.  G1 DD.  Reduced TAPSE.  RV systolic function is mildly reduced.  03/01/2022: OV with Dr. Vaughan Browner for follow-up.  Continues on Ofev.  Tolerating it well except for occasional diarrhea.  [facility] evaluated [facility] for [facility] pain and is on prednisone taper.  He was post to get PFTs but wanted to reschedule it as prednisone makes him jittery and he cannot give adequate effort.  Reviewed clinic note from Dr. Dossie Der with rheumatology from earlier this month.  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  Possibly will need oral steroids.  Plan for repeat PFTs in 3 months.  Continued on Ofev daily and Imodium as needed.  Echocardiogram for evaluation of pulmonary hypertension; which showed some right heart strain concerning for pulmonary hypertension.  Referred to cardiology for right heart cath.  03/26/2022: Today - acute Patient presents today for acute visit. He had contacted the office on 8/4 due to some blood in his stool.  He was worried this was related to the Ofev.  He  was instructed to stop Ofev and schedule acute visit in the office.  Today, he reports that he has been off the Ofev for around 10 days now.  He is still having blood in his stools for which she has an appointment next week with GI to discuss colonoscopy.  Has not noticed any significant change since stopping the Ofev.  He denies any lightheadedness or dizziness, nausea/vomiting, abdominal pain, anorexia.  He has had some weight loss over the past few months of around 20 pounds.  He would like to stay off the Ofev for now until he figures out exactly what is going on.  Reports that his breathing overall is stable.  He does get winded with long distances and does not do as much as he used to; however, overall the symptoms are stable for quite some time now.  He was seen by Dr. Dossie Der with rheumatology since we saw him last and has been placed on methotrexate for rheumatoid arthritis with positive RA factor.  He does feel like this has started to help his joint symptoms some.  His recent echocardiogram showed some mild RV dysfunction with reduced TAPSE.  He was referred to cardiology for possible right heart cath and will see them on 10/5.  No Known Allergies  Immunization History  Administered Date(s) Administered   Fluad Quad(high Dose 65+) 05/13/2019, 07/12/2020, 08/25/2021   Influenza, High Dose Seasonal PF 05/26/2015, 05/07/2016   Influenza, Quadrivalent, Recombinant, Inj, Pf 06/18/2017, 06/30/2018, 07/07/2019   Influenza-Unspecified 07/20/2013, 04/27/2014, 06/13/2018   PFIZER Comirnaty(Gray Top)Covid-19 Tri-Sucrose Vaccine 09/13/2020   PFIZER(Purple  Top)SARS-COV-2 Vaccination 09/26/2019, 10/18/2019   Pneumococcal Conjugate-13 04/22/2014   Pneumococcal-Unspecified 10/18/2009   Tdap 09/17/2006, 06/04/2016   Zoster, Live 09/17/2006    Past Medical History:  Diagnosis Date   Basal cell carcinoma 02/21/2011   left inner cheek, lateral (CX35FU)   Depression    GERD (gastroesophageal reflux  disease)    Hyperlipidemia    Osteopenia    Squamous cell carcinoma of skin 07/21/2019   in situ- fornt center scalp (CX35FU)   Squamous cell carcinoma, face 12/13/2018   in situ- left forehead(CX35FU)    Tobacco History: Social History   Tobacco Use  Smoking Status Former   Packs/day: 1.00   Years: 28.00   Total pack years: 28.00   Types: Cigarettes  Smokeless Tobacco Never  Tobacco Comments   quit 1992   Counseling given: Not Answered Tobacco comments: quit 1992   Outpatient Medications Prior to Visit  Medication Sig Dispense Refill   aspirin EC 81 MG tablet Take 81 mg by mouth daily. Swallow whole.     cholecalciferol (VITAMIN D) 25 MCG (1000 UT) tablet Take 1,000 Units by mouth at bedtime.      loratadine (CLARITIN) 10 MG tablet Take 1 tablet (10 mg total) by mouth daily.     omeprazole (PRILOSEC) 10 MG capsule Take 10 mg by mouth every morning.     polycarbophil (FIBERCON) 625 MG tablet Take 1 tablet (625 mg total) by mouth 2 (two) times daily.     rosuvastatin (CRESTOR) 10 MG tablet Take 10 mg by mouth daily.     sertraline (ZOLOFT) 50 MG tablet Take 50 mg by mouth daily as needed (anxiety).     loperamide (IMODIUM) 2 MG capsule Take 1 capsule (2 mg total) by mouth as needed for diarrhea or loose stools. (Patient not taking: Reported on 03/26/2022) 30 capsule 2   Nintedanib (OFEV) 150 MG CAPS Take 1 capsule (150 mg total) by mouth 2 (two) times daily. (Patient not taking: Reported on 03/26/2022) 90 capsule 0   polyethylene glycol (MIRALAX / GLYCOLAX) 17 g packet Take 17 g by mouth daily. (Patient not taking: Reported on 03/26/2022) 14 each 0   No facility-administered medications prior to visit.     Review of Systems:   Constitutional: + weight loss, fatigue. No night sweats, fevers, chills, lassitude. HEENT: No headaches, difficulty swallowing, tooth/dental problems, or sore throat. No sneezing, itching, ear ache, nasal congestion, or post nasal drip CV:  No chest  pain, orthopnea, PND, swelling in lower extremities, anasarca, dizziness, palpitations, syncope Resp: +shortness of breath with exertion (baseline); dry cough (baseline). No excess mucus or change in color of mucus. No productive or non-productive. No hemoptysis. No wheezing.  No chest wall deformity GI:  +rectal bleeding, bloody stools. No heartburn, indigestion, abdominal pain, nausea, vomiting, diarrhea, loss of appetite GU: No dysuria, change in color of urine, urgency or frequency.  No flank pain, no hematuria  Skin: No rash, lesions, ulcerations MSK:  +chronic joint pain.  No decreased range of motion.  No back pain. Neuro: No dizziness or lightheadedness.  Psych: No depression or anxiety. Mood stable.     Physical Exam:  BP 120/82 (BP Location: Right Arm, Cuff Size: Normal)   Pulse 81   Temp 98.4 F (36.9 C) (Oral)   Ht 6' (1.829 m)   Wt 190 lb 3.2 oz (86.3 kg)   SpO2 97%   BMI 25.80 kg/m   GEN: Pleasant, interactive, well-appearing; in no acute distress HEENT:  Normocephalic and  atraumatic.  PERRLA. Sclera white. Nasal turbinates pink, moist and patent bilaterally. No rhinorrhea present. Oropharynx pink and moist, without exudate or edema. No lesions, ulcerations, or postnasal drip.  NECK:  Supple w/ fair ROM.  Thyroid symmetrical with no goiter or nodules palpated. No lymphadenopathy.   CV: RRR, no m/r/g, no peripheral edema. Pulses intact, +2 bilaterally. No cyanosis, pallor or clubbing. PULMONARY:  Unlabored, regular breathing. Clear bilaterally A&P w/o wheezes/rales/rhonchi. No accessory muscle use. No dullness to percussion. GI: BS present and normoactive. Soft, non-tender to palpation. No organomegaly or masses detected. No CVA tenderness. MSK: No erythema, warmth or tenderness. Cap refil <2 sec all extrem. Neuro: A/Ox3. No focal deficits noted.   Skin: Warm, no lesions or rashe Psych: Normal affect and behavior. Judgement and thought content appropriate.     Lab  Results:  CBC    Component Value Date/Time   WBC 10.1 12/06/2021 1956   RBC 3.79 (L) 12/06/2021 1956   HGB 12.7 (L) 12/06/2021 1956   HCT 36.3 (L) 12/06/2021 1956   PLT 230 12/06/2021 1956   MCV 95.8 12/06/2021 1956   MCH 33.5 12/06/2021 1956   MCHC 35.0 12/06/2021 1956   RDW 13.8 12/06/2021 1956   LYMPHSABS 2.7 12/06/2021 1956   MONOABS 0.9 12/06/2021 1956   EOSABS 0.2 12/06/2021 1956   BASOSABS 0.0 12/06/2021 1956    BMET    Component Value Date/Time   NA 134 (L) 12/06/2021 1956   K 3.8 12/06/2021 1956   CL 102 12/06/2021 1956   CO2 24 12/06/2021 1956   GLUCOSE 100 (H) 12/06/2021 1956   BUN 16 12/06/2021 1956   CREATININE 0.92 12/06/2021 1956   CALCIUM 8.4 (L) 12/06/2021 1956   GFRNONAA >60 12/06/2021 1956   GFRAA >60 02/02/2019 1520    BNP No results found for: "BNP"   Imaging:  ECHOCARDIOGRAM COMPLETE  Result Date: 03/14/2022    ECHOCARDIOGRAM REPORT   Patient Name:   HENRICK MCGUE Date of Exam: 03/13/2022 Medical Rec #:  [id number]         Height:       72.0 in Accession #:    [id number]        Weight:       189.2 lb Date of Birth:  1946/06/30        BSA:          2.081 m Patient Age:    39 years          BP:           108/73 mmHg Patient Gender: M                 HR:           76 bpm. Exam Location:  [facility] Procedure: 2D Echo, Cardiac Doppler, Color Doppler and 3D Echo Indications:    Pulmonary hypertension  History:        Patient has prior history of Echocardiogram examinations, most                 recent 02/18/2020. Signs/Symptoms:Dyspnea; Risk Factors:Former                 Smoker and Dyslipidemia.  Sonographer:    Merrie Roof RDCS Referring Phys: [id number] [place]  1. Left ventricular ejection fraction, by estimation, is 60 to 65%. The left ventricle has normal function. The left ventricle has no regional wall motion abnormalities. There is mild concentric left ventricular hypertrophy. Left ventricular diastolic  parameters are consistent with  Grade I diastolic dysfunction (impaired relaxation).  2. Reduced TAPSE. Right ventricular systolic function is mildly reduced. The right ventricular size is normal.  3. The mitral valve is degenerative. Mild mitral valve regurgitation. No evidence of mitral stenosis.  4. TR velocity is not elevated.  5. The aortic valve is normal in structure. Aortic valve regurgitation is trivial. Aortic valve sclerosis/calcification is present, without any evidence of aortic stenosis. FINDINGS  Left Ventricle: Left ventricular ejection fraction, by estimation, is 60 to 65%. The left ventricle has normal function. The left ventricle has no regional wall motion abnormalities. The left ventricular internal cavity size was normal in size. There is  mild concentric left ventricular hypertrophy. Left ventricular diastolic parameters are consistent with Grade I diastolic dysfunction (impaired relaxation). Right Ventricle: Reduced TAPSE. The right ventricular size is normal. No increase in right ventricular wall thickness. Right ventricular systolic function is mildly reduced. Left Atrium: Left atrial size was normal in size. Right Atrium: Right atrial size was normal in size. Pericardium: There is no evidence of pericardial effusion. Mitral Valve: The mitral valve is degenerative in appearance. Mild mitral annular calcification. Mild mitral valve regurgitation. No evidence of mitral valve stenosis. Tricuspid Valve: TR velocity is not elevated. The tricuspid valve is normal in structure. Tricuspid valve regurgitation is mild . No evidence of tricuspid stenosis. Aortic Valve: The aortic valve is normal in structure. Aortic valve regurgitation is trivial. Aortic valve sclerosis/calcification is present, without any evidence of aortic stenosis. Aortic valve mean gradient measures 3.0 mmHg. Aortic valve peak gradient measures 5.6 mmHg. Aortic valve area, by VTI measures 3.26 cm. Pulmonic Valve: The pulmonic valve was normal in structure.  Pulmonic valve regurgitation is trivial. No evidence of pulmonic stenosis. Aorta: The aortic root and ascending aorta are structurally normal, with no evidence of dilitation and the aortic arch was not well visualized. Venous: A normal flow pattern is recorded from the right upper pulmonary vein. The inferior vena cava was not well visualized. IAS/Shunts: No atrial level shunt detected by color flow Doppler.  LEFT VENTRICLE PLAX 2D LVIDd:         3.20 cm   Diastology LVIDs:         2.20 cm   LV e' medial:    4.57 cm/s LV PW:         1.30 cm   LV E/e' medial:  12.6 LV IVS:        1.20 cm   LV e' lateral:   7.94 cm/s LVOT diam:     2.20 cm   LV E/e' lateral: 7.2 LV SV:         67 LV SV Index:   32 LVOT Area:     3.80 cm                           3D Volume EF:                          3D EF:        57 %                          LV EDV:       73 ml                          LV ESV:  31 ml                          LV SV:        42 ml RIGHT VENTRICLE RV Basal diam:  3.20 cm RV S prime:     9.57 cm/s TAPSE (M-mode): 1.6 cm LEFT ATRIUM             Index        RIGHT ATRIUM           Index LA diam:        3.70 cm 1.78 cm/m   RA Area:     14.70 cm LA Vol (A2C):   18.5 ml 8.89 ml/m   RA Volume:   28.90 ml  13.89 ml/m LA Vol (A4C):   23.5 ml 11.29 ml/m LA Biplane Vol: 20.9 ml 10.04 ml/m  AORTIC VALVE AV Area (Vmax):    2.96 cm AV Area (Vmean):   2.92 cm AV Area (VTI):     3.26 cm AV Vmax:           118.00 cm/s AV Vmean:          81.400 cm/s AV VTI:            0.204 m AV Peak Grad:      5.6 mmHg AV Mean Grad:      3.0 mmHg LVOT Vmax:         92.00 cm/s LVOT Vmean:        62.600 cm/s LVOT VTI:          0.175 m LVOT/AV VTI ratio: 0.86  AORTA Ao Root diam: 3.50 cm Ao Asc diam:  3.20 cm MITRAL VALVE               TRICUSPID VALVE MV Area (PHT): 2.66 cm    TR Peak grad:   27.2 mmHg MV Decel Time: 285 msec    TR Vmax:        261.00 cm/s MV E velocity: 57.40 cm/s MV A velocity: 82.30 cm/s  SHUNTS MV E/A ratio:  0.70         Systemic VTI:  0.18 m                            Systemic Diam: 2.20 cm Shirlee More MD Electronically signed by Shirlee More MD Signature Date/Time: 03/14/2022/1:48:50 PM    Final          Latest Ref Rng & Units 04/21/2021    2:59 PM 04/11/2020   12:46 PM  PFT Results  FVC-Pre L 2.79  3.06   FVC-Predicted Pre % 62  68   FVC-Post L 2.68  3.10   FVC-Predicted Post % 60  69   Pre FEV1/FVC % % 83  82   Post FEV1/FCV % % 85  83   FEV1-Pre L 2.32  2.50   FEV1-Predicted Pre % 71  76   FEV1-Post L 2.29  2.56   DLCO uncorrected ml/min/mmHg 17.29  21.55   DLCO UNC% % 66  81   DLCO corrected ml/min/mmHg 17.29  21.55   DLCO COR %Predicted % 66  81   DLVA Predicted % 102  114   TLC L 4.36  4.75   TLC % Predicted % 59  64   RV % Predicted % 66  69     No results found for: "NITRICOXIDE"  Assessment & Plan:   IPF (idiopathic pulmonary fibrosis) (HCC) Interstitial lung disease and probable UIP pattern with progressive phenotype, consistent with IPF.  He was started on antifibrotic's with Ofev due to his declining pulmonary function testing and progression on imaging.  Had been tolerating Ofev well with occasional bouts of diarrhea that were controlled with as needed Imodium.  He recently developed hematochezia and thought this was due to the Ofev.  He has since stopped; however, symptoms have not improved.  He is awaiting appointment with GI and possible colonoscopy.  At this point, he does not want to go back on antifibrotic therapy.  Feels like his breathing is the least of his concerns.  We discussed disease progression and role of antifibrotic's.  Shared decision to move forward with clinical monitoring.  He will have PFTs in October.  Instructed him to notify if he changes his mind in the interim.  Patient Instructions  Continue Claritin 1 tab daily for allergies  Attend appointment with cardiology and GI as scheduled    Attend Pulmonary Function Testing in October   Follow up  with Dr. Vaughan Browner in October as scheduled. If symptoms do not improve or worsen, please contact office for sooner follow up or seek emergency care.     Pulmonary hypertension (HCC) Evidence of right-sided diastolic dysfunction and reduced TAPSE, concerning for underlying ILD PAH.  He was referred to cardiology by Dr. Vaughan Browner and has appointment in October to discuss right heart catheterization for further assessment.  He has prioritized his appointment with GI and colonoscopy first, understandably so, so felt that October would be best timing for him.  Hematochezia Rectal bleeding as well as recent weight loss. No clots or massive bleeding. He has appointment with GI next week and will likely be undergoing colonoscopy for further evaluation.  Strict ED precautions discussed.   I spent 28 minutes of dedicated to the care of this patient on the date of this encounter to include pre-visit review of records, face-to-face time with the patient discussing conditions above, post visit ordering of testing, clinical documentation with the electronic health record, making appropriate referrals as documented, and communicating necessary findings to members of the patients care team.  Clayton Bibles, NP 03/26/2022  Pt aware and understands NP's role.

## 2022-03-26 NOTE — Assessment & Plan Note (Signed)
Interstitial lung disease and probable UIP pattern with progressive phenotype, consistent with IPF.  He was started on antifibrotic's with Ofev due to his declining pulmonary function testing and progression on imaging.  Had been tolerating Ofev well with occasional bouts of diarrhea that were controlled with as needed Imodium.  He recently developed hematochezia and thought this was due to the Ofev.  He has since stopped; however, symptoms have not improved.  He is awaiting appointment with GI and possible colonoscopy.  At this point, he does not want to go back on antifibrotic therapy.  Feels like his breathing is the least of his concerns.  We discussed disease progression and role of antifibrotic's.  Shared decision to move forward with clinical monitoring.  He will have PFTs in October.  Instructed him to notify if he changes his mind in the interim.  Patient Instructions  Continue Claritin 1 tab daily for allergies  Attend appointment with cardiology and GI as scheduled    Attend Pulmonary Function Testing in October   Follow up with Dr. Vaughan Davis in October as scheduled. If symptoms do not improve or worsen, please contact office for sooner follow up or seek emergency care.

## 2022-03-26 NOTE — Assessment & Plan Note (Addendum)
Rectal bleeding as well as recent weight loss. No clots or massive bleeding. He has appointment with GI next week and will likely be undergoing colonoscopy for further evaluation.  Strict ED precautions discussed.

## 2022-03-26 NOTE — Patient Instructions (Addendum)
Continue Claritin 1 tab daily for allergies  Attend appointment with cardiology and GI as scheduled    Attend Pulmonary Function Testing in October   Follow up with Dr. Vaughan Browner in October as scheduled. If symptoms do not improve or worsen, please contact office for sooner follow up or seek emergency care.

## 2022-03-28 ENCOUNTER — Other Ambulatory Visit: Payer: Self-pay | Admitting: Gastroenterology

## 2022-03-28 DIAGNOSIS — K625 Hemorrhage of anus and rectum: Secondary | ICD-10-CM | POA: Diagnosis not present

## 2022-03-28 DIAGNOSIS — K649 Unspecified hemorrhoids: Secondary | ICD-10-CM | POA: Diagnosis not present

## 2022-03-28 DIAGNOSIS — Z800 Family history of malignant neoplasm of digestive organs: Secondary | ICD-10-CM | POA: Diagnosis not present

## 2022-03-30 ENCOUNTER — Telehealth: Payer: Self-pay | Admitting: Nurse Practitioner

## 2022-03-30 NOTE — Telephone Encounter (Signed)
Fax received from Dr. Ronnette Juniper with [facility] to perform a COLONOSCOPY on patient.  Patient needs surgery clearance. Surgery is 06/19/2022. Patient was seen on 03/26/2022. Office protocol is a risk assessment can be sent to surgeon if patient has been seen in 60 days or less.   Sending to Roxan Diesel NP for risk assessment or recommendations if patient needs to be seen in office prior to surgical procedure.

## 2022-03-30 NOTE — Telephone Encounter (Signed)
Agree with colonoscopy given his recent bloody stools. Note addendum for surgical clearance. Thanks.

## 2022-03-30 NOTE — Progress Notes (Addendum)
Addendum 03/30/2022 for surgical clearance for colonoscopy: Factors that increase the risk for postoperative pulmonary complications are IPF, age, PAH.  Respiratory complications generally occur in 1% of ASA Class I patients, 5% of ASA Class II and 10% of ASA Class III-IV patients These complications rarely result in mortality and include postoperative pneumonia, atelectasis, pulmonary embolism, ARDS and increased time requiring postoperative mechanical ventilation.   Overall, I recommend proceeding with the surgery if the risk for respiratory complications are outweighed by the potential benefits. This will need to be discussed between the patient and surgeon.   To reduce risks of respiratory complications, I recommend: --Pre- and post-operative incentive spirometry performed frequently while awake --Short duration of surgery as much as possible and avoid paralytic if possible --OOB, encourage mobility post-op

## 2022-04-02 NOTE — Telephone Encounter (Signed)
OV notes and clearance form have been faxed back to [facility]. Nothing further needed at this time.

## 2022-04-18 DIAGNOSIS — M25439 Effusion, unspecified wrist: Secondary | ICD-10-CM | POA: Diagnosis not present

## 2022-04-18 DIAGNOSIS — M25511 Pain in right shoulder: Secondary | ICD-10-CM | POA: Diagnosis not present

## 2022-04-18 DIAGNOSIS — M25539 Pain in unspecified wrist: Secondary | ICD-10-CM | POA: Diagnosis not present

## 2022-04-18 DIAGNOSIS — M059 Rheumatoid arthritis with rheumatoid factor, unspecified: Secondary | ICD-10-CM | POA: Diagnosis not present

## 2022-04-18 DIAGNOSIS — M1990 Unspecified osteoarthritis, unspecified site: Secondary | ICD-10-CM | POA: Diagnosis not present

## 2022-05-02 DIAGNOSIS — K625 Hemorrhage of anus and rectum: Secondary | ICD-10-CM | POA: Diagnosis not present

## 2022-05-02 DIAGNOSIS — R634 Abnormal weight loss: Secondary | ICD-10-CM | POA: Diagnosis not present

## 2022-05-17 ENCOUNTER — Ambulatory Visit: Payer: Medicare Other | Attending: Cardiology | Admitting: Cardiology

## 2022-05-17 VITALS — BP 132/88 | HR 73 | Ht 72.0 in | Wt 192.0 lb

## 2022-05-17 DIAGNOSIS — I745 Embolism and thrombosis of iliac artery: Secondary | ICD-10-CM | POA: Diagnosis not present

## 2022-05-17 DIAGNOSIS — E782 Mixed hyperlipidemia: Secondary | ICD-10-CM | POA: Diagnosis not present

## 2022-05-17 DIAGNOSIS — I272 Pulmonary hypertension, unspecified: Secondary | ICD-10-CM

## 2022-05-17 DIAGNOSIS — J849 Interstitial pulmonary disease, unspecified: Secondary | ICD-10-CM | POA: Diagnosis not present

## 2022-05-17 DIAGNOSIS — I739 Peripheral vascular disease, unspecified: Secondary | ICD-10-CM

## 2022-05-17 DIAGNOSIS — Z87891 Personal history of nicotine dependence: Secondary | ICD-10-CM

## 2022-05-17 DIAGNOSIS — I700 Atherosclerosis of aorta: Secondary | ICD-10-CM

## 2022-05-17 NOTE — Patient Instructions (Addendum)
Medication Instructions:  Your physician recommends that you continue on your current medications as directed. Please refer to the Current Medication list given to you today.  *If you need a refill on your cardiac medications before your next appointment, please call your pharmacy*   Lab Work: None Ordered If you have labs (blood work) drawn today and your tests are completely normal, you will receive your results only by: [name] (if you have MyChart) OR A paper copy in the mail If you have any lab test that is abnormal or we need to change your treatment, we will call you to review the results.   Testing/Procedures: Your physician has requested that you have a carotid duplex. This test is an ultrasound of the carotid arteries in your neck. It looks at blood flow through these arteries that supply the brain with blood. Allow one hour for this exam. There are no restrictions or special instructions.    Follow-Up: At [facility], you and your health needs are our priority.  As part of our continuing mission to provide you with exceptional heart care, we have created designated Provider Care Teams.  These Care Teams include your primary Cardiologist (physician) and Advanced Practice Providers (APPs -  Physician Assistants and Nurse Practitioners) who all work together to provide you with the care you need, when you need it.  We recommend signing up for the patient portal called "MyChart".  Sign up information is provided on this After Visit Summary.  MyChart is used to connect with patients for Virtual Visits (Telemedicine).  Patients are able to view lab/test results, encounter notes, upcoming appointments, etc.  Non-urgent messages can be sent to your provider as well.   To learn more about what you can do with MyChart, go to [email or web address].    Your next appointment:   2 month(s)  The format for your next appointment:   In Person  Provider:   Jenne Campus, MD     Other Instructions NA

## 2022-05-17 NOTE — Progress Notes (Signed)
Cardiology Consultation:    Date:  05/17/2022   ID:  RUDDY SWIRE, DOB [birth year], MRN [id number]  PCP:  Deland Pretty, MD  Cardiologist:  Jenne Campus, MD   Referring MD: Marshell Garfinkel, MD   Chief Complaint  Patient presents with   discuss cath    Dr. Therisa Doyne    History of Present Illness:    [name] is a 76 y.o. male who is being seen today for the evaluation of pulmonary hypertension at the request of Marshell Garfinkel, MD. past medical history significant for essential hypertension, dyslipidemia, remote smoking quit smoking 90s, peripheral vascular disease status post femorofemoral graft done couple years ago.  Recently he was diagnosed with idiopathic pulmonary fibrosis.  He CT of his chest showed enlargement of the pulmonary artery and suspicion for pulmonary hypertension, however, he did have echocardiogram done in 2021 as well as echocardiogram done recently showing normal pulmonary artery pressure, right ventricle size was normal right atrial size was normal so there is no indicators for pulmonary hypertension on echocardiogram.  Patient is doing fair.  He described to have shortness of breath that is gradually getting worse.  He does have some minimal swelling of lower extremities at evening time.  Denies have any chest pain tightness squeezing pressure burning chest but never reached the point of chest pain because of dyspnea on exertion while walking.  It looks like he never had coronary artery disease evaluation look like he never had myocardial infarction.  There was no evaluation of his cardiac arteries either. He is not on any special diet He does not exercise on a regular basis Past Medical History:  Diagnosis Date   Basal cell carcinoma 02/21/2011   left inner cheek, lateral (CX35FU)   Depression    GERD (gastroesophageal reflux disease)    Hyperlipidemia    Osteopenia    Squamous cell carcinoma of skin 07/21/2019   in situ- [facility] center scalp  (CX35FU)   Squamous cell carcinoma, face 12/13/2018   in situ- left forehead(CX35FU)    Past Surgical History:  Procedure Laterality Date   ABDOMINAL WALL MESH  REMOVAL  05/29/2008   FEMORAL-FEMORAL BYPASS GRAFT Left 01/13/2019   Procedure: BYPASS GRAFT FEMORAL-FEMORAL ARTERY;  Surgeon: Angelia Mould, MD;  Location: [facility];  Service: Vascular;  Laterality: Left;   INGUINAL HERNIA REPAIR N/A 12/28/2020   Procedure: LAPAROSCOPIC [place], LYSIS OF ADHESIONS;  Surgeon: Michael Boston, MD;  Location: [facility];  Service: General;  Laterality: N/A;   LUMBAR LAMINECTOMY     ORCHIECTOMY Left 01/12/2019   Procedure: LEFT INGUINAL RADICAL ORCHIECTOMY;  Surgeon: Lucas Mallow, MD;  Location: [facility];  Service: Urology;  Laterality: Left;   ROTATOR CUFF REPAIR Right    SMALL INTESTINE SURGERY  05/29/2008   SBO into mesh - SB resection, mesh removal & primary hernia repair   UMBILICAL HERNIA REPAIR  2009   open with Ventralex mesh patch    Current Medications: Current Meds  Medication Sig   cholecalciferol (VITAMIN D) 25 MCG (1000 UT) tablet Take 1,000 Units by mouth at bedtime.    folic acid (FOLVITE) 1 MG tablet Take 1 mg by mouth daily.   loratadine (CLARITIN) 10 MG tablet Take 1 tablet (10 mg total) by mouth daily.   omeprazole (PRILOSEC) 10 MG capsule Take 10 mg by mouth every morning.   polycarbophil (FIBERCON) 625 MG tablet Take 1 tablet (625 mg total) by mouth 2 (two)  times daily.   polyethylene glycol (MIRALAX / GLYCOLAX) 17 g packet Take 17 g by mouth daily.   rosuvastatin (CRESTOR) 10 MG tablet Take 10 mg by mouth daily.   sertraline (ZOLOFT) 50 MG tablet Take 50 mg by mouth daily as needed (anxiety).     Allergies:   Patient has no known allergies.   Social History   Socioeconomic History   Marital status: Widowed    Spouse name: Alven Alverio   Number of children: 2   Years of education: 12th grade   Highest education  level: Not on file  Occupational History   Occupation: grading  Tobacco Use   Smoking status: Former    Packs/day: 1.00    Years: 28.00    Total pack years: 28.00    Types: Cigarettes   Smokeless tobacco: Never   Tobacco comments:    quit 1992  Vaping Use   Vaping Use: Never used  Substance and Sexual Activity   Alcohol use: Yes    Alcohol/week: 6.0 standard drinks of alcohol    Types: 6 Cans of beer per week    Comment: beer occasional   Drug use: Never   Sexual activity: Not Currently  Other Topics Concern   Not on file  Social History Narrative   Not on file   Social Determinants of Health   Financial Resource Strain: Not on file  Food Insecurity: Not on file  Transportation Needs: Not on file  Physical Activity: Not on file  Stress: Not on file  Social Connections: Not on file     Family History: The patient's family history is negative for Other. ROS:   Please see the history of present illness.    All 14 point review of systems negative except as described per history of present illness.  EKGs/Labs/Other Studies Reviewed:    The following studies were reviewed today: Echocardiogram reviewed showing ejection fraction be normal normal pulm artery pressure  EKG:  EKG is  ordered today.  The ekg ordered today demonstrates normal sinus rhythm left intrafascicular block no ST segment changes  Recent Labs: 12/06/2021: BUN 16; Creatinine, Ser 0.92; Hemoglobin 12.7; Platelets 230; Potassium 3.8; Sodium 134 03/01/2022: ALT 27; NT-Pro BNP 518  Recent Lipid Panel No results found for: "CHOL", "TRIG", "HDL", "CHOLHDL", "VLDL", "LDLCALC", "LDLDIRECT"  Physical Exam:    VS:  BP 132/88 (BP Location: Left Arm, Patient Position: Sitting)   Pulse 73   Ht 6' (1.829 m)   Wt 192 lb (87.1 kg)   SpO2 95%   BMI 26.04 kg/m     Wt Readings from Last 3 Encounters:  05/17/22 192 lb (87.1 kg)  03/26/22 190 lb 3.2 oz (86.3 kg)  03/01/22 189 lb 3.2 oz (85.8 kg)     GEN:   Well nourished, well developed in no acute distress HEENT: Normal NECK: No JVD; No carotid bruits LYMPHATICS: No lymphadenopathy CARDIAC: RRR, no murmurs, no rubs, no gallops RESPIRATORY:  Clear to auscultation without rales, wheezing or rhonchi  ABDOMEN: Soft, non-tender, non-distended MUSCULOSKELETAL:  No edema; No deformity  SKIN: Warm and dry NEUROLOGIC:  Alert and oriented x 3 PSYCHIATRIC:  Normal affect   ASSESSMENT:    1. Pulmonary hypertension (HCC)   2. Hardening of the aorta (main artery of the heart) (HCC)   3. Iliac artery occlusion, left (HCC)   4. ILD (interstitial lung disease) (HCC)   5. Stopped smoking   6. Mixed hyperlipidemia    PLAN:    In order  of problems listed above:  Questionable pulmonary hypertension.  Pulmonary artery apparently enlarged on the CT however echocardiogram done in 2021 and just recently showing normal right ventricle size and left atrial size, and normal pulm artery pressure.  I will send a message to pulmonologist asking what level of suspicion for pulmonary hypertension he does have if that level is high will rule it out by doing cardiac catheterization.  Since he does have advanced peripheral vascular disease I also will look at his coronary arteries make sure he does not have any significant coronary disease Peripheral vascular disease that being followed by vascular surgeon.  He stopped his aspirin recently because there was some GI bleed but now he is back on it. Dyslipidemia he is taking Crestor 10 we will try to find his fasting lipid profile but I anticipate need to increase his statin. History of smoking likely does not smoke anymore. Because of advanced peripheral vascular disease I will do carotic ultrasound.   Medication Adjustments/Labs and Tests Ordered: Current medicines are reviewed at length with the patient today.  Concerns regarding medicines are outlined above.  No orders of the defined types were placed in this  encounter.  No orders of the defined types were placed in this encounter.   Signed, Park Liter, MD, [facility]. 05/17/2022 4:24 PM    [facility]

## 2022-05-18 ENCOUNTER — Telehealth: Payer: Self-pay

## 2022-05-18 DIAGNOSIS — I272 Pulmonary hypertension, unspecified: Secondary | ICD-10-CM

## 2022-05-18 DIAGNOSIS — R0609 Other forms of dyspnea: Secondary | ICD-10-CM

## 2022-05-18 NOTE — Telephone Encounter (Signed)
Called pt with Dr. Wendy Poet reply. Echo to be repeated in 1.5 months. Order entered.

## 2022-05-29 DIAGNOSIS — E785 Hyperlipidemia, unspecified: Secondary | ICD-10-CM | POA: Diagnosis not present

## 2022-05-29 DIAGNOSIS — M1990 Unspecified osteoarthritis, unspecified site: Secondary | ICD-10-CM | POA: Diagnosis not present

## 2022-06-04 ENCOUNTER — Ambulatory Visit: Payer: Medicare Other | Admitting: Pulmonary Disease

## 2022-06-12 ENCOUNTER — Encounter (HOSPITAL_COMMUNITY): Payer: Self-pay | Admitting: Gastroenterology

## 2022-06-13 ENCOUNTER — Ambulatory Visit (HOSPITAL_COMMUNITY)
Admission: RE | Admit: 2022-06-13 | Discharge: 2022-06-13 | Disposition: A | Discharge status: Home / Self Care | Payer: Medicare Other | Source: Ambulatory Visit | Attending: Cardiology | Admitting: Cardiology

## 2022-06-13 DIAGNOSIS — I739 Peripheral vascular disease, unspecified: Secondary | ICD-10-CM | POA: Insufficient documentation

## 2022-06-18 NOTE — H&P (Signed)
History of Present Illness General:         76 year old male history of pulmonary fibrosis, pulmonary hypertension, GERD, arthritis, incarcerated hernia status postrepair, presents for evaluation of rectal bleeding.        Last colonoscopy 10/2013 done in [facility]: Dr. Amedeo Plenty, internal hemorrhoids, repeat 5 years        Colonoscopy 03/2011 showed tubular adenoma        Echocardiogram 03/2022 shows ejection fraction 60 to 65%        patient was put on OFEB for lungs. side effect is rectal bleeding.        he was on medication and every time he would have a bowel movement it was diarrhea and bleeding.        states he stopped taking medication two weeks ago but still having some bleeding. feels like bleeding has improved some, but still present.        states if he strains or picks up something heavy he would rectal bleeding that would last a few days but then it would quit.        with being on medication it became daily.        reports very small amount of bright red bleeding in toliet and in stool.        denies weight loss        father with colon cancer age 79.        denies nausea/vomiting        denies change in bowel habits.  Current Medications Taking Sertraline HCl 100 MG Tablet 1 tablet Orally Once a day Not-Taking Aspir-81 81 MG Tablet Delayed Release 1 tablet Orally Once a day Medication List reviewed and reconciled with the patient Past Medical History      Hypercholesterolemia.      Chronic Anxiety.      Allergic Rhinitis.      Rotator Cuff Tendonitis.      ED.      Arthuritis.      Scar tissue in lungs. Surgical History       EGD- Dr Lajoyce Corners 2000       Colonoscopy- Dr Lajoyce Corners 2008       Seborrheic cyst removal       Low back surgery       Umbilical hernia repair       Small bowel resection (complication of umbilical hernia repair) 05/2008       Colonoscopy- 5 year repeat V12.72 03/2011,10/2013       rotator cuff 2013 Family History Father: deceased, Colon cancer  at age 26, MI at age 38, diagnosed with Colon cancer Mother: deceased 71 yrs, dementia Brother 45: deceased 52 yrs, MI Sister 1: alive 58 yrs, Healthy Sister 2: deceased 67 yrs, dementia 2 daughter(s) . Neg family history of colon polyps and liver diease. Social History General:  Tobacco use  cigarettes:  Former smoker, Tobacco history last updated  03/28/2022. no EXPOSURE TO PASSIVE SMOKE. Alcohol: yes, 1-2 per week, beer. no Recreational drug use. Allergies N.K.D.A. Hospitalization/Major Diagnostic Procedure Not in the past year 03/2022 Review of Systems GI PROCEDURE:         no Pacemaker/ AICD, no.  no Artificial heart valves.  no MI/heart attack.  no Abnormal heart rhythm.  no Angina.  no CVA.  no Hypertension.  no Hypotension.  no Asthma, COPD.  no Sleep apnea.  no Seizure disorders.  no Artificial joints.  no Severe DJD.  no Diabetes.  no Significant headaches.  no Vertigo.  Depression/anxiety  YES.  no Abnormal bleeding.  no Kidney Disease.  no Liver disease, no.  no Chance of pregnancy.  no Blood transfusion.  no Method of Birth Control.  no Birth control pills.   Vital Signs Wt 190.4, Wt change -37.6 lbs, Ht 72, BMI 25.82, Temp 97.5, Pulse sitting 98, BP sitting 111/74. Examination Gastroenterology Exam:        GENERAL APPEARANCE: Well developed, well nourished, no active distress, pleasant, no acute distress . EYES: Lids and conjunctiva normal. Sclera normal, pupils equal and reactive. RESPIRATORY Breath sounds normal. Respiration even and unlabored. CARDIOVASCULAR normal RRR w/o murmers or gallops. No peripheral edema. RECTAL: declined by patient, defer to colonoscopy. SKIN Warm and dry. PSYCHIATRIC Alert and oriented x3, mood and affect appear normal..  Assessments 1. Rectal bleeding - K62.5 (Primary) 2. Family history of colon cancer in father - Z80.0 3. Hemorrhoids, unspecified hemorrhoid type - K64.9 Treatment 1. Rectal bleeding       IMAGING: Colonoscopy              Clinical Notes: Rectal bleeding due to new medication. Still having rectal bleeding off of medication. History of internal hemorrhoids with last colonoscopy done for rectal bleeding. Most likely internal hemorrhoids, though with history of tubular adenomas and family history of colon cancer we will get colonoscopy for further evaluation. We will do trial of hydrocortisone suppositories if rectal bleeding is due to internal hemorrhoids.  Colonoscopy will done at hospital for extra precaution due to pulmonary hypertension. Adequate ejection fraction. We will get pulmonology clearance.                          2. Family history of colon cancer in father       IMAGING: Colonoscopy               Clinical Notes: due for repeat every 5 years. last colonoscopy 2015. age 40 when his father was diagnosed.                             3. Hemorrhoids, unspecified hemorrhoid type  Start Hydrocortisone Acetate Suppository, 25 MG, 1 suppository, Rectal, twice a day, 14 days, 28 suppositories, Refills 0 Notes: Please do sitz baths, increase fiber or add benefiber, increase water and increase activity. Will send in hydrocortisone suppository, cheapest with [employer] from [employer], [employer], [employer] or [employer] if your insurance does not pay for it.

## 2022-06-18 NOTE — Anesthesia Preprocedure Evaluation (Signed)
Anesthesia Evaluation  Patient identified by MRN, date of birth, ID band Patient awake    Reviewed: Allergy & Precautions, NPO status , Patient's Chart, lab work & pertinent test results  History of Anesthesia Complications Negative for: history of anesthetic complications  Airway Mallampati: III  TM Distance: >3 FB Neck ROM: Full    Dental  (+) Dental Advisory Given, Edentulous Upper   Pulmonary asthma , former smoker   Pulmonary exam normal        Cardiovascular + Peripheral Vascular Disease  Normal cardiovascular exam   '23 TTE - EF 60 to 65%. There is mild concentric left ventricular hypertrophy. Grade I diastolic dysfunction (impaired relaxation). Reduced TAPSE. Right ventricular systolic function is mildly reduced. Mild mitral valve regurgitation. Aortic valve regurgitation is  trivial. Aortic valve sclerosis/calcification is present, without any evidence of aortic stenosis.     Neuro/Psych  PSYCHIATRIC DISORDERS Anxiety Depression     Hearing loss     GI/Hepatic Neg liver ROS,GERD  Medicated and Controlled,,  Endo/Other  negative endocrine ROS    Renal/GU negative Renal ROS     Musculoskeletal  (+) Arthritis ,    Abdominal   Peds  Hematology negative hematology ROS (+)   Anesthesia Other Findings   Reproductive/Obstetrics                             Anesthesia Physical Anesthesia Plan  ASA: 2  Anesthesia Plan: MAC   Post-op Pain Management:    Induction:   PONV Risk Score and Plan: 1 and Propofol infusion and Treatment may vary due to age or medical condition  Airway Management Planned: Nasal Cannula and Natural Airway  Additional Equipment: None  Intra-op Plan:   Post-operative Plan:   Informed Consent: I have reviewed the patients History and Physical, chart, labs and discussed the procedure including the risks, benefits and alternatives for the proposed  anesthesia with the patient or authorized representative who has indicated his/her understanding and acceptance.       Plan Discussed with: CRNA and Anesthesiologist  Anesthesia Plan Comments:         Anesthesia Quick Evaluation

## 2022-06-19 ENCOUNTER — Ambulatory Visit (HOSPITAL_COMMUNITY)
Admission: RE | Admit: 2022-06-19 | Discharge: 2022-06-19 | Disposition: A | Discharge status: Home / Self Care | Payer: Medicare Other | Attending: Gastroenterology | Admitting: Gastroenterology

## 2022-06-19 ENCOUNTER — Other Ambulatory Visit: Payer: Self-pay

## 2022-06-19 ENCOUNTER — Ambulatory Visit (HOSPITAL_COMMUNITY): Payer: Medicare Other | Admitting: Anesthesiology

## 2022-06-19 ENCOUNTER — Encounter (HOSPITAL_COMMUNITY): Payer: Self-pay | Admitting: Gastroenterology

## 2022-06-19 ENCOUNTER — Encounter (HOSPITAL_COMMUNITY)
Admission: RE | Disposition: A | Discharge status: Home / Self Care | Payer: Self-pay | Source: Home / Self Care | Attending: Gastroenterology

## 2022-06-19 ENCOUNTER — Ambulatory Visit (HOSPITAL_BASED_OUTPATIENT_CLINIC_OR_DEPARTMENT_OTHER): Payer: Medicare Other | Admitting: Anesthesiology

## 2022-06-19 DIAGNOSIS — K5730 Diverticulosis of large intestine without perforation or abscess without bleeding: Secondary | ICD-10-CM | POA: Insufficient documentation

## 2022-06-19 DIAGNOSIS — K5731 Diverticulosis of large intestine without perforation or abscess with bleeding: Secondary | ICD-10-CM | POA: Diagnosis not present

## 2022-06-19 DIAGNOSIS — M1990 Unspecified osteoarthritis, unspecified site: Secondary | ICD-10-CM | POA: Diagnosis not present

## 2022-06-19 DIAGNOSIS — K648 Other hemorrhoids: Secondary | ICD-10-CM | POA: Diagnosis not present

## 2022-06-19 DIAGNOSIS — I700 Atherosclerosis of aorta: Secondary | ICD-10-CM | POA: Insufficient documentation

## 2022-06-19 DIAGNOSIS — K625 Hemorrhage of anus and rectum: Secondary | ICD-10-CM | POA: Insufficient documentation

## 2022-06-19 DIAGNOSIS — J45909 Unspecified asthma, uncomplicated: Secondary | ICD-10-CM | POA: Insufficient documentation

## 2022-06-19 DIAGNOSIS — K219 Gastro-esophageal reflux disease without esophagitis: Secondary | ICD-10-CM | POA: Insufficient documentation

## 2022-06-19 DIAGNOSIS — D122 Benign neoplasm of ascending colon: Secondary | ICD-10-CM

## 2022-06-19 DIAGNOSIS — Z87891 Personal history of nicotine dependence: Secondary | ICD-10-CM | POA: Insufficient documentation

## 2022-06-19 DIAGNOSIS — F32A Depression, unspecified: Secondary | ICD-10-CM | POA: Diagnosis not present

## 2022-06-19 DIAGNOSIS — F419 Anxiety disorder, unspecified: Secondary | ICD-10-CM | POA: Diagnosis not present

## 2022-06-19 DIAGNOSIS — Z800 Family history of malignant neoplasm of digestive organs: Secondary | ICD-10-CM | POA: Diagnosis not present

## 2022-06-19 DIAGNOSIS — F418 Other specified anxiety disorders: Secondary | ICD-10-CM | POA: Diagnosis not present

## 2022-06-19 HISTORY — PX: COLONOSCOPY WITH PROPOFOL: SHX5780

## 2022-06-19 HISTORY — PX: POLYPECTOMY: SHX5525

## 2022-06-19 SURGERY — COLONOSCOPY WITH PROPOFOL
Anesthesia: Monitor Anesthesia Care

## 2022-06-19 MED ORDER — PROPOFOL 10 MG/ML IV BOLUS
INTRAVENOUS | Status: DC | PRN
  Administered 2022-06-19 (×2): 10 mg via INTRAVENOUS

## 2022-06-19 MED ORDER — LIDOCAINE 2% (20 MG/ML) 5 ML SYRINGE
INTRAMUSCULAR | Status: DC | PRN
  Administered 2022-06-19: 40 mg via INTRAVENOUS

## 2022-06-19 MED ORDER — PROPOFOL 500 MG/50ML IV EMUL
INTRAVENOUS | Status: DC | PRN
  Administered 2022-06-19: 100 ug/kg/min via INTRAVENOUS

## 2022-06-19 MED ORDER — LACTATED RINGERS IV SOLN: INTRAVENOUS | Status: DC

## 2022-06-19 MED ORDER — PROPOFOL 1000 MG/100ML IV EMUL
INTRAVENOUS | Status: AC
  Filled 2022-06-19: qty 100

## 2022-06-19 MED ORDER — SODIUM CHLORIDE 0.9 % IV SOLN: INTRAVENOUS | Status: DC

## 2022-06-19 SURGICAL SUPPLY — 22 items

## 2022-06-19 NOTE — Op Note (Signed)
[facility] Patient Name: [name] Procedure Date: 06/19/2022 MRN: [id number] Attending MD: Ronnette Juniper , MD, [id number] Date of Birth: [birth year] CSN: [id number] Age: 76 Admit Type: Outpatient Procedure:                Colonoscopy Indications:              Family history of colon cancer in a first-degree                            relative (father at age 77), Last colonoscopy:                            2015, Rectal bleeding Providers:                Ronnette Juniper, MD, Dulcy Fanny, Gloris Ham, Technician Referring MD:             Tressia Miners Medicines:                Monitored Anesthesia Care Complications:            No immediate complications. Estimated blood loss:                            Minimal. Estimated Blood Loss:     Estimated blood loss was minimal. Procedure:                Pre-Anesthesia Assessment:                           - Prior to the procedure, a History and Physical                            was performed, and patient medications and                            allergies were reviewed. The patient's tolerance of                            previous anesthesia was also reviewed. The risks                            and benefits of the procedure and the sedation                            options and risks were discussed with the patient.                            All questions were answered, and informed consent                            was obtained. Prior Anticoagulants: The patient has                            taken no anticoagulant  or antiplatelet agents. ASA                            Grade Assessment: II - A patient with mild systemic                            disease. After reviewing the risks and benefits,                            the patient was deemed in satisfactory condition to                            undergo the procedure.                           After obtaining informed  consent, the colonoscope                            was passed under direct vision. Throughout the                            procedure, the patient's blood pressure, pulse, and                            oxygen saturations were monitored continuously. The                            [id number]-HQ190L ([id number] Olympus colonoscope was                            introduced through the anus and advanced to the the                            terminal ileum. The colonoscopy was performed                            without difficulty. The patient tolerated the                            procedure well. The quality of the bowel                            preparation was good. The ileocecal valve,                            appendiceal orifice, and rectum were photographed. Scope In: 8:29:34 AM Scope Out: 8:47:48 AM Scope Withdrawal Time: 0 hours 11 minutes 25 seconds  Total Procedure Duration: 0 hours 18 minutes 14 seconds  Findings:      Hemorrhoids were found on perianal exam.      The terminal ileum appeared normal.      A 6 mm polyp was found in the ascending colon. The polyp was sessile.       The polyp was removed with a hot snare. Resection and retrieval were  complete.      Multiple small-mouthed diverticula were found in the sigmoid colon and       descending colon.      Non-bleeding internal hemorrhoids were found during retroflexion.      The exam was otherwise without abnormality. Impression:               - Hemorrhoids found on perianal exam.                           - The examined portion of the ileum was normal.                           - One 6 mm polyp in the ascending colon, removed                            with a hot snare. Resected and retrieved.                           - Diverticulosis in the sigmoid colon and in the                            descending colon.                           - Non-bleeding internal hemorrhoids.                           - The examination  was otherwise normal. Moderate Sedation:      Patient did not receive moderate sedation for this procedure, but       instead received monitored anesthesia care. Recommendation:           - Patient has a contact number available for                            emergencies. The signs and symptoms of potential                            delayed complications were discussed with the                            patient. Return to normal activities tomorrow.                            Written discharge instructions were provided to the                            patient.                           - High fiber diet.                           - Continue present medications.                           - Await pathology results.                           -  Repeat colonoscopy for surveillance based on                            pathology results. Procedure Code(s):        --- Professional ---                           [phone], Colonoscopy, flexible; with removal of                            tumor(s), polyp(s), or other lesion(s) by snare                            technique Diagnosis Code(s):        --- Professional ---                           K64.8, Other hemorrhoids                           D12.2, Benign neoplasm of ascending colon                           Z80.0, Family history of malignant neoplasm of                            digestive organs                           K62.5, Hemorrhage of anus and rectum                           K57.30, Diverticulosis of large intestine without                            perforation or abscess without bleeding CPT copyright 2022 American Medical Association. All rights reserved. The codes documented in this report are preliminary and upon coder review may  be revised to meet current compliance requirements. Ronnette Juniper, MD 06/19/2022 8:53:15 AM This report has been signed electronically. Number of Addenda: 0

## 2022-06-19 NOTE — Discharge Instructions (Signed)

## 2022-06-19 NOTE — Anesthesia Postprocedure Evaluation (Signed)
Anesthesia Post Note  Patient: [name]  Procedure(s) Performed: COLONOSCOPY WITH PROPOFOL POLYPECTOMY     Patient location during evaluation: PACU Anesthesia Type: MAC Level of consciousness: awake and alert Pain management: pain level controlled Vital Signs Assessment: post-procedure vital signs reviewed and stable Respiratory status: spontaneous breathing, nonlabored ventilation and respiratory function stable Cardiovascular status: stable and blood pressure returned to baseline Anesthetic complications: no   No notable events documented.  Last Vitals:  Vitals:   06/19/22 0852 06/19/22 0900  BP: 107/68 126/80  Pulse: 75 79  Resp: 13 14  Temp: 36.4 C   SpO2: 100% 98%    Last Pain:  Vitals:   06/19/22 0910  TempSrc:   PainSc: 0-No pain                 [name]

## 2022-06-19 NOTE — Transfer of Care (Signed)
Immediate Anesthesia Transfer of Care Note  Patient: [name]  Procedure(s) Performed: COLONOSCOPY WITH PROPOFOL POLYPECTOMY  Patient Location: PACU and Endoscopy Unit  Anesthesia Type:MAC  Level of Consciousness: drowsy and patient cooperative  Airway & Oxygen Therapy: Patient Spontanous Breathing and Patient connected to face mask oxygen  Post-op Assessment: Report given to RN and Post -op Vital signs reviewed and stable  Post vital signs: Reviewed and stable  Last Vitals:  Vitals Value Taken Time  BP    Temp    Pulse 73 06/19/22 0854  Resp 20 06/19/22 0854  SpO2 100 % 06/19/22 0854  Vitals shown include unvalidated device data.  Last Pain:  Vitals:   06/19/22 0756  TempSrc: Oral  PainSc: 0-No pain      Patients Stated Pain Goal: 2 (08/03/40 1464)  Complications: No notable events documented.

## 2022-06-19 NOTE — Anesthesia Procedure Notes (Signed)
Procedure Name: MAC Date/Time: 06/19/2022 8:21 AM  Performed by: Maxwell Caul, CRNAPre-anesthesia Checklist: Patient identified, Emergency Drugs available, Suction available and Patient being monitored Oxygen Delivery Method: Simple face mask

## 2022-06-20 ENCOUNTER — Encounter (HOSPITAL_COMMUNITY): Payer: Self-pay | Admitting: Gastroenterology

## 2022-06-21 LAB — SURGICAL PATHOLOGY

## 2022-06-25 DIAGNOSIS — E8809 Other disorders of plasma-protein metabolism, not elsewhere classified: Secondary | ICD-10-CM | POA: Diagnosis not present

## 2022-06-25 DIAGNOSIS — M1990 Unspecified osteoarthritis, unspecified site: Secondary | ICD-10-CM | POA: Diagnosis not present

## 2022-06-25 DIAGNOSIS — Z79899 Other long term (current) drug therapy: Secondary | ICD-10-CM | POA: Diagnosis not present

## 2022-06-28 ENCOUNTER — Telehealth: Payer: Self-pay

## 2022-06-28 NOTE — Telephone Encounter (Signed)
Results reviewed with pt as per Dr. Krasowski's note.  Pt verbalized understanding and had no additional questions. Routed to PCP  

## 2022-07-18 ENCOUNTER — Encounter: Payer: Self-pay | Admitting: Cardiology

## 2022-07-18 ENCOUNTER — Ambulatory Visit: Payer: Medicare Other | Attending: Cardiology | Admitting: Cardiology

## 2022-07-18 VITALS — BP 110/70 | HR 78 | Ht 72.0 in | Wt 188.0 lb

## 2022-07-18 DIAGNOSIS — I739 Peripheral vascular disease, unspecified: Secondary | ICD-10-CM | POA: Diagnosis not present

## 2022-07-18 DIAGNOSIS — J849 Interstitial pulmonary disease, unspecified: Secondary | ICD-10-CM | POA: Insufficient documentation

## 2022-07-18 DIAGNOSIS — I272 Pulmonary hypertension, unspecified: Secondary | ICD-10-CM | POA: Insufficient documentation

## 2022-07-18 DIAGNOSIS — R0609 Other forms of dyspnea: Secondary | ICD-10-CM | POA: Diagnosis not present

## 2022-07-18 NOTE — Patient Instructions (Signed)
Medication Instructions:  Your physician recommends that you continue on your current medications as directed. Please refer to the Current Medication list given to you today.  *If you need a refill on your cardiac medications before your next appointment, please call your pharmacy*   Lab Work: None Ordered If you have labs (blood work) drawn today and your tests are completely normal, you will receive your results only by: [place] (if you have MyChart) OR A paper copy in the mail If you have any lab test that is abnormal or we need to change your treatment, we will call you to review the results.   Testing/Procedures: Your physician has requested that you have an echocardiogram. Echocardiography is a painless test that uses sound waves to create images of your heart. It provides your doctor with information about the size and shape of your heart and how well your heart's chambers and valves are working. This procedure takes approximately one hour. There are no restrictions for this procedure. Please do NOT wear cologne, perfume, aftershave, or lotions (deodorant is allowed). Please arrive 15 minutes prior to your appointment time.    Follow-Up: At [facility], you and your health needs are our priority.  As part of our continuing mission to provide you with exceptional heart care, we have created designated Provider Care Teams.  These Care Teams include your primary Cardiologist (physician) and Advanced Practice Providers (APPs -  Physician Assistants and Nurse Practitioners) who all work together to provide you with the care you need, when you need it.  We recommend signing up for the patient portal called "MyChart".  Sign up information is provided on this After Visit Summary.  MyChart is used to connect with patients for Virtual Visits (Telemedicine).  Patients are able to view lab/test results, encounter notes, upcoming appointments, etc.  Non-urgent messages can be sent to your  provider as well.   To learn more about what you can do with MyChart, go to [email or web address].    Your next appointment:   3 month(s)- After Echo  The format for your next appointment:   In Person  Provider:   Jenne Campus, MD    Other Instructions NA

## 2022-07-18 NOTE — Progress Notes (Signed)
Cardiology Office Note:    Date:  07/18/2022   ID:  [name], DOB 09/30/1945, MRN [id number]  PCP:  Deland Pretty, MD  Cardiologist:  Jenne Campus, MD    Referring MD: Deland Pretty, MD   Chief Complaint  Patient presents with   Follow-up  Doing well  History of Present Illness:    [name] is a 76 y.o. male with past medical history significant for essential hypertension, dyslipidemia, remote history of smoking quit smoking in the 90s, peripheral vascular disease status post femoral per femoral bypass graft done many years ago.  He is recently diagnosed with idiopathic pulmonary fibrosis, CT showed enlargement of the pulmonary artery with suspicion of pulmonary hypertension he was referred to [place] for possible cardiac catheterization to rule it out.  I did review echocardiogram done in 2-21 which showed no evidence of pulmonary hypertension, right ventricle was normal size and function, I did review his echocardiogram done recently also did not show pulmonary hypertension with normal right ventricle size and function.  Overall he comes today for follow-up doing well.  Shortness of breath as usual not worse no swelling of lower extremities no paroxysmal nocturnal dyspnea  Past Medical History:  Diagnosis Date   Basal cell carcinoma 02/21/2011   left inner cheek, lateral (CX35FU)   Depression    GERD (gastroesophageal reflux disease)    Hyperlipidemia    Osteopenia    Squamous cell carcinoma of skin 07/21/2019   in situ- fornt center scalp (CX35FU)   Squamous cell carcinoma, face 12/13/2018   in situ- left forehead(CX35FU)    Past Surgical History:  Procedure Laterality Date   ABDOMINAL WALL MESH  REMOVAL  05/29/2008   COLONOSCOPY WITH PROPOFOL N/A 06/19/2022   Procedure: COLONOSCOPY WITH PROPOFOL;  Surgeon: Ronnette Juniper, MD;  Location: [facility];  Service: Gastroenterology;  Laterality: N/A;   FEMORAL-FEMORAL BYPASS GRAFT Left 01/13/2019   Procedure: BYPASS  GRAFT FEMORAL-FEMORAL ARTERY;  Surgeon: Angelia Mould, MD;  Location: [facility];  Service: Vascular;  Laterality: Left;   INGUINAL HERNIA REPAIR N/A 12/28/2020   Procedure: LAPAROSCOPIC VENTERAL HERNIA REPAIR WITH MESH, LYSIS OF ADHESIONS;  Surgeon: Michael Boston, MD;  Location: [facility];  Service: General;  Laterality: N/A;   LUMBAR LAMINECTOMY     ORCHIECTOMY Left 01/12/2019   Procedure: LEFT INGUINAL RADICAL ORCHIECTOMY;  Surgeon: Lucas Mallow, MD;  Location: [facility];  Service: Urology;  Laterality: Left;   POLYPECTOMY  06/19/2022   Procedure: POLYPECTOMY;  Surgeon: Ronnette Juniper, MD;  Location: [facility];  Service: Gastroenterology;;   ROTATOR CUFF REPAIR Right    SMALL INTESTINE SURGERY  05/29/2008   SBO into mesh - SB resection, mesh removal & primary hernia repair   UMBILICAL HERNIA REPAIR  2009   open with Ventralex mesh patch    Current Medications: Current Meds  Medication Sig   aspirin EC 81 MG tablet Take 81 mg by mouth daily. Swallow whole.   Calcium-Phosphorus-Vitamin D (CITRACAL +D3 PO) Take 1 tablet by mouth daily.   cholecalciferol (VITAMIN D) 25 MCG (1000 UT) tablet Take 1,000 Units by mouth at bedtime.    diclofenac Sodium (VOLTAREN ARTHRITIS PAIN) 1 % GEL Apply 2 g topically 2 (two) times daily as needed (arthritis pain).   folic acid (FOLVITE) 1 MG tablet Take 1 mg by mouth daily.   ibuprofen (ADVIL) 200 MG tablet Take 400 mg by mouth every 6 (six) hours as needed for mild pain.   loratadine (  CLARITIN) 10 MG tablet Take 1 tablet (10 mg total) by mouth daily.   magnesium hydroxide (MILK OF MAGNESIA) 400 MG/5ML suspension Take 15 mLs by mouth daily as needed for mild constipation.   omeprazole (PRILOSEC) 10 MG capsule Take 10 mg by mouth every morning.   polyethylene glycol (MIRALAX / GLYCOLAX) 17 g packet Take 17 g by mouth daily. (Patient taking differently: Take 17 g by mouth daily as needed for mild constipation.)   rosuvastatin (CRESTOR) 10  MG tablet Take 10 mg by mouth daily.   sertraline (ZOLOFT) 50 MG tablet Take 50 mg by mouth daily.     Allergies:   Patient has no known allergies.   Social History   Socioeconomic History   Marital status: Widowed    Spouse name: [name]   Number of children: 2   Years of education: 12th grade   Highest education level: Not on file  Occupational History   Occupation: grading  Tobacco Use   Smoking status: Former    Packs/day: 1.00    Years: 28.00    Total pack years: 28.00    Types: Cigarettes   Smokeless tobacco: Never   Tobacco comments:    quit 1992  Vaping Use   Vaping Use: Never used  Substance and Sexual Activity   Alcohol use: Yes    Alcohol/week: 6.0 standard drinks of alcohol    Types: 6 Cans of beer per week    Comment: beer occasional   Drug use: Never   Sexual activity: Not Currently  Other Topics Concern   Not on file  Social History Narrative   Not on file   Social Determinants of Health   Financial Resource Strain: Not on file  Food Insecurity: Not on file  Transportation Needs: Not on file  Physical Activity: Not on file  Stress: Not on file  Social Connections: Not on file     Family History: The patient's family history is negative for Other. ROS:   Please see the history of present illness.    All 14 point review of systems negative except as described per history of present illness  EKGs/Labs/Other Studies Reviewed:      Recent Labs: 12/06/2021: BUN 16; Creatinine, Ser 0.92; Hemoglobin 12.7; Platelets 230; Potassium 3.8; Sodium 134 03/01/2022: ALT 27; NT-Pro BNP 518  Recent Lipid Panel No results found for: "CHOL", "TRIG", "HDL", "CHOLHDL", "VLDL", "LDLCALC", "LDLDIRECT"  Physical Exam:    VS:  BP 110/70 (BP Location: Left Arm, Patient Position: Sitting)   Pulse 78   Ht 6' (1.829 m)   Wt 188 lb (85.3 kg)   SpO2 96%   BMI 25.50 kg/m     Wt Readings from Last 3 Encounters:  07/18/22 188 lb (85.3 kg)  06/19/22 185  lb (83.9 kg)  05/17/22 192 lb (87.1 kg)     GEN:  Well nourished, well developed in no acute distress HEENT: Normal NECK: No JVD; No carotid bruits LYMPHATICS: No lymphadenopathy CARDIAC: RRR, no murmurs, no rubs, no gallops RESPIRATORY:  Clear to auscultation without rales, wheezing or rhonchi  ABDOMEN: Soft, non-tender, non-distended MUSCULOSKELETAL:  No edema; No deformity  SKIN: Warm and dry LOWER EXTREMITIES: no swelling NEUROLOGIC:  Alert and oriented x 3 PSYCHIATRIC:  Normal affect   ASSESSMENT:    1. Pulmonary hypertension (HCC)   2. Peripheral vascular disease ([place])   3. ILD (interstitial lung disease) ([place])    PLAN:    In order of problems listed above:  Questionable pulmonary  hypertension.  I do not have indicators based on echocardiogram of pulmonary hypertension.  The question to our pulmonary colleagues I have is level of suspicion to have from pulmonary hypertension.  If level suspicion is high did we do cardiac catheterization if not I will simply schedule him to have another echocardiogram in about 3 months to reassess the value of pulmonary hypertension. Peripheral vascular disease I did cardiac ultrasounds which did not show any critical stenosis.  Will continue monitoring. Interstitial lung disease: Follow-up excellently as always by our pulmonary team. Dyslipidemia he is taking Crestor 10 which I will continue he is also on antiplatelet therapy, I did review K PN which show me his LDL of 72 total cholesterol 154 HDL 57 we will continue present management.  Echocardiogram will be done in 3 months and I see him shortly after that or if pulmonary team suspect with high level suspicion of pulmonary hypertension will do cardiac catheterization   Medication Adjustments/Labs and Tests Ordered: Current medicines are reviewed at length with the patient today.  Concerns regarding medicines are outlined above.  No orders of the defined types were placed in this  encounter.  Medication changes: No orders of the defined types were placed in this encounter.   Signed, Park Liter, MD, [facility] 07/18/2022 2:37 PM    [place]

## 2022-07-20 ENCOUNTER — Encounter: Payer: Self-pay | Admitting: Pulmonary Disease

## 2022-07-20 ENCOUNTER — Ambulatory Visit (INDEPENDENT_AMBULATORY_CARE_PROVIDER_SITE_OTHER): Payer: Medicare Other | Admitting: Pulmonary Disease

## 2022-07-20 VITALS — BP 114/64 | HR 95 | Temp 98.1°F | Ht 71.0 in | Wt 186.0 lb

## 2022-07-20 DIAGNOSIS — Z5181 Encounter for therapeutic drug level monitoring: Secondary | ICD-10-CM | POA: Diagnosis not present

## 2022-07-20 DIAGNOSIS — I745 Embolism and thrombosis of iliac artery: Secondary | ICD-10-CM

## 2022-07-20 DIAGNOSIS — J849 Interstitial pulmonary disease, unspecified: Secondary | ICD-10-CM

## 2022-07-20 DIAGNOSIS — R0602 Shortness of breath: Secondary | ICD-10-CM

## 2022-07-20 DIAGNOSIS — J84112 Idiopathic pulmonary fibrosis: Secondary | ICD-10-CM | POA: Diagnosis not present

## 2022-07-20 LAB — PULMONARY FUNCTION TEST
DL/VA % pred: 110 %
DL/VA: 4.29 ml/min/mmHg/L
DLCO cor % pred: 57 %
DLCO cor: 14.84 ml/min/mmHg
DLCO unc % pred: 57 %
DLCO unc: 14.84 ml/min/mmHg
FEF 25-75 Post: 2.37 L/sec
FEF 25-75 Pre: 2.12 L/sec
FEF2575-%Change-Post: 11 %
FEF2575-%Pred-Post: 107 %
FEF2575-%Pred-Pre: 95 %
FEV1-%Change-Post: 6 %
FEV1-%Pred-Post: 62 %
FEV1-%Pred-Pre: 59 %
FEV1-Post: 1.98 L
FEV1-Pre: 1.86 L
FEV1FVC-%Change-Post: 10 %
FEV1FVC-%Pred-Pre: 106 %
FEV6-%Change-Post: -4 %
FEV6-%Pred-Post: 56 %
FEV6-%Pred-Pre: 59 %
FEV6-Post: 2.33 L
FEV6-Pre: 2.43 L
FEV6FVC-%Pred-Post: 106 %
FEV6FVC-%Pred-Pre: 106 %
FVC-%Change-Post: -3 %
FVC-%Pred-Post: 53 %
FVC-%Pred-Pre: 55 %
FVC-Post: 2.35 L
FVC-Pre: 2.43 L
Post FEV1/FVC ratio: 84 %
Post FEV6/FVC ratio: 100 %
Pre FEV1/FVC ratio: 77 %
Pre FEV6/FVC Ratio: 100 %
RV % pred: 52 %
RV: 1.43 L
TLC % pred: 51 %
TLC: 3.76 L

## 2022-07-20 NOTE — Progress Notes (Signed)
[name]    [id number]    [birth year]  Primary Care Physician:Pharr, Thayer Jew, MD  Referring Physician: Deland Pretty, MD [address] [place],  [place] [id number]  Chief complaint:  Follow-up for interstitial lung disease, IPF Ofev started November 6617  HPI: 76 year old former smoker referred to pulmonary for evaluation of interstitial lung disease States that he has mild dyspnea on exertion.  No cough. 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 Exposures: Exposed to dust.  No asbestos exposure.  No mold, hot tub, Jacuzzi.  No down pillows or comforters Smoking history: 20-pack-year smoker.  Quit in the 1990s Travel history: No significant travel history Relevant family history: No significant family history of lung disease  Interim history: Continues on Ofev.   He started having diarrhea with Ofev and stopped taking it as he noted blood in the stools.  Since been evaluated by GI with findings of hemorrhoids.  He is here for review of PFTs.  ---------------------- Received clinic note from Dr. Dossie Der MD, rheumatology dated 02/12/2022 He has been referred by Dr. Shelia Media for evaluation of inflammatory arthritis, shoulder pain and wrist pain x-rays showed osteoarthritis with inflammation of bilateral wrists.  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  He has been given local steroid injection and possibly needs oral steroids CCP, CRP, sed rate and rheumatoid factor and uric acid ordered.  Outpatient Encounter Medications as of 07/20/2022  Medication Sig   aspirin EC 81 MG tablet Take 81 mg by mouth daily. Swallow whole.   Calcium-Phosphorus-Vitamin D (CITRACAL +D3 PO) Take 1 tablet by mouth daily.   cholecalciferol (VITAMIN D) 25 MCG (1000 UT) tablet Take 1,000 Units by mouth at bedtime.    diclofenac Sodium (VOLTAREN ARTHRITIS PAIN) 1 %  GEL Apply 2 g topically 2 (two) times daily as needed (arthritis pain).   folic acid (FOLVITE) 1 MG tablet Take 1 mg by mouth daily.   ibuprofen (ADVIL) 200 MG tablet Take 400 mg by mouth every 6 (six) hours as needed for mild pain.   loratadine (CLARITIN) 10 MG tablet Take 1 tablet (10 mg total) by mouth daily.   magnesium hydroxide (MILK OF MAGNESIA) 400 MG/5ML suspension Take 15 mLs by mouth daily as needed for mild constipation.   omeprazole (PRILOSEC) 10 MG capsule Take 10 mg by mouth every morning.   polyethylene glycol (MIRALAX / GLYCOLAX) 17 g packet Take 17 g by mouth daily. (Patient taking differently: Take 17 g by mouth daily as needed for mild constipation.)   rosuvastatin (CRESTOR) 10 MG tablet Take 10 mg by mouth daily.   sertraline (ZOLOFT) 50 MG tablet Take 50 mg by mouth daily.   No facility-administered encounter medications on file as of 07/20/2022.    Physical Exam: Blood pressure 114/64, pulse 95, temperature 98.1 F (36.7 C), temperature source Oral, height '5\' 11"'$  (1.803 m), weight 186 lb (84.4 kg), SpO2 95 %. Gen:      No acute distress HEENT:  EOMI, sclera anicteric Neck:     No masses; no thyromegaly Lungs:    Clear to auscultation bilaterally; normal respiratory effort CV:         Regular rate and rhythm; no murmurs Abd:      + bowel sounds; soft, non-tender; no palpable masses, no distension Ext:    No edema; adequate peripheral perfusion Skin:  Warm and dry; no rash Neuro: alert and oriented x 3 Psych: normal mood and affect   Data Reviewed: Imaging: High-res CT 02/05/2020-groundglass attenuation with reticulation and traction bronchiectasis with basal gradient.  2 to 4 mm pulmonary nodules.  Probable UIP pattern.  High-resolution CT 02/06/2021-stable pattern of pulmonary fibrosis and probable UIP pattern.  Stable pulm nodules.  High-res CT 02/20/2022-slight interval progression with honeycombing.  Probable UIP. I have reviewed the images  personally.  PFTs: 04/11/2020 FVC 3.10 [69%], FEV1 2.56 [78%], F/F 83, TLC 4.75 [64%], DLCO 21.55 [81%] Moderate restriction  04/21/2021 FVC 2.68 [60%], FEV1 2.29 [71%], F/F 85, TLC 4.36 [59%], DLCO 17.29 [66%] Moderate restriction, severe diffusion defect.  Worsening lung volumes and diffusion defect  07/20/2022 FVC 2.35 [53%], FEV1 1.98 [62%], F/F84, TLC 3.76 [51%], DLCO 14.84 [57%] Severe restriction, moderate diffusion defect  Labs: IgE 02/02/2020-1344 Hypersensitivity panel 02/03/2020-negative ANA, CCP, rheumatoid factor 02/03/2020-negative  CTD serologies 04/11/2020-negative  Cardiology: Echocardiogram 03/13/2022-LVEF 60 to 50%, grade 1 diastolic dysfunction, reduced TAPSE  Assessment:  Pulmonary fibrosis CT reviewed with probable UIP pattern No known exposures or CTD symptoms CTD serologies and exposure history is negative  Given progression on PFTs he is a candidate for treatment.  Discussed options in detail including antifibrotic's and we have decided to proceed with Ofev which was started in November 2022.  He recently stopped taking it as he has having diarrhea with blood in the stools which is secondary to hemorrhoids.  We discussed treatment options and have decided to reinitiate at lower dose of 100 mg twice daily Check hepatic panel and proBNP for dyspnea  Concern for pulmonary hypertension Echo reviewed with reduced TAPSE.  I discussed with Dr. Agustin Cree, cardiology.  He does not feel that there is pulmonary hypertension.  We will get a follow-up echo and reassess.  Plan/Recommendations: Resume Ofev at 100 mg twice daily Imodium as needed Monitor hepatic panel, proBNP  Marshell Garfinkel MD [facility] 07/20/2022, 4:10 PM  CC: Deland Pretty, MD

## 2022-07-20 NOTE — Progress Notes (Signed)
Full PFT performed today. °

## 2022-07-20 NOTE — Patient Instructions (Signed)
Will get some labs today for monitoring including comprehensive metabolic panel and proBNP for dyspnea Will resume the Ofev at 100 mg twice daily Follow-up in 3 months.

## 2022-07-20 NOTE — Patient Instructions (Signed)
Full PFT performed today. °

## 2022-07-21 LAB — COMPREHENSIVE METABOLIC PANEL
ALT: 13 IU/L (ref 0–44)
AST: 23 IU/L (ref 0–40)
Albumin/Globulin Ratio: 0.8 — ABNORMAL LOW (ref 1.2–2.2)
Albumin: 3.3 g/dL — ABNORMAL LOW (ref 3.8–4.8)
Alkaline Phosphatase: 139 IU/L — ABNORMAL HIGH (ref 44–121)
BUN/Creatinine Ratio: 14 (ref 10–24)
BUN: 12 mg/dL (ref 8–27)
Bilirubin Total: 0.3 mg/dL (ref 0.0–1.2)
CO2: 22 mmol/L (ref 20–29)
Calcium: 9.6 mg/dL (ref 8.6–10.2)
Chloride: 97 mmol/L (ref 96–106)
Creatinine, Ser: 0.83 mg/dL (ref 0.76–1.27)
Globulin, Total: 4 g/dL (ref 1.5–4.5)
Glucose: 104 mg/dL — ABNORMAL HIGH (ref 70–99)
Potassium: 4.4 mmol/L (ref 3.5–5.2)
Sodium: 136 mmol/L (ref 134–144)
Total Protein: 7.3 g/dL (ref 6.0–8.5)
eGFR: 90 mL/min/{1.73_m2} (ref >59)

## 2022-07-21 LAB — PRO B NATRIURETIC PEPTIDE: NT-Pro BNP: 546 pg/mL — ABNORMAL HIGH (ref 0–486)

## 2022-07-23 ENCOUNTER — Telehealth: Payer: Self-pay | Admitting: Pharmacist

## 2022-07-23 DIAGNOSIS — J849 Interstitial pulmonary disease, unspecified: Secondary | ICD-10-CM

## 2022-07-23 MED ORDER — OFEV 100 MG PO CAPS: 100.0000 mg | ORAL_CAPSULE | Freq: Two times a day (BID) | ORAL | 0 refills | Status: DC

## 2022-07-23 NOTE — Telephone Encounter (Addendum)
Patient receiving Ofev from [facility].  Rx for Ofev '100mg'$  twice daily sent to [facility].  Called patient to advise of this. He states he has not started 2024 PAP renewal process. Encouraged him to ask [facility] Cares about renewal application. He verbalized understanding (information sent to patient via text per his request)  Knox Saliva, PharmD, MPH, BCPS, CPP Clinical Pharmacist (Rheumatology and Pulmonology)  ----- Message from Marshell Garfinkel, MD sent at 07/20/2022  4:22 PM EST ----- Can you please send in prescription for Ofev 100 mg twice daily. We are trying to reduce dose as he has diarrhea.

## 2022-07-23 NOTE — Progress Notes (Signed)
Rx sent to [facility] for Ofev '100mg'$  twice daily  Knox Saliva, PharmD, MPH, BCPS, CPP Clinical Pharmacist (Rheumatology and Pulmonology)

## 2022-07-27 ENCOUNTER — Other Ambulatory Visit: Payer: Medicare Other

## 2022-07-27 ENCOUNTER — Ambulatory Visit (HOSPITAL_BASED_OUTPATIENT_CLINIC_OR_DEPARTMENT_OTHER)
Admission: RE | Admit: 2022-07-27 | Discharge: 2022-07-27 | Disposition: A | Discharge status: Home / Self Care | Payer: Medicare Other | Source: Ambulatory Visit | Attending: Cardiology | Admitting: Cardiology

## 2022-07-27 DIAGNOSIS — I272 Pulmonary hypertension, unspecified: Secondary | ICD-10-CM | POA: Diagnosis not present

## 2022-07-27 DIAGNOSIS — R0609 Other forms of dyspnea: Secondary | ICD-10-CM | POA: Diagnosis not present

## 2022-07-27 LAB — ECHOCARDIOGRAM COMPLETE
Area-P 1/2: 3.68 cm2
MV M vel: 5.3 m/s
MV Peak grad: 112.4 mmHg
P 1/2 time: 800 msec
Radius: 0.3 cm
S' Lateral: 2.6 cm

## 2022-08-09 DIAGNOSIS — Z23 Encounter for immunization: Secondary | ICD-10-CM | POA: Diagnosis not present

## 2022-08-16 DIAGNOSIS — M25539 Pain in unspecified wrist: Secondary | ICD-10-CM | POA: Diagnosis not present

## 2022-08-16 DIAGNOSIS — M1990 Unspecified osteoarthritis, unspecified site: Secondary | ICD-10-CM | POA: Diagnosis not present

## 2022-08-16 DIAGNOSIS — M25561 Pain in right knee: Secondary | ICD-10-CM | POA: Diagnosis not present

## 2022-08-16 DIAGNOSIS — M25562 Pain in left knee: Secondary | ICD-10-CM | POA: Diagnosis not present

## 2022-08-16 DIAGNOSIS — M79643 Pain in unspecified hand: Secondary | ICD-10-CM | POA: Diagnosis not present

## 2022-08-16 DIAGNOSIS — M059 Rheumatoid arthritis with rheumatoid factor, unspecified: Secondary | ICD-10-CM | POA: Diagnosis not present

## 2022-08-16 DIAGNOSIS — M25511 Pain in right shoulder: Secondary | ICD-10-CM | POA: Diagnosis not present

## 2022-08-16 DIAGNOSIS — M25512 Pain in left shoulder: Secondary | ICD-10-CM | POA: Diagnosis not present

## 2022-08-29 ENCOUNTER — Emergency Department (HOSPITAL_BASED_OUTPATIENT_CLINIC_OR_DEPARTMENT_OTHER)
Admission: EM | Admit: 2022-08-29 | Discharge: 2022-08-29 | Disposition: A | Discharge status: Home / Self Care | Payer: Medicare Other | Attending: Emergency Medicine | Admitting: Emergency Medicine

## 2022-08-29 ENCOUNTER — Encounter (HOSPITAL_BASED_OUTPATIENT_CLINIC_OR_DEPARTMENT_OTHER): Payer: Self-pay | Admitting: Emergency Medicine

## 2022-08-29 DIAGNOSIS — R55 Syncope and collapse: Secondary | ICD-10-CM

## 2022-08-29 DIAGNOSIS — I491 Atrial premature depolarization: Secondary | ICD-10-CM | POA: Insufficient documentation

## 2022-08-29 DIAGNOSIS — Z1152 Encounter for screening for COVID-19: Secondary | ICD-10-CM | POA: Diagnosis not present

## 2022-08-29 DIAGNOSIS — Z7982 Long term (current) use of aspirin: Secondary | ICD-10-CM | POA: Diagnosis not present

## 2022-08-29 DIAGNOSIS — I517 Cardiomegaly: Secondary | ICD-10-CM | POA: Diagnosis not present

## 2022-08-29 DIAGNOSIS — I951 Orthostatic hypotension: Secondary | ICD-10-CM | POA: Insufficient documentation

## 2022-08-29 DIAGNOSIS — R42 Dizziness and giddiness: Secondary | ICD-10-CM | POA: Diagnosis not present

## 2022-08-29 DIAGNOSIS — Z79899 Other long term (current) drug therapy: Secondary | ICD-10-CM | POA: Insufficient documentation

## 2022-08-29 LAB — CBC WITH DIFFERENTIAL/PLATELET
Abs Immature Granulocytes: 0.03 10*3/uL (ref 0.00–0.07)
Basophils Absolute: 0 10*3/uL (ref 0.0–0.1)
Basophils Relative: 0 %
Eosinophils Absolute: 0.2 10*3/uL (ref 0.0–0.5)
Eosinophils Relative: 2 %
HCT: 36.8 % — ABNORMAL LOW (ref 39.0–52.0)
Hemoglobin: 12.2 g/dL — ABNORMAL LOW (ref 13.0–17.0)
Immature Granulocytes: 0 %
Lymphocytes Relative: 28 %
Lymphs Abs: 2.7 10*3/uL (ref 0.7–4.0)
MCH: 29 pg (ref 26.0–34.0)
MCHC: 33.2 g/dL (ref 30.0–36.0)
MCV: 87.4 fL (ref 80.0–100.0)
Monocytes Absolute: 1.2 10*3/uL — ABNORMAL HIGH (ref 0.1–1.0)
Monocytes Relative: 12 %
Neutro Abs: 5.4 10*3/uL (ref 1.7–7.7)
Neutrophils Relative %: 58 %
Platelets: 419 10*3/uL — ABNORMAL HIGH (ref 150–400)
RBC: 4.21 MIL/uL — ABNORMAL LOW (ref 4.22–5.81)
RDW: 16.1 % — ABNORMAL HIGH (ref 11.5–15.5)
WBC: 9.5 10*3/uL (ref 4.0–10.5)
nRBC: 0 % (ref 0.0–0.2)

## 2022-08-29 LAB — BASIC METABOLIC PANEL
Anion gap: 8 (ref 5–15)
BUN: 16 mg/dL (ref 8–23)
CO2: 26 mmol/L (ref 22–32)
Calcium: 8.7 mg/dL — ABNORMAL LOW (ref 8.9–10.3)
Chloride: 98 mmol/L (ref 98–111)
Creatinine, Ser: 0.85 mg/dL (ref 0.61–1.24)
GFR, Estimated: >60 mL/min (ref >60)
Glucose, Bld: 98 mg/dL (ref 70–99)
Potassium: 3.9 mmol/L (ref 3.5–5.1)
Sodium: 132 mmol/L — ABNORMAL LOW (ref 135–145)

## 2022-08-29 LAB — RESP PANEL BY RT-PCR (RSV, FLU A&B, COVID)  RVPGX2
Influenza A by PCR: NEGATIVE
Influenza B by PCR: NEGATIVE
Resp Syncytial Virus by PCR: NEGATIVE
SARS Coronavirus 2 by RT PCR: NEGATIVE

## 2022-08-29 LAB — URINALYSIS, ROUTINE W REFLEX MICROSCOPIC
Bilirubin Urine: NEGATIVE
Glucose, UA: NEGATIVE mg/dL
Hgb urine dipstick: NEGATIVE
Ketones, ur: NEGATIVE mg/dL
Leukocytes,Ua: NEGATIVE
Nitrite: NEGATIVE
Protein, ur: NEGATIVE mg/dL
Specific Gravity, Urine: 1.01 (ref 1.005–1.030)
pH: 6 (ref 5.0–8.0)

## 2022-08-29 LAB — TROPONIN I (HIGH SENSITIVITY): Troponin I (High Sensitivity): 5 ng/L (ref <18)

## 2022-08-29 MED ORDER — SODIUM CHLORIDE 0.9 % IV BOLUS
1000.0000 mL | Freq: Once | INTRAVENOUS | Status: AC
  Administered 2022-08-29: 1000 mL via INTRAVENOUS

## 2022-08-29 NOTE — ED Provider Notes (Signed)
[facility] [facility] EMERGENCY DEPARTMENT Provider Note   CSN: [id number] Arrival date & time: 08/29/22  1258     History  Chief Complaint  Patient presents with   Dizziness   *Daughter was present to assist in history  [name] is a 77 y.o. male with complex medical history presented for syncope that started this morning.  Patient stated this happened when he tried to stand up after lying down and that he was having presyncopal symptoms such as feeling warm which warrants him to grab a railing.  Patient denied any Falls or head trauma.  Patient stated he may be dehydrated and went to see his primary care Deland Pretty and was sent here for orthostatic hypotension and presyncope.  Patient denied chest pain, shortness of breath, abdominal pain, dysuria, recent illnesses, fevers, nausea/vomiting/diarrhea, melena/hematochezia, hemoptysis, hematemesis, dizziness.  Last ECHO: 03/2022 LVEF 60-65%  Home Medications Prior to Admission medications   Medication Sig Start Date End Date Taking? Authorizing Provider  aspirin EC 81 MG tablet Take 81 mg by mouth daily. Swallow whole.    [provider]  Calcium-Phosphorus-Vitamin D (CITRACAL +D3 PO) Take 1 tablet by mouth daily.    [provider]  cholecalciferol (VITAMIN D) 25 MCG (1000 UT) tablet Take 1,000 Units by mouth at bedtime.     [provider]  diclofenac Sodium (VOLTAREN ARTHRITIS PAIN) 1 % GEL Apply 2 g topically 2 (two) times daily as needed (arthritis pain).    [provider]  folic acid (FOLVITE) 1 MG tablet Take 1 mg by mouth daily. 04/26/22   [provider]  ibuprofen (ADVIL) 200 MG tablet Take 400 mg by mouth every 6 (six) hours as needed for mild pain.    [provider]  loratadine (CLARITIN) 10 MG tablet Take 1 tablet (10 mg total) by mouth daily. 01/01/21   Georgette Shell, MD  magnesium hydroxide (MILK OF MAGNESIA) 400 MG/5ML suspension Take 15 mLs by mouth  daily as needed for mild constipation.    [provider]  Nintedanib (OFEV) 100 MG CAPS Take 1 capsule (100 mg total) by mouth 2 (two) times daily. 07/23/22   Mannam, Hart Robinsons, MD  omeprazole (PRILOSEC) 10 MG capsule Take 10 mg by mouth every morning. 12/01/20   [provider]  polyethylene glycol (MIRALAX / GLYCOLAX) 17 g packet Take 17 g by mouth daily. Patient taking differently: Take 17 g by mouth daily as needed for mild constipation. 01/01/21   Georgette Shell, MD  rosuvastatin (CRESTOR) 10 MG tablet Take 10 mg by mouth daily. 09/27/20   [provider]  sertraline (ZOLOFT) 50 MG tablet Take 50 mg by mouth daily.    [provider]      Allergies    Patient has no known allergies.    Review of Systems   Review of Systems  Neurological:  Positive for dizziness.  See HPI  Physical Exam Updated Vital Signs BP (!) 143/78   Pulse 68   Temp 98.4 F (36.9 C) (Oral)   Resp 15   Ht 6' (1.829 m)   Wt 79.8 kg   SpO2 99%   BMI 23.87 kg/m  Physical Exam Vitals and nursing note reviewed.  Constitutional:      General: He is not in acute distress.    Appearance: He is well-developed.  HENT:     Head: Normocephalic and atraumatic.     Nose: Nose normal.     Mouth/Throat:  Mouth: Mucous membranes are dry.  Eyes:     Extraocular Movements: Extraocular movements intact.     Conjunctiva/sclera: Conjunctivae normal.     Pupils: Pupils are equal, round, and reactive to light.  Cardiovascular:     Rate and Rhythm: Normal rate and regular rhythm.     Pulses: Normal pulses.     Heart sounds: Normal heart sounds. No murmur heard.    Comments: 2+ bilateral radial/posterior tibialis pulses with regular rate Pulmonary:     Effort: Pulmonary effort is normal. No respiratory distress.     Breath sounds: Normal breath sounds.  Abdominal:     General: Abdomen is flat. Bowel sounds are normal.     Palpations: Abdomen is soft.     Tenderness: There  is no abdominal tenderness. There is no guarding or rebound.  Musculoskeletal:        General: No swelling.     Cervical back: Normal range of motion and neck supple.     Comments: Patient has limited range of motion due to arthritis and this is considered baseline  Skin:    General: Skin is warm and dry.     Capillary Refill: Capillary refill takes less than 2 seconds.  Neurological:     General: No focal deficit present.     Mental Status: He is alert and oriented to person, place, and time.     Cranial Nerves: Cranial nerves 2-12 are intact.     Sensory: Sensation is intact.     Motor: Motor function is intact.     Coordination: Coordination is intact.     Gait: Gait is intact.  Psychiatric:        Mood and Affect: Mood normal.     ED Results / Procedures / Treatments   Labs (all labs ordered are listed, but only abnormal results are displayed) Labs Reviewed  BASIC METABOLIC PANEL - Abnormal; Notable for the following components:      Result Value   Sodium 132 (*)    Calcium 8.7 (*)    All other components within normal limits  CBC WITH DIFFERENTIAL/PLATELET - Abnormal; Notable for the following components:   RBC 4.21 (*)    Hemoglobin 12.2 (*)    HCT 36.8 (*)    RDW 16.1 (*)    Platelets 419 (*)    Monocytes Absolute 1.2 (*)    All other components within normal limits  RESP PANEL BY RT-PCR (RSV, FLU A&B, COVID)  [id number]  URINALYSIS, ROUTINE W REFLEX MICROSCOPIC  TROPONIN I (HIGH SENSITIVITY)    EKG EKG Interpretation  Date/Time:  Wednesday August 29 2022 15:15:28 EST Ventricular Rate:  69 PR Interval:  187 QRS Duration: 113 QT Interval:  423 QTC Calculation: 454 R Axis:   -50 Text Interpretation: Sinus rhythm Left anterior fascicular block Abnormal R-wave progression, late transition Left ventricular hypertrophy Confirmed by Margaretmary Eddy [phone]) on 08/29/2022 3:48:46 PM  Radiology No results found.  Procedures Procedures    Medications Ordered in  ED Medications  sodium chloride 0.9 % bolus 1,000 mL (0 mLs Intravenous Stopped 08/29/22 1529)    ED Course/ Medical Decision Making/ A&P                             Medical Decision Making  [name] 77 y.o. presented today for presyncope. Working DDx that I considered at this time includes, but not limited to, orthostatic hypotension, CVA/TIA, ACS, GIB, anemia.  Review of prior external notes: 06/18/22 H&P  Unique Tests and My Interpretation:  EKG: [place] rhythm with PACs, artifact is noted; similar to previous EKGs UA: Negative Troponin: Unremarkable Respiratory panel: Negative BMP: Unremarkable CBC with differential: Unremarkable  Discussion with Independent Historian: Daughter  Discussion of Management of Tests: none  Risk: Low:  - based on diagnostic testing/clinical impression and treatment plan   Risk Stratification Score: [place] Syncope Risk Score: -2  Staffed with Margaretmary Eddy, MD  R/o DDx: CVA/TIA: negative neuro exam and symptoms are only present with standing standing up GIB: Patient is not anemic and denied any melena or hematochezia to suggest this is the cause ACS: Negative troponin and negative EKG Anemia: Patient is not anemic on CBC UTI: Urine was negative  Plan: Patient presented with presyncopal symptoms.  After a reassuring physical exam and EKG and unremarkable neurologic exam I suspect this is most likely orthostatic hypotension due to dehydration.  Patient is asymptomatic in the ED at the moment with reassuring vitals.  I will obtain basic labs and give IV fluids and recheck patient.  After receiving fluids patient's blood pressure has improved.  After receiving all the labs and having a reassuring physical exam and recheck patient appears stable for discharge at this time with primary care follow-up.  Patient's blood pressure has improved after receiving IV fluids and patient is asymptomatic.  After speaking with patient and daughter  patient verbalized agreement to this plan.         Final Clinical Impression(s) / ED Diagnoses Final diagnoses:  Orthostatic hypotension  Near syncope    Rx / DC Orders ED Discharge Orders     None         Elvina Sidle 08/29/22 1600    Fransico Meadow, MD 08/31/22 1425

## 2022-08-29 NOTE — ED Triage Notes (Signed)
Pt reports dizziness x 1 mo, worse today; sts he is usually dizzy when he first gets up in the morning, but it goes away; today it has persisted; saw PCP today and they sent him here

## 2022-08-29 NOTE — Discharge Instructions (Signed)
Please follow-up with your PCP regarding recent ER visit.  Please continue to take in adequate fluids to avoid being dehydrated.  If symptoms worsen please return to ER.

## 2022-08-29 NOTE — ED Notes (Signed)
Daughter states the patient's blood pressure at the PCP office was 78/50.

## 2022-09-20 ENCOUNTER — Telehealth: Payer: Self-pay | Admitting: Pulmonary Disease

## 2022-09-20 DIAGNOSIS — M25561 Pain in right knee: Secondary | ICD-10-CM | POA: Diagnosis not present

## 2022-09-20 DIAGNOSIS — M25539 Pain in unspecified wrist: Secondary | ICD-10-CM | POA: Diagnosis not present

## 2022-09-20 DIAGNOSIS — M1990 Unspecified osteoarthritis, unspecified site: Secondary | ICD-10-CM | POA: Diagnosis not present

## 2022-09-20 DIAGNOSIS — M059 Rheumatoid arthritis with rheumatoid factor, unspecified: Secondary | ICD-10-CM | POA: Diagnosis not present

## 2022-09-20 DIAGNOSIS — M79643 Pain in unspecified hand: Secondary | ICD-10-CM | POA: Diagnosis not present

## 2022-09-20 DIAGNOSIS — M25511 Pain in right shoulder: Secondary | ICD-10-CM | POA: Diagnosis not present

## 2022-09-21 NOTE — Telephone Encounter (Signed)
[employer] paperwork received and completed.  Will place paperwork in pharmacy team box to follow up.

## 2022-09-28 ENCOUNTER — Other Ambulatory Visit: Payer: Self-pay | Admitting: Gastroenterology

## 2022-10-01 ENCOUNTER — Telehealth: Payer: Self-pay

## 2022-10-01 NOTE — Telephone Encounter (Signed)
Pt's daughter dropped off [employer] PAP application for pt, however financial documents were not included. ATC pt to discuss, LVM requesting return call. Direct office number provided, will await f/u.

## 2022-10-03 ENCOUNTER — Telehealth: Payer: Self-pay | Admitting: Pulmonary Disease

## 2022-10-03 NOTE — Telephone Encounter (Signed)
Fax received from Dr. Ronnette Juniper with [facility] to perform a colonoscopy on patient.  Patient needs surgery clearance. Surgery is 11/13/22. Patient was seen on 07/20/22. Office protocol is a risk assessment can be sent to surgeon if patient has been seen in 60 days or less.   Pt due for rov with Dr Vaughan Browner in March 2024. Needs appt. I called him and left msg to call back and schedule appt. Will update this note and fax notes to [facility] once pt is seen.  Marland Kitchen

## 2022-10-16 DIAGNOSIS — E785 Hyperlipidemia, unspecified: Secondary | ICD-10-CM | POA: Diagnosis not present

## 2022-10-16 DIAGNOSIS — Z125 Encounter for screening for malignant neoplasm of prostate: Secondary | ICD-10-CM | POA: Diagnosis not present

## 2022-10-17 ENCOUNTER — Ambulatory Visit (HOSPITAL_COMMUNITY): Payer: Medicare Other | Attending: Cardiology

## 2022-10-17 DIAGNOSIS — R0609 Other forms of dyspnea: Secondary | ICD-10-CM | POA: Diagnosis not present

## 2022-10-17 LAB — ECHOCARDIOGRAM COMPLETE
Area-P 1/2: 2.71 cm2
P 1/2 time: 1298 msec
S' Lateral: 2.3 cm

## 2022-10-17 NOTE — Telephone Encounter (Signed)
Financial documents dropped off and placed in pharmacy box.

## 2022-10-18 NOTE — Telephone Encounter (Signed)
Received notification from [facility] regarding a prior authorization for [place]. Authorization has been APPROVED from 08/13/22 to 08/13/23. Approval letter sent to scan center.  Authorization # [id number]  Submitted Patient Assistance RENEWAL Application to [place] for OFEV along with provider portion, patient portion, med list, insurance card copy, PA and income documents. Will update patient when we receive a response.  Fax# [phone] Phone# [phone]  Knox Saliva, PharmD, MPH, BCPS, CPP Clinical Pharmacist (Rheumatology and Pulmonology)

## 2022-10-18 NOTE — Telephone Encounter (Signed)
Submitted a Prior Authorization request to [facility] for OFEV via CoverMyMeds. Will update once we receive a response.  Key: Samuel Jester, PharmD, MPH, BCPS, CPP Clinical Pharmacist (Rheumatology and Pulmonology)

## 2022-10-23 ENCOUNTER — Telehealth: Payer: Self-pay

## 2022-10-23 DIAGNOSIS — K219 Gastro-esophageal reflux disease without esophagitis: Secondary | ICD-10-CM | POA: Diagnosis not present

## 2022-10-23 DIAGNOSIS — I700 Atherosclerosis of aorta: Secondary | ICD-10-CM | POA: Diagnosis not present

## 2022-10-23 DIAGNOSIS — M059 Rheumatoid arthritis with rheumatoid factor, unspecified: Secondary | ICD-10-CM | POA: Diagnosis not present

## 2022-10-23 DIAGNOSIS — F411 Generalized anxiety disorder: Secondary | ICD-10-CM | POA: Diagnosis not present

## 2022-10-23 DIAGNOSIS — I739 Peripheral vascular disease, unspecified: Secondary | ICD-10-CM | POA: Diagnosis not present

## 2022-10-23 DIAGNOSIS — Z0000 Encounter for general adult medical examination without abnormal findings: Secondary | ICD-10-CM | POA: Diagnosis not present

## 2022-10-23 DIAGNOSIS — E785 Hyperlipidemia, unspecified: Secondary | ICD-10-CM | POA: Diagnosis not present

## 2022-10-23 DIAGNOSIS — R972 Elevated prostate specific antigen [PSA]: Secondary | ICD-10-CM

## 2022-10-23 DIAGNOSIS — J309 Allergic rhinitis, unspecified: Secondary | ICD-10-CM | POA: Diagnosis not present

## 2022-10-23 DIAGNOSIS — K4190 Unilateral femoral hernia, without obstruction or gangrene, not specified as recurrent: Secondary | ICD-10-CM

## 2022-10-23 DIAGNOSIS — M8580 Other specified disorders of bone density and structure, unspecified site: Secondary | ICD-10-CM | POA: Diagnosis not present

## 2022-10-23 DIAGNOSIS — J84112 Idiopathic pulmonary fibrosis: Secondary | ICD-10-CM | POA: Diagnosis not present

## 2022-10-23 DIAGNOSIS — D890 Polyclonal hypergammaglobulinemia: Secondary | ICD-10-CM

## 2022-10-23 HISTORY — DX: Unilateral femoral hernia, without obstruction or gangrene, not specified as recurrent: K41.90

## 2022-10-23 HISTORY — DX: Polyclonal hypergammaglobulinemia: D89.0

## 2022-10-23 HISTORY — DX: Elevated prostate specific antigen (PSA): R97.20

## 2022-10-23 NOTE — Telephone Encounter (Signed)
-----   Message from Park Liter, MD sent at 10/17/2022  8:25 PM EST ----- Echocardiogram showed normal left ventricle ejection fraction, mild mitral valve regurgitation which is 99, overall looks good

## 2022-10-23 NOTE — Telephone Encounter (Signed)
Patient notified of results.

## 2022-10-24 NOTE — Telephone Encounter (Signed)
Can we call this patient and get him scheduled before 11/13/22 for surgical clearance. I have tried several times to call.   Thank you

## 2022-10-25 ENCOUNTER — Encounter: Payer: Self-pay | Admitting: Cardiology

## 2022-10-25 ENCOUNTER — Ambulatory Visit: Payer: Medicare Other | Attending: Cardiology | Admitting: Cardiology

## 2022-10-25 VITALS — BP 112/72 | HR 86 | Ht 72.0 in | Wt 176.0 lb

## 2022-10-25 DIAGNOSIS — R0609 Other forms of dyspnea: Secondary | ICD-10-CM | POA: Diagnosis not present

## 2022-10-25 DIAGNOSIS — J849 Interstitial pulmonary disease, unspecified: Secondary | ICD-10-CM

## 2022-10-25 DIAGNOSIS — I272 Pulmonary hypertension, unspecified: Secondary | ICD-10-CM | POA: Insufficient documentation

## 2022-10-25 DIAGNOSIS — I700 Atherosclerosis of aorta: Secondary | ICD-10-CM | POA: Insufficient documentation

## 2022-10-25 NOTE — Progress Notes (Signed)
Cardiology Office Note:    Date:  10/25/2022   ID:  Jonathan, Davis 1946/01/19, MRN [id number]  PCP:  Deland Pretty, MD  Cardiologist:  Jenne Campus, MD    Referring MD: Deland Pretty, MD   No chief complaint on file. Fin  History of Present Illness:    [name] is a 77 y.o. male  with past medical history significant for essential hypertension, dyslipidemia, remote history of smoking quit smoking in the 90s, peripheral vascular disease status post femoral per femoral bypass graft done many years ago. He is recently diagnosed with idiopathic pulmonary fibrosis, CT showed enlargement of the pulmonary artery with suspicion of pulmonary hypertension he was referred to [place] for possible cardiac catheterization to rule it out. I did review echocardiogram done in 2-21 which showed no evidence of pulmonary hypertension, right ventricle was normal size and function, I did review his  Comes today to my office for follow-up still report to have some shortness of breath but no more than previously.  Denies having swelling of lower extremities.  He just got echocardiogram performed which did not show any significant tricuspid regurgitation, right ventricle is normal size and function.  Again no evidence of pulmonary hypertension.  Overall he is doing well  Past Medical History:  Diagnosis Date   Allergic rhinitis 07/12/2020   Basal cell carcinoma 02/21/2011   left inner cheek, lateral (CX35FU)   Body mass index (BMI) 30.0-30.9, adult 11/01/2020   Combined forms of age-related cataract of left eye 01/26/2020   Combined forms of age-related cataract of right eye 01/14/2020   Cramp in lower leg associated with rest 11/01/2020   Depression    DOE (dyspnea on exertion) 02/03/2020   Onset summer 2020   -  02/03/2020   Walked RA  3 laps @ approx 221f each @   fast pace  stopped due to end of study,    sats 94% at end and minimally sob  - Collagen vascular profile 02/03/2020 >>>  Neg        Lab Results  Component  Value  Date     ESRSEDRATE  18  02/03/2020  - Allergy profile 02/03/20 >  Eos 0.4 /  IgE  1344   - HSP serology 02/03/2020  Neg   - HRCT 02/05/2020  compatible with inters   ED (erectile dysfunction) of organic origin 11/01/2020   Elevated PSA 10/23/2022   Epididymal cyst 11/01/2020   Exacerbation of asthma 11/01/2020   Family history of ischemic heart disease (IHD) 11/01/2020   Family history of malignant neoplasm of gastrointestinal tract 11/01/2020   Femoral hernia of left side 10/23/2022   Gastroesophageal reflux disease without esophagitis 11/01/2020   Generalized anxiety disorder 11/01/2020   GERD (gastroesophageal reflux disease)    Hardening of the aorta (main artery of the heart) ([place] 11/01/2020   Hearing loss 11/01/2020   Hematochezia 03/26/2022   History of adenomatous polyp of colon 11/01/2020   History of hematuria 11/01/2020   Hyperlipidemia    ILD (interstitial lung disease) ([facility] 07/12/2020   Iliac artery occlusion, left (HSac 01/13/2019   Incarcerated incisional hernia with SBO 12/28/2020   Incarcerated ventral hernia 12/28/2020   IPF (idiopathic pulmonary fibrosis) ([facility] 08/25/2021   Long term (current) use of aspirin 11/01/2020   Osteoarthritis of knee 11/01/2020   Osteopenia    Other skin changes due to chronic exposure to nonionizing radiation 11/01/2020   Parietoalveolar pneumopathy ([facility] [place] 11/01/2020   Peripheral vascular disease ([facility] 11/01/2020  Personal history of (healed) traumatic fracture 11/01/2020   Personal history of other malignant neoplasm of skin 11/01/2020   Polyclonal gammopathy 10/23/2022   Pulmonary hypertension ([place]) 03/26/2022   Recurrent ventral incisional hernia 12/28/2020   Right knee pain 03/02/2015   SBO (small bowel obstruction) ([place]) 10/26/2020   Squamous cell carcinoma of skin 07/21/2019   in situ- fornt center scalp (CX35FU)   Squamous cell carcinoma, face 12/13/2018   in situ- left forehead(CX35FU)    Stopped smoking 11/01/2020   Ventral hernia 11/01/2020    Past Surgical History:  Procedure Laterality Date   ABDOMINAL WALL MESH  REMOVAL  05/29/2008   COLONOSCOPY WITH PROPOFOL N/A 06/19/2022   Procedure: COLONOSCOPY WITH PROPOFOL;  Surgeon: Ronnette Juniper, MD;  Location: [facility];  Service: Gastroenterology;  Laterality: N/A;   FEMORAL-FEMORAL BYPASS GRAFT Left 01/13/2019   Procedure: BYPASS GRAFT FEMORAL-FEMORAL ARTERY;  Surgeon: Angelia Mould, MD;  Location: [facility];  Service: Vascular;  Laterality: Left;   INGUINAL HERNIA REPAIR N/A 12/28/2020   Procedure: LAPAROSCOPIC VENTERAL HERNIA REPAIR WITH MESH, LYSIS OF ADHESIONS;  Surgeon: Michael Boston, MD;  Location: [facility];  Service: General;  Laterality: N/A;   LUMBAR LAMINECTOMY     ORCHIECTOMY Left 01/12/2019   Procedure: LEFT INGUINAL RADICAL ORCHIECTOMY;  Surgeon: Lucas Mallow, MD;  Location: [facility];  Service: Urology;  Laterality: Left;   POLYPECTOMY  06/19/2022   Procedure: POLYPECTOMY;  Surgeon: Ronnette Juniper, MD;  Location: [facility];  Service: Gastroenterology;;   ROTATOR CUFF REPAIR Right    SMALL INTESTINE SURGERY  05/29/2008   SBO into mesh - SB resection, mesh removal & primary hernia repair   UMBILICAL HERNIA REPAIR  2009   open with Ventralex mesh patch    Current Medications: Current Meds  Medication Sig   Calcium-Phosphorus-Vitamin D (CITRACAL +D3 PO) Take 1 tablet by mouth daily.   cholecalciferol (VITAMIN D) 25 MCG (1000 UT) tablet Take 1,000 Units by mouth at bedtime.    Cyanocobalamin (VITAMIN B12 PO) Take 1 tablet by mouth daily.   diclofenac Sodium (VOLTAREN ARTHRITIS PAIN) 1 % GEL Apply 2 g topically 2 (two) times daily as needed (arthritis pain).   ferrous sulfate 325 (65 FE) MG tablet Take 325 mg by mouth daily with breakfast.   folic acid (FOLVITE) 1 MG tablet Take 1 mg by mouth daily.   loratadine (CLARITIN) 10 MG tablet Take 1 tablet (10 mg total) by mouth daily.    methotrexate (RHEUMATREX) 2.5 MG tablet Take 10 mg by mouth once a week.   Nintedanib (OFEV) 100 MG CAPS Take 1 capsule (100 mg total) by mouth 2 (two) times daily.   omeprazole (PRILOSEC) 10 MG capsule Take 10 mg by mouth every morning.   rosuvastatin (CRESTOR) 10 MG tablet Take 10 mg by mouth daily.   sertraline (ZOLOFT) 100 MG tablet Take 100 mg by mouth daily.     Allergies:   Patient has no known allergies.   Social History   Socioeconomic History   Marital status: Widowed    Spouse name: Ridwan Loo   Number of children: 2   Years of education: 12th grade   Highest education level: Not on file  Occupational History   Occupation: grading  Tobacco Use   Smoking status: Former    Packs/day: 1.00    Years: 28.00    Additional pack years: 0.00    Total pack years: 28.00    Types: Cigarettes   Smokeless tobacco: Never  Tobacco comments:    quit 1992  Vaping Use   Vaping Use: Never used  Substance and Sexual Activity   Alcohol use: Yes    Alcohol/week: 6.0 standard drinks of alcohol    Types: 6 Cans of beer per week    Comment: beer occasional   Drug use: Never   Sexual activity: Not Currently  Other Topics Concern   Not on file  Social History Narrative   Not on file   Social Determinants of Health   Financial Resource Strain: Not on file  Food Insecurity: Not on file  Transportation Needs: Not on file  Physical Activity: Not on file  Stress: Not on file  Social Connections: Not on file     Family History: The patient's family history is negative for Other. ROS:   Please see the history of present illness.    All 14 point review of systems negative except as described per history of present illness  EKGs/Labs/Other Studies Reviewed:      Recent Labs: 07/20/2022: ALT 13; NT-Pro BNP 546 08/29/2022: BUN 16; Creatinine, Ser 0.85; Hemoglobin 12.2; Platelets 419; Potassium 3.9; Sodium 132  Recent Lipid Panel No results found for: "CHOL", "TRIG", "HDL",  "CHOLHDL", "VLDL", "LDLCALC", "LDLDIRECT"  Physical Exam:    VS:  BP 112/72 (BP Location: Right Arm, Patient Position: Sitting)   Pulse 86   Ht 6' (1.829 m)   Wt 176 lb (79.8 kg)   SpO2 97%   BMI 23.87 kg/m     Wt Readings from Last 3 Encounters:  10/25/22 176 lb (79.8 kg)  08/29/22 176 lb (79.8 kg)  07/20/22 186 lb (84.4 kg)     GEN:  Well nourished, well developed in no acute distress HEENT: Normal NECK: No JVD; No carotid bruits LYMPHATICS: No lymphadenopathy CARDIAC: RRR, no murmurs, no rubs, no gallops RESPIRATORY:  Clear to auscultation without rales, wheezing or rhonchi  ABDOMEN: Soft, non-tender, non-distended MUSCULOSKELETAL:  No edema; No deformity  SKIN: Warm and dry LOWER EXTREMITIES: no swelling NEUROLOGIC:  Alert and oriented x 3 PSYCHIATRIC:  Normal affect   ASSESSMENT:    1. Pulmonary hypertension (HCC)   2. Hardening of the aorta (main artery of the heart) (HCC)   3. ILD (interstitial lung disease) ([place])   4. DOE (dyspnea on exertion)    PLAN:    In order of problems listed above:  Pulmonary hypertension he does not have this diagnosis 3 echocardiogram done did not show any evidence of pulmonary hypertension or right ventricular enlargement.  I think will drop the diagnosis. Hardening of the aorta.  He is on statin which I will continue.  He is also on antiplatelet therapy which I will continue. Idiopathic pulmonary fibrosis that we follow-up excellently like always by our pulmonary team. Dyspnea on exertion most likely related to his pulmonary fibrosis.   Medication Adjustments/Labs and Tests Ordered: Current medicines are reviewed at length with the patient today.  Concerns regarding medicines are outlined above.  No orders of the defined types were placed in this encounter.  Medication changes: No orders of the defined types were placed in this encounter.   Signed, Park Liter, MD, [facility] 10/25/2022 2:57 PM    [facility]

## 2022-10-25 NOTE — Patient Instructions (Signed)

## 2022-10-29 NOTE — Telephone Encounter (Signed)
Called and left voicemail for patient to call office back to schedule surgical clearance office visit.

## 2022-10-31 DIAGNOSIS — K439 Ventral hernia without obstruction or gangrene: Secondary | ICD-10-CM | POA: Diagnosis not present

## 2022-11-01 NOTE — H&P (Signed)
History of Present Illness General:         77-year-old male history of pulmonary fibrosis, pulmonary hypertension, GERD, arthritis, incarcerated hernia status postrepair, presents for evaluation of rectal bleeding.        Last colonoscopy 10/2013 done in [facility]: Dr. Hayes, internal hemorrhoids, repeat 5 years        Colonoscopy 03/2011 showed tubular adenoma        Echocardiogram 03/2022 shows ejection fraction 60 to 65%        patient was put on OFEB for lungs. side effect is rectal bleeding.        he was on medication and every time he would have a bowel movement it was diarrhea and bleeding.        states he stopped taking medication two weeks ago but still having some bleeding. feels like bleeding has improved some, but still present.        states if he strains or picks up something heavy he would rectal bleeding that would last a few days but then it would quit.        with being on medication it became daily.        reports very small amount of bright red bleeding in toliet and in stool.        denies weight loss        father with colon cancer age 75.        denies nausea/vomiting        denies change in bowel habits.  Current Medications Taking Sertraline HCl 100 MG Tablet 1 tablet Orally Once a day Not-Taking Aspir-81 81 MG Tablet Delayed Release 1 tablet Orally Once a day Medication List reviewed and reconciled with the patient Past Medical History      Hypercholesterolemia.      Chronic Anxiety.      Allergic Rhinitis.      Rotator Cuff Tendonitis.      ED.      Arthuritis.      Scar tissue in lungs. Surgical History       EGD- Dr Orr 2000       Colonoscopy- Dr Orr 2008       Seborrheic cyst removal       Low back surgery       Umbilical hernia repair       Small bowel resection (complication of umbilical hernia repair) 05/2008       Colonoscopy- 5 year repeat V12.72 03/2011,10/2013       rotator cuff 2013 Family History Father: deceased, Colon cancer  at age 79, MI at age 52, diagnosed with Colon cancer Mother: deceased 102 yrs, dementia Brother 1: deceased 52 yrs, MI Sister 1: alive 79 yrs, Healthy Sister 2: deceased 79 yrs, dementia 2 daughter(s) . Neg family history of colon polyps and liver diease. Social History General:  Tobacco use  cigarettes:  Former smoker, Tobacco history last updated  03/28/2022. no EXPOSURE TO PASSIVE SMOKE. Alcohol: yes, 1-2 per week, beer. no Recreational drug use. Allergies N.K.D.A. Hospitalization/Major Diagnostic Procedure Not in the past year 03/2022 Review of Systems GI PROCEDURE:         no Pacemaker/ AICD, no.  no Artificial heart valves.  no MI/heart attack.  no Abnormal heart rhythm.  no Angina.  no CVA.  no Hypertension.  no Hypotension.  no Asthma, COPD.  no Sleep apnea.  no Seizure disorders.  no Artificial joints.  no Severe DJD.  no Diabetes.    no Significant headaches.  no Vertigo.  Depression/anxiety  YES.  no Abnormal bleeding.  no Kidney Disease.  no Liver disease, no.  no Chance of pregnancy.  no Blood transfusion.  no Method of Birth Control.  no Birth control pills.   Vital Signs Wt 190.4, Wt change -37.6 lbs, Ht 72, BMI 25.82, Temp 97.5, Pulse sitting 98, BP sitting 111/74. Examination Gastroenterology Exam:        GENERAL APPEARANCE: Well developed, well nourished, no active distress, pleasant, no acute distress . EYES: Lids and conjunctiva normal. Sclera normal, pupils equal and reactive. RESPIRATORY Breath sounds normal. Respiration even and unlabored. CARDIOVASCULAR normal RRR w/o murmers or gallops. No peripheral edema. RECTAL: declined by patient, defer to colonoscopy. SKIN Warm and dry. PSYCHIATRIC Alert and oriented x3, mood and affect appear normal..  Assessments 1. Rectal bleeding - K62.5 (Primary) 2. Family history of colon cancer in father - Z80.0 3. Hemorrhoids, unspecified hemorrhoid type - K64.9 Treatment 1. Rectal bleeding       IMAGING: Colonoscopy              Clinical Notes: Rectal bleeding due to new medication. Still having rectal bleeding off of medication. History of internal hemorrhoids with last colonoscopy done for rectal bleeding. Most likely internal hemorrhoids, though with history of tubular adenomas and family history of colon cancer we will get colonoscopy for further evaluation. We will do trial of hydrocortisone suppositories if rectal bleeding is due to internal hemorrhoids.  Colonoscopy will done at hospital for extra precaution due to pulmonary hypertension. Adequate ejection fraction. We will get pulmonology clearance.                          2. Family history of colon cancer in father       IMAGING: Colonoscopy               Clinical Notes: due for repeat every 5 years. last colonoscopy 2015. age 73 when his father was diagnosed.                             3. Hemorrhoids, unspecified hemorrhoid type  Start Hydrocortisone Acetate Suppository, 25 MG, 1 suppository, Rectal, twice a day, 14 days, 28 suppositories, Refills 0 Notes: Please do sitz baths, increase fiber or add benefiber, increase water and increase activity. Will send in hydrocortisone suppository, cheapest with [employer] from [employer], [employer], [employer] or [employer] if your insurance does not pay for it.   

## 2022-11-06 ENCOUNTER — Encounter (HOSPITAL_COMMUNITY): Payer: Self-pay | Admitting: Gastroenterology

## 2022-11-06 DIAGNOSIS — N400 Enlarged prostate without lower urinary tract symptoms: Secondary | ICD-10-CM | POA: Diagnosis not present

## 2022-11-06 DIAGNOSIS — R972 Elevated prostate specific antigen [PSA]: Secondary | ICD-10-CM | POA: Diagnosis not present

## 2022-11-06 NOTE — Progress Notes (Signed)
Attempted to obtain medical history via telephone, unable to reach at this time. HIPAA compliant voicemail message left requesting return call to pre surgical testing department. 

## 2022-11-07 ENCOUNTER — Encounter: Payer: Self-pay | Admitting: Nurse Practitioner

## 2022-11-07 ENCOUNTER — Ambulatory Visit (INDEPENDENT_AMBULATORY_CARE_PROVIDER_SITE_OTHER): Payer: Medicare Other

## 2022-11-07 ENCOUNTER — Ambulatory Visit (INDEPENDENT_AMBULATORY_CARE_PROVIDER_SITE_OTHER): Payer: Medicare Other | Admitting: Nurse Practitioner

## 2022-11-07 VITALS — BP 112/70 | HR 72 | Ht 72.0 in | Wt 178.8 lb

## 2022-11-07 DIAGNOSIS — Z01811 Encounter for preprocedural respiratory examination: Secondary | ICD-10-CM

## 2022-11-07 DIAGNOSIS — Z5181 Encounter for therapeutic drug level monitoring: Secondary | ICD-10-CM | POA: Diagnosis not present

## 2022-11-07 DIAGNOSIS — J8410 Pulmonary fibrosis, unspecified: Secondary | ICD-10-CM | POA: Diagnosis not present

## 2022-11-07 DIAGNOSIS — J84112 Idiopathic pulmonary fibrosis: Secondary | ICD-10-CM | POA: Diagnosis not present

## 2022-11-07 DIAGNOSIS — J302 Other seasonal allergic rhinitis: Secondary | ICD-10-CM

## 2022-11-07 NOTE — Assessment & Plan Note (Signed)
Interstitial lung disease and probable UIP pattern with progressive phenotype, consistent with IPF.  He was started on antifibrotic's with Ofev due to his declining pulmonary function testing and progression on imaging.  He had briefly held Ofev due to bloody stools, which have resolved. He still has some occasional bouts of diarrhea, controlled with PRN imodium. He does have a slight increase in a cough, felt to be related to postnasal drainage/allergies. Breathing stable. Check CMET today for medication monitoring. He has had a 10 pound weight loss. Will see what his colonoscopy reveals. Advised him this could be related to Ofev use. Encouraged him to monitor at home. Schedule repeat PFTs at follow up.   Patient Instructions  Continue Ofev 1 capsule Twice daily. Take with food  Continue Claritin 1 tab daily  Flonase 1-2 sprays each nostril daily for allergies  Chest x ray today  Up out of bed as soon as possible after surgery. Use incentive spirometer 10 times an hour while awake during the immediate recovery period  Monitor your weight at home and call us if you continue to drop weight   Follow up in 3 months with Dr. Vaughan Browner or [name] Abel Ra,NP. If symptoms worsen, please contact office for sooner follow up or seek emergency care.

## 2022-11-07 NOTE — Telephone Encounter (Signed)
OV notes and clearance form have been faxed back to [facility]. Nothing further needed at this time.  

## 2022-11-07 NOTE — Assessment & Plan Note (Signed)
Flare of allergies. Using claritin, which provides some relief. Will add on intranasal steroid.

## 2022-11-07 NOTE — Progress Notes (Signed)
@Patient  ID: [name], male    DOB: 08/18/1945, 77 y.o.   MRN: [id number]  Chief Complaint  Patient presents with   Follow-up    Pt f/u he is having a colonoscopy next week, he states that his breathing is baseline.     Referring provider: Deland Pretty, MD  HPI: 77 year old male, former smoker followed for IPF.  He is a patient of Dr. Matilde Davis and last seen in office 03/26/2022.  Past medical history significant for PVD, allergic rhinitis, history of asthma, GERD, arthritis, GAD, and history of incarcerated hernia status postrepair.  TEST/EVENTS:  04/21/2021 PFTs: FVC 62, FEV1 71, ratio 85, TLC 59, DLCO 66 02/20/2022 HRCT chest: Atherosclerosis is present.  Dilated right and left pulmonary arteries.  No LAD.  Subpleural and basilar predominant reticulation, groundglass, traction bronchiectasis/bronchiolectasis and honeycombing consistent with UIP.  Mildly progressive from 2022.  There are calcified granulomas.  No air trapping. 03/13/2022: EF 60-65%.  Mild LVH.  G1 DD.  Reduced TAPSE.  RV systolic function is mildly reduced.  03/01/2022: OV with Dr. Vaughan Davis for follow-up.  Continues on Ofev.  Tolerating it well except for occasional diarrhea.  [facility] evaluated [facility] for [facility] pain and is on prednisone taper.  He was post to get PFTs but wanted to reschedule it as prednisone makes him jittery and he cannot give adequate effort.  Reviewed clinic note from Dr. Dossie Davis with rheumatology from earlier this month.  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  Possibly will need oral steroids.  Plan for repeat PFTs in 3 months.  Continued on Ofev daily and Imodium as needed.  Echocardiogram for evaluation of pulmonary hypertension; which showed some right heart strain concerning for pulmonary hypertension.  Referred to cardiology for right heart cath.  03/26/2022: OV with Jonathan Axtman NP for acute visit. He had contacted the office on 8/4 due to  some blood in his stool.  He was worried this was related to the Ofev.  He was instructed to stop Ofev and schedule acute visit in the office.  Today, he reports that he has been off the Ofev for around 10 days now.  He is still having blood in his stools for which she has an appointment next week with GI to discuss colonoscopy.  Has not noticed any significant change since stopping the Ofev.  He denies any lightheadedness or dizziness, nausea/vomiting, abdominal pain, anorexia.  He has had some weight loss over the past few months of around 20 pounds.  He would like to stay off the Ofev for now until he figures out exactly what is going on.  Reports that his breathing overall is stable.  He does get winded with long distances and does not do as much as he used to; however, overall the symptoms are stable for quite some time now.  He was seen by Dr. Dossie Davis with rheumatology since we saw him last and has been placed on methotrexate for rheumatoid arthritis with positive RA factor.  He does feel like this has started to help his joint symptoms some.  His recent echocardiogram showed some mild RV dysfunction with reduced TAPSE.  He was referred to cardiology for possible right heart cath and will see them on 10/5.  07/20/2022: OV with Dr. Vaughan Davis. Started having diarrhea with Ofev and blood in his stools so he had briefly stopped Ofev. He was evaluated by GI with findings of hemorrhoids   11/07/2022: Today - surgical clearance  Patient presents today with his daughter for surgical clearance prior to colonoscopy. He has been feeling Davis well since he was here last. His breathing is stable. He has a slight increase in cough, which he attributes to his allergies and postnasal drip. Primarily dry. He's taking claritin for this. No sinus sprays. He denies any fevers, chills, hemoptysis, chest congestion. He's still on his Ofev. He has occasional diarrhea on this but overall stable. No nausea or vomiting. Appetite is good.  He has had around a 10 pound weight loss since he was here last. He's not routinely monitoring at home.    No Known Allergies  Immunization History  Administered Date(s) Administered   Fluad Quad(high Dose 65+) 05/13/2019, 07/12/2020, 08/25/2021   Influenza, High Dose Seasonal PF 05/26/2015, 05/07/2016   Influenza, Quadrivalent, Recombinant, Inj, Pf 06/18/2017, 06/30/2018, 07/07/2019   Influenza-Unspecified 07/20/2013, 04/27/2014, 06/13/2018   PFIZER Comirnaty(Gray Top)Covid-19 Tri-Sucrose Vaccine 09/13/2020   PFIZER(Purple Top)SARS-COV-2 Vaccination 09/26/2019, 10/18/2019   Pneumococcal Conjugate-13 04/22/2014   Pneumococcal-Unspecified 10/18/2009   Tdap 09/17/2006, 06/04/2016   Zoster, Live 09/17/2006    Past Medical History:  Diagnosis Date   Allergic rhinitis 07/12/2020   Basal cell carcinoma 02/21/2011   left inner cheek, lateral (CX35FU)   Body mass index (BMI) 30.0-30.9, adult 11/01/2020   Combined forms of age-related cataract of left eye 01/26/2020   Combined forms of age-related cataract of right eye 01/14/2020   Cramp in lower leg associated with rest 11/01/2020   Depression    DOE (dyspnea on exertion) 02/03/2020   Onset summer 2020   -  02/03/2020   Walked RA  3 laps @ approx 272ft each @   fast pace  stopped due to end of study,    sats 94% at end and minimally sob  - Collagen vascular profile 02/03/2020 >>>  Neg       Lab Results  Component  Value  Date     ESRSEDRATE  18  02/03/2020  - Allergy profile 02/03/20 >  Eos 0.4 /  IgE  1344   - HSP serology 02/03/2020  Neg   - HRCT 02/05/2020  compatible with inters   ED (erectile dysfunction) of organic origin 11/01/2020   Elevated PSA 10/23/2022   Epididymal cyst 11/01/2020   Exacerbation of asthma 11/01/2020   Family history of ischemic heart disease (IHD) 11/01/2020   Family history of malignant neoplasm of gastrointestinal tract 11/01/2020   Femoral hernia of left side 10/23/2022   Gastroesophageal reflux disease  without esophagitis 11/01/2020   Generalized anxiety disorder 11/01/2020   GERD (gastroesophageal reflux disease)    Hardening of the aorta (main artery of the heart) ([place]) 11/01/2020   Hearing loss 11/01/2020   Hematochezia 03/26/2022   History of adenomatous polyp of colon 11/01/2020   History of hematuria 11/01/2020   Hyperlipidemia    ILD (interstitial lung disease) ([place]) 07/12/2020   Iliac artery occlusion, left ([place]) 01/13/2019   Incarcerated incisional hernia with SBO 12/28/2020   Incarcerated ventral hernia 12/28/2020   IPF (idiopathic pulmonary fibrosis) ([place]) 08/25/2021   Long term (current) use of aspirin 11/01/2020   Osteoarthritis of knee 11/01/2020   Osteopenia    Other skin changes due to chronic exposure to nonionizing radiation 11/01/2020   Parietoalveolar pneumopathy ([place]) 11/01/2020   Peripheral vascular disease ([place]) 11/01/2020   Personal history of (healed) traumatic fracture 11/01/2020   Personal history of other malignant neoplasm of skin 11/01/2020   Polyclonal gammopathy 10/23/2022   Pulmonary hypertension ([place]) 03/26/2022  Recurrent ventral incisional hernia 12/28/2020   Right knee pain 03/02/2015   SBO (small bowel obstruction) ([place]) 10/26/2020   Squamous cell carcinoma of skin 07/21/2019   in situ- fornt center scalp (CX35FU)   Squamous cell carcinoma, face 12/13/2018   in situ- left forehead(CX35FU)   Stopped smoking 11/01/2020   Ventral hernia 11/01/2020    Tobacco History: Social History   Tobacco Use  Smoking Status Former   Packs/day: 1.00   Years: 28.00   Additional pack years: 0.00   Total pack years: 28.00   Types: Cigarettes  Smokeless Tobacco Never  Tobacco Comments   quit 1992   Counseling given: Not Answered Tobacco comments: quit 1992   Outpatient Medications Prior to Visit  Medication Sig Dispense Refill   aspirin EC 81 MG tablet Take 81 mg by mouth daily. Swallow whole.     Calcium-Phosphorus-Vitamin D (CITRACAL +D3  PO) Take 1 tablet by mouth every evening.     Cholecalciferol 125 MCG (5000 UT) TABS Take 1,000 Units by mouth every evening.     Cyanocobalamin (VITAMIN B12 PO) Take 1,000 mcg by mouth in the morning.     ferrous sulfate 325 (65 FE) MG tablet Take 325 mg by mouth every evening.     folic acid (FOLVITE) 1 MG tablet Take 1 mg by mouth every evening.     loratadine (CLARITIN) 10 MG tablet Take 1 tablet (10 mg total) by mouth daily.     methotrexate (RHEUMATREX) 2.5 MG tablet Take 10 mg by mouth every Wednesday.     Nintedanib (OFEV) 100 MG CAPS Take 1 capsule (100 mg total) by mouth 2 (two) times daily. 180 capsule 0   omeprazole (PRILOSEC) 10 MG capsule Take 10 mg by mouth daily as needed (indigestion/heartburn.).     rosuvastatin (CRESTOR) 10 MG tablet Take 10 mg by mouth in the morning.     sertraline (ZOLOFT) 100 MG tablet Take 100 mg by mouth every evening.     diclofenac Sodium (VOLTAREN ARTHRITIS PAIN) 1 % GEL Apply 2 g topically 2 (two) times daily as needed (arthritis pain).     No facility-administered medications prior to visit.     Review of Systems:   Constitutional: + weight loss, fatigue. No night sweats, fevers, chills, lassitude. HEENT: No headaches, difficulty swallowing, tooth/dental problems, or sore throat. No sneezing, itching, ear ache. +nasal congestion, post nasal drip CV:  No chest pain, orthopnea, PND, swelling in lower extremities, anasarca, dizziness, palpitations, syncope Resp: +shortness of breath with exertion (baseline); dry cough. No excess mucus or change in color of mucus. No productive or non-productive. No hemoptysis. No wheezing.  No chest wall deformity GI:  +diarrhea. No heartburn, indigestion, abdominal pain, nausea, vomiting, loss of appetite, bloody stools GU: No dysuria, change in color of urine, urgency or frequency.  No flank pain, no hematuria  Skin: No rash, lesions, ulcerations MSK:  +chronic joint pain.  No decreased range of motion.  No back  pain. Neuro: No dizziness or lightheadedness.  Psych: No depression or anxiety. Mood stable.     Physical Exam:  BP 112/70   Pulse 72   Ht 6' (1.829 m)   Wt 178 lb 12.8 oz (81.1 kg)   SpO2 96%   BMI 24.25 kg/m   GEN: Pleasant, interactive, well-appearing; in no acute distress HEENT:  Normocephalic and atraumatic.  PERRLA. Sclera white. Nasal turbinates boggy, moist and patent bilaterally. Clear rhinorrhea present. Oropharynx pink and moist, without exudate or edema. No lesions,  ulcerations NECK:  Supple w/ fair ROM.  Thyroid symmetrical with no goiter or nodules palpated. No lymphadenopathy.   CV: RRR, no m/r/g, no peripheral edema. Pulses intact, +2 bilaterally. No cyanosis, pallor or clubbing. PULMONARY:  Unlabored, regular breathing. Clear bilaterally A&P w/o wheezes/rales/rhonchi. No accessory muscle use. No dullness to percussion. GI: BS present and normoactive. Soft, non-tender to palpation. No organomegaly or masses detected.  MSK: No erythema, warmth or tenderness. Cap refil <2 sec all extrem. Neuro: A/Ox3. No focal deficits noted.   Skin: Warm, no lesions or rashe Psych: Normal affect and behavior. Judgement and thought content appropriate.     Lab Results:  CBC    Component Value Date/Time   WBC 9.5 08/29/2022 1407   RBC 4.21 (L) 08/29/2022 1407   HGB 12.2 (L) 08/29/2022 1407   HCT 36.8 (L) 08/29/2022 1407   PLT 419 (H) 08/29/2022 1407   MCV 87.4 08/29/2022 1407   MCH 29.0 08/29/2022 1407   MCHC 33.2 08/29/2022 1407   RDW 16.1 (H) 08/29/2022 1407   LYMPHSABS 2.7 08/29/2022 1407   MONOABS 1.2 (H) 08/29/2022 1407   EOSABS 0.2 08/29/2022 1407   BASOSABS 0.0 08/29/2022 1407    BMET    Component Value Date/Time   NA 132 (L) 08/29/2022 1407   NA 136 07/20/2022 1622   K 3.9 08/29/2022 1407   CL 98 08/29/2022 1407   CO2 26 08/29/2022 1407   GLUCOSE 98 08/29/2022 1407   BUN 16 08/29/2022 1407   BUN 12 07/20/2022 1622   CREATININE 0.85 08/29/2022 1407    CALCIUM 8.7 (L) 08/29/2022 1407   GFRNONAA >60 08/29/2022 1407   GFRAA >60 02/02/2019 1520    BNP No results found for: "BNP"   Imaging:  DG Chest 2 View  Result Date: 11/07/2022 CLINICAL DATA:  Idiopathic pulmonary fibrosis. EXAM: CHEST - 2 VIEW COMPARISON:  February 02, 2019, high-resolution CT February 20, 2022 FINDINGS: The heart size and mediastinal contours are stable. Changes of pulmonary fibrosis are identified in bilateral lungs probably not significantly changed compared to prior CT of February 20, 2022. No focal pneumonia, pulmonary edema or pleural effusion is identified. The lungs are hyperinflated. The visualized skeletal structures are stable. IMPRESSION: Changes of pulmonary fibrosis are identified in bilateral lungs probably not significantly changed compared to prior CT of February 20, 2022. Electronically Signed   By: Abelardo Diesel M.D.   On: 11/07/2022 09:57   ECHOCARDIOGRAM COMPLETE  Result Date: 10/17/2022    ECHOCARDIOGRAM REPORT   Patient Name:   [name] Date of Exam: 10/17/2022 Medical Rec #:  [id number]         Height:       72.0 in Accession #:    [id number]        Weight:       176.0 lb Date of Birth:  14-Mar-1946        BSA:          2.018 m Patient Age:    35 years          BP:           117/83 mmHg Patient Gender: M                 HR:           59 bpm. Exam Location:  [address] Procedure: 2D Echo, 3D Echo, Cardiac Doppler and Color Doppler Indications:    R06.00 DOE  History:  Patient has prior history of Echocardiogram examinations, most                 recent 07/27/2022. PVD; Risk Factors:Hypertension and                 Dyslipidemia.  Sonographer:    Marygrace Drought RCS Referring Phys: [phone] [place]  1. Left ventricular ejection fraction, by estimation, is 65 to 70%. The left ventricle has normal function. The left ventricle has no regional wall motion abnormalities. There is mild left ventricular hypertrophy. Left ventricular diastolic  parameters were normal.  2. Right ventricular systolic function is normal. The right ventricular size is normal. There is normal pulmonary artery systolic pressure.  3. The mitral valve is normal in structure. Mild mitral valve regurgitation.  4. The aortic valve is tricuspid. Aortic valve regurgitation is mild. Aortic valve sclerosis/calcification is present, without any evidence of aortic stenosis. Comparison(s): The left ventricular function is unchanged. FINDINGS  Left Ventricle: Left ventricular ejection fraction, by estimation, is 65 to 70%. The left ventricle has normal function. The left ventricle has no regional wall motion abnormalities. The left ventricular internal cavity size was normal in size. There is  mild left ventricular hypertrophy. Left ventricular diastolic parameters were normal. Right Ventricle: The right ventricular size is normal. Right vetricular wall thickness was not assessed. Right ventricular systolic function is normal. There is normal pulmonary artery systolic pressure. The tricuspid regurgitant velocity is 2.25 m/s, and with an assumed right atrial pressure of 3 mmHg, the estimated right ventricular systolic pressure is 123XX123 mmHg. Left Atrium: Left atrial size was normal in size. Right Atrium: Right atrial size was normal in size. Pericardium: Trivial pericardial effusion is present. Mitral Valve: The mitral valve is normal in structure. Mild mitral valve regurgitation. Tricuspid Valve: The tricuspid valve is normal in structure. Tricuspid valve regurgitation is mild. Aortic Valve: The aortic valve is tricuspid. Aortic valve regurgitation is mild. Aortic regurgitation PHT measures 1298 msec. Aortic valve sclerosis/calcification is present, without any evidence of aortic stenosis. Pulmonic Valve: The pulmonic valve was normal in structure. Pulmonic valve regurgitation is mild. Aorta: The aortic root is normal in size and structure. IAS/Shunts: No atrial level shunt detected by color  flow Doppler.  LEFT VENTRICLE PLAX 2D LVIDd:         3.60 cm   Diastology LVIDs:         2.30 cm   LV e' medial:    5.77 cm/s LV PW:         1.00 cm   LV E/e' medial:  11.4 LV IVS:        1.20 cm   LV e' lateral:   11.20 cm/s LVOT diam:     1.80 cm   LV E/e' lateral: 5.9 LV SV:         52 LV SV Index:   26 LVOT Area:     2.54 cm                           3D Volume EF:                          3D EF:        51 %                          LV EDV:  116 ml                          LV ESV:       56 ml                          LV SV:        59 ml RIGHT VENTRICLE RV Basal diam:  4.65 cm RV Mid diam:    4.00 cm RV S prime:     6.74 cm/s TAPSE (M-mode): 1.8 cm RVSP:           23.2 mmHg LEFT ATRIUM             Index        RIGHT ATRIUM           Index LA diam:        3.70 cm 1.83 cm/m   RA Pressure: 3.00 mmHg LA Vol (A2C):   47.5 ml 23.54 ml/m  RA Area:     18.10 cm LA Vol (A4C):   31.8 ml 15.76 ml/m  RA Volume:   44.50 ml  22.05 ml/m LA Biplane Vol: 39.2 ml 19.43 ml/m  AORTIC VALVE LVOT Vmax:   92.60 cm/s LVOT Vmean:  63.400 cm/s LVOT VTI:    0.205 m AI PHT:      1298 msec  AORTA Ao Root diam: 3.70 cm Ao Asc diam:  3.30 cm MITRAL VALVE               TRICUSPID VALVE MV Area (PHT):             TR Peak grad:   20.2 mmHg MV Decel Time:             TR Vmax:        225.00 cm/s MV E velocity: 66.00 cm/s  Estimated RAP:  3.00 mmHg MV A velocity: 71.30 cm/s  RVSP:           23.2 mmHg MV E/A ratio:  0.93                            SHUNTS                            Systemic VTI:  0.20 m                            Systemic Diam: 1.80 cm Dorris Carnes MD Electronically signed by Dorris Carnes MD Signature Date/Time: 10/17/2022/2:12:28 PM    Final          Latest Ref Rng & Units 07/20/2022    2:54 PM 04/21/2021    2:59 PM 04/11/2020   12:46 PM  PFT Results  FVC-Pre L 2.43  2.79  3.06   FVC-Predicted Pre % 55  62  68   FVC-Post L 2.35  2.68  3.10   FVC-Predicted Post % 53  60  69   Pre FEV1/FVC % % 77  83  82   Post FEV1/FCV  % % 84  85  83   FEV1-Pre L 1.86  2.32  2.50   FEV1-Predicted Pre % 59  71  76   FEV1-Post L 1.98  2.29  2.56   DLCO uncorrected ml/min/mmHg 14.84  17.29  21.55   DLCO  [facility]% % 57  66  81   DLCO corrected ml/min/mmHg 14.84  17.29  21.55   DLCO COR %Predicted % 57  66  81   DLVA Predicted % 110  102  114   TLC L 3.76  4.36  4.75   TLC % Predicted % 51  59  64   RV % Predicted % 52  66  69     No results found for: "NITRICOXIDE"      Assessment & Plan:   IPF (idiopathic pulmonary fibrosis) (HCC) Interstitial lung disease and probable UIP pattern with progressive phenotype, consistent with IPF.  He was started on antifibrotic's with Ofev due to his declining pulmonary function testing and progression on imaging.  He had briefly held Ofev due to bloody stools, which have resolved. He still has some occasional bouts of diarrhea, controlled with PRN imodium. He does have a slight increase in a cough, felt to be related to postnasal drainage/allergies. Breathing stable. Check CMET today for medication monitoring. He has had a 10 pound weight loss. Will see what his colonoscopy reveals. Advised him this could be related to Ofev use. Encouraged him to monitor at home. Schedule repeat PFTs at follow up.   Patient Instructions  Continue Ofev 1 capsule Twice daily. Take with food  Continue Claritin 1 tab daily  Flonase 1-2 sprays each nostril daily for allergies  Chest x ray today  Up out of bed as soon as possible after surgery. Use incentive spirometer 10 times an hour while awake during the immediate recovery period  Monitor your weight at home and call us if you continue to drop weight   Follow up in 3 months with Dr. Vaughan Davis or [name] Rad Gramling,NP. If symptoms worsen, please contact office for sooner follow up or seek emergency care.    Preoperative respiratory examination Moderate high risk. Factors that increase the risk for postoperative pulmonary complications are IPF, age    Respiratory complications generally occur in 1% of ASA Class I patients, 5% of ASA Class II and 10% of ASA Class III-IV patients These complications rarely result in mortality and include postoperative pneumonia, atelectasis, pulmonary embolism, ARDS and increased time requiring postoperative mechanical ventilation.   Overall, I recommend proceeding with the surgery if the risk for respiratory complications are outweighed by the potential benefits. This will need to be discussed between the patient and surgeon.   To reduce risks of respiratory complications, I recommend: --Pre- and post-operative incentive spirometry performed frequently while awake --Short duration of surgery as much as possible and avoid paralytic if possible --OOB, encourage mobility post-op   1) RISK FOR PROLONGED MECHANICAL VENTILAION - > 48h  1A) Arozullah - Prolonged mech ventilation risk Arozullah Postperative Pulmonary Risk Score - for mech ventilation dependence >48h [employer], Ann Surg 2000, major non-cardiac surgery) Comment Score  Type of surgery - abd ao aneurysm (27), thoracic (21), neurosurgery / upper abdominal / vascular (21), neck (11) colonoscopy 2  Emergency Surgery - (11)  0  ALbumin < 3 or poor nutritional state - (9)  0  BUN > 30 -  (8)  0  Partial or completely dependent functional status - (7)  0  COPD -  (6) IPF 6  Age - 60 to 69 (4), > 70  (6) 78 6  TOTAL  14  Risk Stratifcation scores  - < 10 (0.5%), 11-19 (1.8%), 20-27 (4.2%), 28-40 (10.1%), >40 (26.6%)  1.8%     Allergic rhinitis Flare of allergies. Using  claritin, which provides some relief. Will add on intranasal steroid.    I spent 32 minutes of dedicated to the care of this patient on the date of this encounter to include pre-visit review of records, face-to-face time with the patient discussing conditions above, post visit ordering of testing, clinical documentation with the electronic health record, making appropriate  referrals as documented, and communicating necessary findings to members of the patients care team.  Clayton Bibles, NP 11/07/2022  Pt aware and understands NP's role.

## 2022-11-07 NOTE — Patient Instructions (Signed)
Continue Ofev 1 capsule Twice daily. Take with food  Continue Claritin 1 tab daily  Flonase 1-2 sprays each nostril daily for allergies  Chest x ray today  Up out of bed as soon as possible after surgery. Use incentive spirometer 10 times an hour while awake during the immediate recovery period  Monitor your weight at home and call us if you continue to drop weight   Follow up in 3 months with Dr. Vaughan Browner or [name] Yancy Knoble,NP. If symptoms worsen, please contact office for sooner follow up or seek emergency care.

## 2022-11-07 NOTE — Assessment & Plan Note (Signed)
Moderate high risk. Factors that increase the risk for postoperative pulmonary complications are IPF, age   Respiratory complications generally occur in 1% of ASA Class I patients, 5% of ASA Class II and 10% of ASA Class III-IV patients These complications rarely result in mortality and include postoperative pneumonia, atelectasis, pulmonary embolism, ARDS and increased time requiring postoperative mechanical ventilation.   Overall, I recommend proceeding with the surgery if the risk for respiratory complications are outweighed by the potential benefits. This will need to be discussed between the patient and surgeon.   To reduce risks of respiratory complications, I recommend: --Pre- and post-operative incentive spirometry performed frequently while awake --Short duration of surgery as much as possible and avoid paralytic if possible --OOB, encourage mobility post-op   1) RISK FOR PROLONGED MECHANICAL VENTILAION - > 48h  1A) Arozullah - Prolonged mech ventilation risk Arozullah Postperative Pulmonary Risk Score - for mech ventilation dependence >48h [employer], Ann Surg 2000, major non-cardiac surgery) Comment Score  Type of surgery - abd ao aneurysm (27), thoracic (21), neurosurgery / upper abdominal / vascular (21), neck (11) colonoscopy 2  Emergency Surgery - (11)  0  ALbumin < 3 or poor nutritional state - (9)  0  BUN > 30 -  (8)  0  Partial or completely dependent functional status - (7)  0  COPD -  (6) IPF 6  Age - 60 to 69 (4), > 70  (6) 78 6  TOTAL  14  Risk Stratifcation scores  - < 10 (0.5%), 11-19 (1.8%), 20-27 (4.2%), 28-40 (10.1%), >40 (26.6%)  1.8%

## 2022-11-07 NOTE — Telephone Encounter (Signed)
Called [employer] for update on Ofev PAP renewal application. Per rep, the income documents are missing. They require 1040-SR for 2023. I spoke with patient's daughter Maudie Mercury. She will work on acquiring docs and return to clinic once retrieved  Knox Saliva, PharmD, MPH, BCPS, CPP Clinical Pharmacist (Rheumatology and Pulmonology)

## 2022-11-12 ENCOUNTER — Telehealth: Payer: Self-pay | Admitting: Pulmonary Disease

## 2022-11-12 NOTE — Telephone Encounter (Signed)
Envelope received this morning in Dr. Matilde Bash box containing 2022 1040-SR tax return for patient.  Envelope with tax info give to pharmacy team for Ofev assistance.   Nothing further at this time.

## 2022-11-12 NOTE — Telephone Encounter (Signed)
Resubmitted PAP with copy of 1040-SR attached. Will await response.

## 2022-11-13 ENCOUNTER — Ambulatory Visit (HOSPITAL_BASED_OUTPATIENT_CLINIC_OR_DEPARTMENT_OTHER): Payer: Medicare Other | Admitting: Certified Registered Nurse Anesthetist

## 2022-11-13 ENCOUNTER — Other Ambulatory Visit: Payer: Self-pay

## 2022-11-13 ENCOUNTER — Encounter (HOSPITAL_COMMUNITY): Payer: Self-pay | Admitting: Gastroenterology

## 2022-11-13 ENCOUNTER — Encounter (HOSPITAL_COMMUNITY)
Admission: RE | Disposition: A | Discharge status: Home / Self Care | Payer: Self-pay | Source: Ambulatory Visit | Attending: Gastroenterology

## 2022-11-13 ENCOUNTER — Ambulatory Visit (HOSPITAL_COMMUNITY): Payer: Medicare Other | Admitting: Certified Registered Nurse Anesthetist

## 2022-11-13 ENCOUNTER — Ambulatory Visit (HOSPITAL_COMMUNITY)
Admission: RE | Admit: 2022-11-13 | Discharge: 2022-11-13 | Disposition: A | Discharge status: Home / Self Care | Payer: Medicare Other | Source: Ambulatory Visit | Attending: Gastroenterology | Admitting: Gastroenterology

## 2022-11-13 DIAGNOSIS — I739 Peripheral vascular disease, unspecified: Secondary | ICD-10-CM | POA: Insufficient documentation

## 2022-11-13 DIAGNOSIS — Z87891 Personal history of nicotine dependence: Secondary | ICD-10-CM | POA: Diagnosis not present

## 2022-11-13 DIAGNOSIS — K219 Gastro-esophageal reflux disease without esophagitis: Secondary | ICD-10-CM | POA: Diagnosis not present

## 2022-11-13 DIAGNOSIS — K648 Other hemorrhoids: Secondary | ICD-10-CM | POA: Diagnosis not present

## 2022-11-13 DIAGNOSIS — Z800 Family history of malignant neoplasm of digestive organs: Secondary | ICD-10-CM | POA: Diagnosis not present

## 2022-11-13 DIAGNOSIS — K625 Hemorrhage of anus and rectum: Secondary | ICD-10-CM | POA: Diagnosis not present

## 2022-11-13 DIAGNOSIS — R972 Elevated prostate specific antigen [PSA]: Secondary | ICD-10-CM

## 2022-11-13 DIAGNOSIS — F418 Other specified anxiety disorders: Secondary | ICD-10-CM

## 2022-11-13 DIAGNOSIS — K5730 Diverticulosis of large intestine without perforation or abscess without bleeding: Secondary | ICD-10-CM | POA: Insufficient documentation

## 2022-11-13 HISTORY — PX: COLONOSCOPY WITH PROPOFOL: SHX5780

## 2022-11-13 SURGERY — COLONOSCOPY WITH PROPOFOL
Anesthesia: Monitor Anesthesia Care

## 2022-11-13 MED ORDER — LACTATED RINGERS IV SOLN
INTRAVENOUS | Status: AC | PRN
  Administered 2022-11-13: 1000 mL via INTRAVENOUS

## 2022-11-13 MED ORDER — PHENYLEPHRINE HCL (PRESSORS) 10 MG/ML IV SOLN
INTRAVENOUS | Status: DC | PRN
  Administered 2022-11-13: 120 ug via INTRAVENOUS
  Administered 2022-11-13: 80 ug via INTRAVENOUS
  Administered 2022-11-13: 160 ug via INTRAVENOUS
  Administered 2022-11-13: 120 ug via INTRAVENOUS

## 2022-11-13 MED ORDER — PROPOFOL 1000 MG/100ML IV EMUL
INTRAVENOUS | Status: AC
  Filled 2022-11-13: qty 100

## 2022-11-13 MED ORDER — PROPOFOL 10 MG/ML IV BOLUS
INTRAVENOUS | Status: DC | PRN
  Administered 2022-11-13: 20 mg via INTRAVENOUS

## 2022-11-13 MED ORDER — SODIUM CHLORIDE 0.9 % IV SOLN: INTRAVENOUS | Status: DC

## 2022-11-13 MED ORDER — PROPOFOL 500 MG/50ML IV EMUL
INTRAVENOUS | Status: DC | PRN
  Administered 2022-11-13: 100 ug/kg/min via INTRAVENOUS

## 2022-11-13 MED ORDER — ONDANSETRON HCL 4 MG/2ML IJ SOLN
INTRAMUSCULAR | Status: DC | PRN
  Administered 2022-11-13: 4 mg via INTRAVENOUS

## 2022-11-13 SURGICAL SUPPLY — 22 items

## 2022-11-13 NOTE — Anesthesia Preprocedure Evaluation (Addendum)
Anesthesia Evaluation  Patient identified by MRN, date of birth, ID band  Reviewed: Allergy & Precautions, NPO status , Patient's Chart, lab work & pertinent test results  History of Anesthesia Complications Negative for: history of anesthetic complications  Airway Mallampati: II  TM Distance: >3 FB Neck ROM: Full    Dental  (+) Upper Dentures, Missing, Dental Advisory Given, Poor Dentition   Pulmonary former smoker   breath sounds clear to auscultation       Cardiovascular + Peripheral Vascular Disease   Rhythm:Regular Rate:Normal  10/17/2022 ECHO: EF 65-70%, normal LVF, mild LVH, normal RVF, mild MR   Neuro/Psych   Anxiety Depression       GI/Hepatic Neg liver ROS,GERD  Controlled,,  Endo/Other  negative endocrine ROS    Renal/GU negative Renal ROS     Musculoskeletal   Abdominal   Peds  Hematology negative hematology ROS (+)   Anesthesia Other Findings   Reproductive/Obstetrics                             Anesthesia Physical Anesthesia Plan  ASA: 3  Anesthesia Plan: MAC   Post-op Pain Management: Minimal or no pain anticipated   Induction:   PONV Risk Score and Plan: 1 and Treatment may vary due to age or medical condition  Airway Management Planned: Natural Airway and Simple Face Mask  Additional Equipment: None  Intra-op Plan:   Post-operative Plan:   Informed Consent: I have reviewed the patients History and Physical, chart, labs and discussed the procedure including the risks, benefits and alternatives for the proposed anesthesia with the patient or authorized representative who has indicated his/her understanding and acceptance.     Dental advisory given  Plan Discussed with: CRNA and Surgeon  Anesthesia Plan Comments:        Anesthesia Quick Evaluation

## 2022-11-13 NOTE — Transfer of Care (Signed)
Immediate Anesthesia Transfer of Care Note  Patient: [name]  Procedure(s) Performed: Procedure(s): COLONOSCOPY WITH PROPOFOL (N/A)  Patient Location: PACU  Anesthesia Type:MAC  Level of Consciousness: Patient easily awoken, sedated, comfortable, cooperative, following commands, responds to stimulation.   Airway & Oxygen Therapy: Patient spontaneously breathing, ventilating well, oxygen via simple oxygen mask.  Post-op Assessment: Report given to PACU RN, vital signs reviewed and stable, moving all extremities.   Post vital signs: Reviewed and stable.  Complications: No apparent anesthesia complications  Last Vitals:  Vitals Value Taken Time  BP 92/58   Temp    Pulse 70 11/13/22 0953  Resp 19 11/13/22 0953  SpO2 100 % 11/13/22 0953  Vitals shown include unvalidated device data.  Last Pain:  Vitals:   11/13/22 0838  TempSrc: Tympanic  PainSc: 0-No pain         Complications: No notable events documented.

## 2022-11-13 NOTE — Interval H&P Note (Signed)
History and Physical Interval Note: 76/male with rectal bleeding, family history of colon cancer in his father at 46, history of colonic adenoma, for a colonoscopy with propofol.  11/13/2022 9:16 AM  [name]  has presented today for Colonoscopy, with the diagnosis of Personal hx of colon polyps Z86.010, Family hx of colon cancer (father) Z80.0.  The various methods of treatment have been discussed with the patient and family. After consideration of risks, benefits and other options for treatment, the patient has consented to  Procedure(s): COLONOSCOPY WITH PROPOFOL (N/A) as a surgical intervention.  The patient's history has been reviewed, patient examined, no change in status, stable for surgery.  I have reviewed the patient's chart and labs.  Questions were answered to the patient's satisfaction.     Ronnette Juniper

## 2022-11-13 NOTE — Op Note (Signed)
[facility] Patient Name: [name] Procedure Date: 11/13/2022 MRN: [id number] Attending MD: Ronnette Juniper , MD, [id number] Date of Birth: 03-04-1946 CSN: [id number] Age: 77 Admit Type: Outpatient Procedure:                Colonoscopy Indications:              Rectal bleeding, , Last colonoscopy: 2015, father                            had colon cancer at 53 Providers:                Ronnette Juniper, MD, Vladimir Crofts, RN, Luan Moore,                            Technician, Heide Scales, CRNA Referring MD:             Tressia Miners Medicines:                Monitored Anesthesia Care Complications:            No immediate complications. Estimated Blood Loss:     Estimated blood loss: none. Procedure:                Pre-Anesthesia Assessment:                           - Prior to the procedure, a History and Physical                            was performed, and patient medications and                            allergies were reviewed. The patient's tolerance of                            previous anesthesia was also reviewed. The risks                            and benefits of the procedure and the sedation                            options and risks were discussed with the patient.                            All questions were answered, and informed consent                            was obtained. Prior Anticoagulants: The patient has                            taken no anticoagulant or antiplatelet agents                            except for aspirin. ASA Grade Assessment: III - A  patient with severe systemic disease. After                            reviewing the risks and benefits, the patient was                            deemed in satisfactory condition to undergo the                            procedure.                           After obtaining informed consent, the colonoscope                            was passed under direct  vision. Throughout the                            procedure, the patient's blood pressure, pulse, and                            oxygen saturations were monitored continuously. The                            [id number] [id number]) Olympus colonoscope was                            introduced through the anus and advanced to the the                            terminal ileum. The colonoscopy was performed                            without difficulty. The patient tolerated the                            procedure well. The quality of the bowel                            preparation was adequate to identify polyps greater                            than 5 mm in size. The terminal ileum, ileocecal                            valve, appendiceal orifice, and rectum were                            photographed. Scope In: 9:29:32 AM Scope Out: 9:47:33 AM Scope Withdrawal Time: 0 hours 11 minutes 2 seconds  Total Procedure Duration: 0 hours 18 minutes 1 second  Findings:      Hemorrhoids were found on perianal exam.      Non-bleeding internal hemorrhoids were found during retroflexion.      The terminal ileum appeared normal.  Multiple medium-mouthed and small-mouthed diverticula were found in the       sigmoid colon.      The exam was otherwise without abnormality. Impression:               - Hemorrhoids found on perianal exam.                           - Non-bleeding internal hemorrhoids.                           - The examined portion of the ileum was normal.                           - Diverticulosis in the sigmoid colon.                           - The examination was otherwise normal.                           - No specimens collected. Moderate Sedation:      Patient did not receive moderate sedation for this procedure, but       instead received monitored anesthesia care. Recommendation:           - Patient has a contact number available for                            emergencies.  The signs and symptoms of potential                            delayed complications were discussed with the                            patient. Return to normal activities tomorrow.                            Written discharge instructions were provided to the                            patient.                           - High fiber diet.                           - Continue present medications.                           - Repeat colonoscopy is not recommended for                            surveillance. Procedure Code(s):        --- Professional ---                           [phone], Colonoscopy, flexible; diagnostic, including  collection of specimen(s) by brushing or washing,                            when performed (separate procedure) Diagnosis Code(s):        --- Professional ---                           K64.8, Other hemorrhoids                           K62.5, Hemorrhage of anus and rectum                           K57.30, Diverticulosis of large intestine without                            perforation or abscess without bleeding CPT copyright 2022 American Medical Association. All rights reserved. The codes documented in this report are preliminary and upon coder review may  be revised to meet current compliance requirements. Ronnette Juniper, MD 11/13/2022 9:52:16 AM This report has been signed electronically. Number of Addenda: 0

## 2022-11-13 NOTE — Anesthesia Postprocedure Evaluation (Signed)
Anesthesia Post Note  Patient: [name]  Procedure(s) Performed: COLONOSCOPY WITH PROPOFOL     Patient location during evaluation: Endoscopy Anesthesia Type: MAC Level of consciousness: awake and alert, patient cooperative and oriented Pain management: pain level controlled Vital Signs Assessment: post-procedure vital signs reviewed and stable Respiratory status: spontaneous breathing, nonlabored ventilation and respiratory function stable Cardiovascular status: stable and blood pressure returned to baseline Postop Assessment: no apparent nausea or vomiting and able to ambulate Anesthetic complications: no   No notable events documented.  Last Vitals:  Vitals:   11/13/22 1010 11/13/22 1020  BP: (!) 87/55 108/67  Pulse: 62 62  Resp: (!) 22 18  Temp:    SpO2: 96% 98%    Last Pain:  Vitals:   11/13/22 1005  TempSrc:   PainSc: Asleep                 Shaima Sardinas,E. Lataya Varnell

## 2022-11-14 ENCOUNTER — Other Ambulatory Visit: Payer: Self-pay | Admitting: Urology

## 2022-11-14 DIAGNOSIS — R972 Elevated prostate specific antigen [PSA]: Secondary | ICD-10-CM

## 2022-11-14 NOTE — Telephone Encounter (Signed)
Received a fax from  [facility] regarding an approval for [place] patient assistance from 11/12/2022 to 08/13/2023. Approval letter sent to scan center.  Phone #: [phone] Fax #: [phone]  Called pt to provide update, phone number to [facility] provided as well. Nothing further needed at this time.

## 2022-11-15 ENCOUNTER — Encounter (HOSPITAL_COMMUNITY): Payer: Self-pay | Admitting: Gastroenterology

## 2022-11-21 ENCOUNTER — Other Ambulatory Visit: Payer: Self-pay

## 2022-11-21 DIAGNOSIS — I745 Embolism and thrombosis of iliac artery: Secondary | ICD-10-CM

## 2022-11-21 DIAGNOSIS — I739 Peripheral vascular disease, unspecified: Secondary | ICD-10-CM

## 2022-11-26 DIAGNOSIS — M25511 Pain in right shoulder: Secondary | ICD-10-CM | POA: Diagnosis not present

## 2022-11-26 DIAGNOSIS — M059 Rheumatoid arthritis with rheumatoid factor, unspecified: Secondary | ICD-10-CM | POA: Diagnosis not present

## 2022-11-26 DIAGNOSIS — Z79899 Other long term (current) drug therapy: Secondary | ICD-10-CM | POA: Diagnosis not present

## 2022-11-26 DIAGNOSIS — M79643 Pain in unspecified hand: Secondary | ICD-10-CM | POA: Diagnosis not present

## 2022-11-26 DIAGNOSIS — M25539 Pain in unspecified wrist: Secondary | ICD-10-CM | POA: Diagnosis not present

## 2022-11-26 DIAGNOSIS — M1990 Unspecified osteoarthritis, unspecified site: Secondary | ICD-10-CM | POA: Diagnosis not present

## 2022-11-26 DIAGNOSIS — M25561 Pain in right knee: Secondary | ICD-10-CM | POA: Diagnosis not present

## 2022-11-29 ENCOUNTER — Ambulatory Visit (INDEPENDENT_AMBULATORY_CARE_PROVIDER_SITE_OTHER): Payer: Medicare Other | Admitting: Physician Assistant

## 2022-11-29 ENCOUNTER — Ambulatory Visit (INDEPENDENT_AMBULATORY_CARE_PROVIDER_SITE_OTHER)
Admission: RE | Admit: 2022-11-29 | Discharge: 2022-11-29 | Disposition: A | Discharge status: Home / Self Care | Payer: Medicare Other | Source: Ambulatory Visit | Attending: Vascular Surgery | Admitting: Vascular Surgery

## 2022-11-29 ENCOUNTER — Ambulatory Visit (HOSPITAL_COMMUNITY)
Admission: RE | Admit: 2022-11-29 | Discharge: 2022-11-29 | Disposition: A | Discharge status: Home / Self Care | Payer: Medicare Other | Source: Ambulatory Visit | Attending: Vascular Surgery | Admitting: Vascular Surgery

## 2022-11-29 VITALS — BP 130/75 | HR 69 | Temp 97.7°F | Resp 20 | Ht 72.0 in | Wt 174.0 lb

## 2022-11-29 DIAGNOSIS — I739 Peripheral vascular disease, unspecified: Secondary | ICD-10-CM | POA: Insufficient documentation

## 2022-11-29 DIAGNOSIS — I745 Embolism and thrombosis of iliac artery: Secondary | ICD-10-CM

## 2022-11-29 LAB — VAS US ABI WITH/WO TBI
Left ABI: 1.2
Right ABI: 1.19

## 2022-11-29 NOTE — Progress Notes (Signed)
HISTORY AND PHYSICAL     CC:  follow up. Requesting Provider:  Merri Brunette, MD  HPI: This is a 77 y.o. male who is here today for follow up for PAD.  Pt has hx of ischemia of LLE with occluded left EIA and right to left femoral to femoral bypass with PTFE and oversewing of the left EIA on 01/13/2019 by Dr. Edilia Bo.    Pt was last seen 11/02/2021 and at that time, he was doing well without claudication, rest pain or non healing wounds.  His mobility was limited by his pulmonary status and DOE.  He did have a carotid duplex in November 2023 and this revealed only minimal wall thickening or plaque.   The pt returns today for follow up.  He states he is doing well and denies any claudication, rest pain or non healing wounds.  He is limited in his mobility by arthritis.  He is compliant with his asa/statin.  The pt is on a statin for cholesterol management.    The pt is on an aspirin.    Other AC:  none The pt is not on medication for hypertension.  The pt is not on medication for diabetes. Tobacco hx:  former  Pt does not have family hx of AAA.  Past Medical History:  Diagnosis Date   Allergic rhinitis 07/12/2020   Basal cell carcinoma 02/21/2011   left inner cheek, lateral (CX35FU)   Body mass index (BMI) 30.0-30.9, adult 11/01/2020   Combined forms of age-related cataract of left eye 01/26/2020   Combined forms of age-related cataract of right eye 01/14/2020   Cramp in lower leg associated with rest 11/01/2020   Depression    DOE (dyspnea on exertion) 02/03/2020   Onset summer 2020   -  02/03/2020   Walked RA  3 laps @ approx 227ft each @   fast pace  stopped due to end of study,    sats 94% at end and minimally sob  - Collagen vascular profile 02/03/2020 >>>  Neg       Lab Results  Component  Value  Date     ESRSEDRATE  18  02/03/2020  - Allergy profile 02/03/20 >  Eos 0.4 /  IgE  1344   - HSP serology 02/03/2020  Neg   - HRCT 02/05/2020  compatible with inters   ED (erectile dysfunction)  of organic origin 11/01/2020   Elevated PSA 10/23/2022   Epididymal cyst 11/01/2020   Exacerbation of asthma 11/01/2020   Family history of ischemic heart disease (IHD) 11/01/2020   Family history of malignant neoplasm of gastrointestinal tract 11/01/2020   Femoral hernia of left side 10/23/2022   Gastroesophageal reflux disease without esophagitis 11/01/2020   Generalized anxiety disorder 11/01/2020   GERD (gastroesophageal reflux disease)    Hardening of the aorta (main artery of the heart) 11/01/2020   Hearing loss 11/01/2020   Hematochezia 03/26/2022   History of adenomatous polyp of colon 11/01/2020   History of hematuria 11/01/2020   Hyperlipidemia    ILD (interstitial lung disease) 07/12/2020   Iliac artery occlusion, left 01/13/2019   Incarcerated incisional hernia with SBO 12/28/2020   Incarcerated ventral hernia 12/28/2020   IPF (idiopathic pulmonary fibrosis) 08/25/2021   Long term (current) use of aspirin 11/01/2020   Osteoarthritis of knee 11/01/2020   Osteopenia    Other skin changes due to chronic exposure to nonionizing radiation 11/01/2020   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   Peripheral vascular disease 11/01/2020   Personal history  of (healed) traumatic fracture 11/01/2020   Personal history of other malignant neoplasm of skin 11/01/2020   Polyclonal gammopathy 10/23/2022   Pulmonary hypertension 03/26/2022   Recurrent ventral incisional hernia 12/28/2020   Right knee pain 03/02/2015   SBO (small bowel obstruction) 10/26/2020   Squamous cell carcinoma of skin 07/21/2019   in situ- fornt center scalp (CX35FU)   Squamous cell carcinoma, face 12/13/2018   in situ- left forehead(CX35FU)   Stopped smoking 11/01/2020   Ventral hernia 11/01/2020    Past Surgical History:  Procedure Laterality Date   ABDOMINAL WALL MESH  REMOVAL  05/29/2008   COLONOSCOPY WITH PROPOFOL N/A 06/19/2022   Procedure: COLONOSCOPY WITH PROPOFOL;  Surgeon: Kerin Salen, MD;   Location: [facility];  Service: Gastroenterology;  Laterality: N/A;   COLONOSCOPY WITH PROPOFOL N/A 11/13/2022   Procedure: COLONOSCOPY WITH PROPOFOL;  Surgeon: Kerin Salen, MD;  Location: [facility];  Service: Gastroenterology;  Laterality: N/A;   FEMORAL-FEMORAL BYPASS GRAFT Left 01/13/2019   Procedure: BYPASS GRAFT FEMORAL-FEMORAL ARTERY;  Surgeon: Chuck Hint, MD;  Location: [facility];  Service: Vascular;  Laterality: Left;   INGUINAL HERNIA REPAIR N/A 12/28/2020   Procedure: LAPAROSCOPIC VENTERAL HERNIA REPAIR WITH MESH, LYSIS OF ADHESIONS;  Surgeon: Karie Soda, MD;  Location: [facility];  Service: General;  Laterality: N/A;   LUMBAR LAMINECTOMY     ORCHIECTOMY Left 01/12/2019   Procedure: LEFT INGUINAL RADICAL ORCHIECTOMY;  Surgeon: Crista Elliot, MD;  Location: [facility];  Service: Urology;  Laterality: Left;   POLYPECTOMY  06/19/2022   Procedure: POLYPECTOMY;  Surgeon: Kerin Salen, MD;  Location: [facility];  Service: Gastroenterology;;   ROTATOR CUFF REPAIR Right    SMALL INTESTINE SURGERY  05/29/2008   SBO into mesh - SB resection, mesh removal & primary hernia repair   UMBILICAL HERNIA REPAIR  2009   open with Ventralex mesh patch    No Known Allergies  Current Outpatient Medications  Medication Sig Dispense Refill   aspirin EC 81 MG tablet Take 81 mg by mouth daily. Swallow whole.     Calcium-Phosphorus-Vitamin D (CITRACAL +D3 PO) Take 1 tablet by mouth every evening.     Cholecalciferol 125 MCG (5000 UT) TABS Take 1,000 Units by mouth every evening.     Cyanocobalamin (VITAMIN B12 PO) Take 1,000 mcg by mouth in the morning.     ferrous sulfate 325 (65 FE) MG tablet Take 325 mg by mouth every evening.     folic acid (FOLVITE) 1 MG tablet Take 1 mg by mouth every evening.     loratadine (CLARITIN) 10 MG tablet Take 1 tablet (10 mg total) by mouth daily.     methotrexate (RHEUMATREX) 2.5 MG tablet Take 10 mg by mouth every Wednesday.     Nintedanib  (OFEV) 100 MG CAPS Take 1 capsule (100 mg total) by mouth 2 (two) times daily. 180 capsule 0   omeprazole (PRILOSEC) 10 MG capsule Take 10 mg by mouth daily as needed (indigestion/heartburn.).     OVER THE COUNTER MEDICATION Place 4 patches onto the skin daily. LifeWave X-39 Patches  [employer]  Light Therapy (stimulate cell growth)     rosuvastatin (CRESTOR) 10 MG tablet Take 10 mg by mouth in the morning.     sertraline (ZOLOFT) 100 MG tablet Take 100 mg by mouth every evening.     No current facility-administered medications for this visit.    Family History  Problem Relation Age of Onset  Other Neg Hx        No FHx of small bowel obstruction    Social History   Socioeconomic History   Marital status: Widowed    Spouse name: Arlie Riker   Number of children: 2   Years of education: 12th grade   Highest education level: Not on file  Occupational History   Occupation: grading  Tobacco Use   Smoking status: Former    Packs/day: 1.00    Years: 28.00    Additional pack years: 0.00    Total pack years: 28.00    Types: Cigarettes   Smokeless tobacco: Never   Tobacco comments:    quit 1992  Vaping Use   Vaping Use: Never used  Substance and Sexual Activity   Alcohol use: Yes    Alcohol/week: 6.0 standard drinks of alcohol    Types: 6 Cans of beer per week    Comment: beer occasional   Drug use: Never   Sexual activity: Not Currently  Other Topics Concern   Not on file  Social History Narrative   Not on file   Social Determinants of Health   Financial Resource Strain: Not on file  Food Insecurity: Not on file  Transportation Needs: Not on file  Physical Activity: Not on file  Stress: Not on file  Social Connections: Not on file  Intimate Partner Violence: Not on file     REVIEW OF SYSTEMS:   [X]  denotes positive finding, [ ]  denotes negative finding Cardiac  Comments:  Chest pain or chest pressure:    Shortness of breath upon  exertion:    Short of breath when lying flat:    Irregular heart rhythm:        Vascular    Pain in calf, thigh, or hip brought on by ambulation:    Pain in feet at night that wakes you up from your sleep:     Blood clot in your veins:    Leg swelling:         Pulmonary    Oxygen at home:    Productive cough:     Wheezing:         Neurologic    Sudden weakness in arms or legs:     Sudden numbness in arms or legs:     Sudden onset of difficulty speaking or slurred speech:    Temporary loss of vision in one eye:     Problems with dizziness:         Gastrointestinal    Blood in stool:     Vomited blood:         Genitourinary    Burning when urinating:     Blood in urine:        Psychiatric    Major depression:         Hematologic    Bleeding problems:    Problems with blood clotting too easily:        Skin    Rashes or ulcers:        Constitutional    Fever or chills:      PHYSICAL EXAMINATION:  Today's Vitals   11/29/22 1350  BP: 130/75  Pulse: 69  Resp: 20  Temp: 97.7 F (36.5 C)  SpO2: 95%  Weight: 174 lb (78.9 kg)  Height: 6' (1.829 m)   Body mass index is 23.6 kg/m.   General:  WDWN in NAD; vital signs documented above Gait: Not observed HENT: WNL, normocephalic Pulmonary: normal non-labored  breathing , without wheezing Cardiac: regular HR, without carotid bruits Abdomen: soft, NT; aortic pulse is not palpable Skin: without rashes Vascular Exam/Pulses:  Right Left  Radial 2+ (normal) 2+ (normal)  Femoral 2+ (normal) 2+ (normal)  DP 2+ (normal) 2+ (normal)  PT 2+ (normal) 2+ (normal)   Extremities: without ischemic changes, without Gangrene , without cellulitis; without open wounds; fem fem bypass graft pulse is easily palpable Musculoskeletal: no muscle wasting or atrophy  Neurologic: A&O X 3 Psychiatric:  The pt has Normal affect.   Non-Invasive Vascular Imaging:   ABI's/TBI's on 11/29/2022: Right:  1.19/1.10 - Great toe pressure:  138 Left:  1.20/1.17 - Great toe pressure: 147  Arterial duplex on 11/29/2022: Fem Fem Graft: Right to Left  +------------------+--------+--------+---------+--------+                   PSV cm/sStenosisWaveform Comments  +------------------+--------+--------+---------+--------+  Inflow           64              triphasicEIA       +------------------+--------+--------+---------+--------+  Prox anastomosis  100             triphasic          +------------------+--------+--------+---------+--------+  Proximal graft    126             triphasic          +------------------+--------+--------+---------+--------+  Mid graft         133             triphasic          +------------------+--------+--------+---------+--------+  Distal graft      136             triphasic          +------------------+--------+--------+---------+--------+  Distal anastomosis189             triphasic          +------------------+--------+--------+---------+--------+  Outflow          79              biphasic CFA       +------------------+--------+--------+---------+--------+     +--------+--------+-----+--------+----------+--------+  LEFT   PSV cm/sRatioStenosisWaveform  Comments  +--------+--------+-----+--------+----------+--------+  DFA    143                  monophasic          +--------+--------+-----+--------+----------+--------+  SFA Prox104                  triphasic           +--------+--------+-----+--------+----------+--------+     Summary:  Right: Patent right to left fem-fem graft with n stenosis.   Previous ABI's/TBI's on 11/02/2021: Right:  1.17/1.16 - Great toe pressure: 167 Left:  1.24/1.14 - Great toe pressure:  164  Previous arterial duplex on 11/02/2021: Widely patent right to left femorofemoral bypass without evidence of  stenosis.     ASSESSMENT/PLAN:: 77 y.o. male here for follow up for PAD with hx of  ischemia of  LLE with occluded left EIA and right to left femoral to femoral bypass with PTFE and oversewing of the left EIA on 01/13/2019 by Dr. Edilia Bo.     -pt doing well with easily palpable pedal pulses bilaterally.  His graft pulse is also easily palpable. -continue asa/statin -encouraged him to continue walking as much as tolerated. -pt will f/u in one year with ABI and fem fem  duplex.  He knows to call sooner if he has any issues before then.     Doreatha Massed, [facility] [phone]  Clinic MD:   Lenell Antu on call MD

## 2022-12-14 ENCOUNTER — Ambulatory Visit
Admission: RE | Admit: 2022-12-14 | Discharge: 2022-12-14 | Disposition: A | Discharge status: Home / Self Care | Payer: Medicare Other | Source: Ambulatory Visit | Attending: Urology | Admitting: Urology

## 2022-12-14 DIAGNOSIS — R972 Elevated prostate specific antigen [PSA]: Secondary | ICD-10-CM | POA: Diagnosis not present

## 2022-12-14 MED ORDER — GADOPICLENOL 0.5 MMOL/ML IV SOLN
8.0000 mL | Freq: Once | INTRAVENOUS | Status: AC | PRN
  Administered 2022-12-14: 8 mL via INTRAVENOUS

## 2022-12-27 DIAGNOSIS — D400 Neoplasm of uncertain behavior of prostate: Secondary | ICD-10-CM | POA: Diagnosis not present

## 2022-12-27 DIAGNOSIS — R972 Elevated prostate specific antigen [PSA]: Secondary | ICD-10-CM | POA: Diagnosis not present

## 2022-12-27 DIAGNOSIS — N400 Enlarged prostate without lower urinary tract symptoms: Secondary | ICD-10-CM | POA: Diagnosis not present

## 2023-01-09 ENCOUNTER — Encounter: Payer: Self-pay | Admitting: Pulmonary Disease

## 2023-01-09 ENCOUNTER — Ambulatory Visit (INDEPENDENT_AMBULATORY_CARE_PROVIDER_SITE_OTHER): Payer: Medicare Other | Admitting: Pulmonary Disease

## 2023-01-09 VITALS — BP 118/64 | HR 66 | Temp 98.2°F | Ht 72.0 in | Wt 173.4 lb

## 2023-01-09 DIAGNOSIS — J84112 Idiopathic pulmonary fibrosis: Secondary | ICD-10-CM | POA: Diagnosis not present

## 2023-01-09 DIAGNOSIS — R0602 Shortness of breath: Secondary | ICD-10-CM

## 2023-01-09 DIAGNOSIS — Z5181 Encounter for therapeutic drug level monitoring: Secondary | ICD-10-CM | POA: Diagnosis not present

## 2023-01-09 LAB — COMPREHENSIVE METABOLIC PANEL
ALT: 15 U/L (ref 0–53)
AST: 24 U/L (ref 0–37)
Albumin: 3.5 g/dL (ref 3.5–5.2)
Alkaline Phosphatase: 94 U/L (ref 39–117)
BUN: 11 mg/dL (ref 6–23)
CO2: 31 mEq/L (ref 19–32)
Calcium: 9.8 mg/dL (ref 8.4–10.5)
Chloride: 99 mEq/L (ref 96–112)
Creatinine, Ser: 0.75 mg/dL (ref 0.40–1.50)
GFR: 86.36 mL/min (ref >60.00)
Glucose, Bld: 88 mg/dL (ref 70–99)
Potassium: 4.7 mEq/L (ref 3.5–5.1)
Sodium: 136 mEq/L (ref 135–145)
Total Bilirubin: 0.5 mg/dL (ref 0.2–1.2)
Total Protein: 7.1 g/dL (ref 6.0–8.3)

## 2023-01-09 LAB — CBC WITH DIFFERENTIAL/PLATELET
Basophils Absolute: 0.1 10*3/uL (ref 0.0–0.1)
Basophils Relative: 0.7 % (ref 0.0–3.0)
Eosinophils Absolute: 0.3 10*3/uL (ref 0.0–0.7)
Eosinophils Relative: 2.9 % (ref 0.0–5.0)
HCT: 35.4 % — ABNORMAL LOW (ref 39.0–52.0)
Hemoglobin: 11.7 g/dL — ABNORMAL LOW (ref 13.0–17.0)
Lymphocytes Relative: 24.8 % (ref 12.0–46.0)
Lymphs Abs: 2.2 10*3/uL (ref 0.7–4.0)
MCHC: 33 g/dL (ref 30.0–36.0)
MCV: 100.9 fl — ABNORMAL HIGH (ref 78.0–100.0)
Monocytes Absolute: 0.9 10*3/uL (ref 0.1–1.0)
Monocytes Relative: 10.3 % (ref 3.0–12.0)
Neutro Abs: 5.4 10*3/uL (ref 1.4–7.7)
Neutrophils Relative %: 61.3 % (ref 43.0–77.0)
Platelets: 328 10*3/uL (ref 150.0–400.0)
RBC: 3.51 Mil/uL — ABNORMAL LOW (ref 4.22–5.81)
RDW: 14.4 % (ref 11.5–15.5)
WBC: 8.8 10*3/uL (ref 4.0–10.5)

## 2023-01-09 NOTE — Patient Instructions (Signed)
I am glad your breathing is stable Continue Ofev Will check comprehensive metabolic panel and CBC today.  Check proBNP for dyspnea Schedule high-res CT and PFTs in 6 months Return to clinic after these tests

## 2023-01-09 NOTE — Progress Notes (Addendum)
BUEFORD DORFMAN    [id number]    1946/08/13  Primary Care Physician:Pharr, Zollie Beckers, MD  Referring Physician: Merri Brunette, MD [address] [place],  [place] [address]  Chief complaint:  Follow-up for interstitial lung disease, IPF Ofev started November 2022  HPI: 77 y.o.  former smoker referred to pulmonary for evaluation of interstitial lung disease States that he has mild dyspnea on exertion.  No cough. Referred for evaluation of progressive probable UIP pulmonary fibrosis.  Started Durel Salts in November 2022  Received clinic note from Dr. Kathi Ludwig MD, rheumatology dated 02/12/2022 He has been referred by Dr. Renne Crigler for evaluation of inflammatory arthritis, shoulder pain and wrist pain x-rays showed osteoarthritis with inflammation of bilateral wrists.  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  He has been given local steroid injection and possibly needs oral steroids CCP, CRP, sed rate and rheumatoid factor and uric acid ordered.  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 Exposures: Exposed to dust.  No asbestos exposure.  No mold, hot tub, Jacuzzi.  No down pillows or comforters Smoking history: 20-pack-year smoker.  Quit in the 1990s Travel history: No significant travel history Relevant family history: No significant family history of lung disease  Interim history: Continues on Ofev.   He started having diarrhea with Ofev and stopped taking it as he noted blood in the stools.  Since been evaluated by GI with findings of hemorrhoids.  Ofev dose reduced to 100 mg twice daily in December.  He is tolerating the reduced dose better  He has followed up with Dr. Kathi Ludwig, rheumatology and has been started on methotrexate.  He tells me that he now has a diagnosis of rheumatoid arthritis.  Outpatient Encounter Medications as of 01/09/2023  Medication Sig   aspirin EC 81 MG tablet Take 81 mg by mouth daily. Swallow whole.    Calcium-Phosphorus-Vitamin D (CITRACAL +D3 PO) Take 1 tablet by mouth every evening.   Cholecalciferol 125 MCG (5000 UT) TABS Take 1,000 Units by mouth every evening.   Cyanocobalamin (VITAMIN B12 PO) Take 1,000 mcg by mouth in the morning.   ferrous sulfate 325 (65 FE) MG tablet Take 325 mg by mouth every evening.   folic acid (FOLVITE) 1 MG tablet Take 1 mg by mouth every evening.   loratadine (CLARITIN) 10 MG tablet Take 1 tablet (10 mg total) by mouth daily.   methotrexate (RHEUMATREX) 2.5 MG tablet Take 10 mg by mouth every Wednesday.   Nintedanib (OFEV) 100 MG CAPS Take 1 capsule (100 mg total) by mouth 2 (two) times daily.   omeprazole (PRILOSEC) 10 MG capsule Take 10 mg by mouth daily as needed (indigestion/heartburn.).   OVER THE COUNTER MEDICATION Place 4 patches onto the skin daily. LifeWave X-39 Patches  [employer]  Light Therapy (stimulate cell growth)   rosuvastatin (CRESTOR) 10 MG tablet Take 10 mg by mouth in the morning.   sertraline (ZOLOFT) 100 MG tablet Take 100 mg by mouth every evening.   No facility-administered encounter medications on file as of 01/09/2023.    Physical Exam: Blood pressure 114/64, pulse 95, temperature 98.1 F (36.7 C), temperature source Oral, height 5\' 11"  (1.803 m), weight 186 lb (84.4 kg), SpO2 95 %. Gen:      No acute distress HEENT:  EOMI, sclera anicteric Neck:     No masses; no thyromegaly Lungs:    Clear to auscultation bilaterally; normal  respiratory effort CV:         Regular rate and rhythm; no murmurs Abd:      + bowel sounds; soft, non-tender; no palpable masses, no distension Ext:    No edema; adequate peripheral perfusion Skin:      Warm and dry; no rash Neuro: alert and oriented x 3 Psych: normal mood and affect   Data Reviewed: Imaging: High-res CT 02/05/2020-groundglass attenuation with reticulation and traction bronchiectasis with basal gradient.  2 to 4 mm pulmonary nodules.  Probable UIP  pattern.  High-resolution CT 02/06/2021-stable pattern of pulmonary fibrosis and probable UIP pattern.  Stable pulm nodules.  High-res CT 02/20/2022-slight interval progression with honeycombing.  Probable UIP. I have reviewed the images personally.  PFTs: 04/11/2020 FVC 3.10 [69%], FEV1 2.56 [78%], F/F 83, TLC 4.75 [64%], DLCO 21.55 [81%] Moderate restriction  04/21/2021 FVC 2.68 [60%], FEV1 2.29 [71%], F/F 85, TLC 4.36 [59%], DLCO 17.29 [66%] Moderate restriction, severe diffusion defect.  Worsening lung volumes and diffusion defect  07/20/2022 FVC 2.35 [53%], FEV1 1.98 [62%], F/F84, TLC 3.76 [51%], DLCO 14.84 [57%] Severe restriction, moderate diffusion defect  Labs: IgE 02/02/2020-1344 Hypersensitivity panel 02/03/2020-negative ANA, CCP, rheumatoid factor 02/03/2020-negative  CTD serologies 04/11/2020-negative  Cardiology: Echocardiogram  03/13/2022-LVEF 60 to 65%, grade 1 diastolic dysfunction, reduced TAPSE 07/27/2022-LVEF 60 to 65%, grade 1 diastolic dysfunction, mildly elevated PA systolic pressure 10/17/2022-LVEF 65 to 70%, mild LVH, normal PA systolic pressure, normal RV size and function.  Assessment:  Pulmonary fibrosis CT reviewed with probable UIP pattern No known exposures or CTD symptoms CTD serologies and exposure history is negative  Given progression on PFTs he is a candidate for treatment.  Discussed options in detail including antifibrotic's and we have decided to proceed with Ofev which was started in November 2022.  Dose reduced to 100 mg twice daily in December 2023 due to diarrhea Check hepatic panel and CBC  We discussed pulmonary rehab but he prefers to stay active with walking at home.  Rheumatoid artery S He tells me that he has a new diagnosis of rheumatoid arthritis and is on methotrexate.  The pulmonary fibrosis may be due to RA associated ILD I will get records from Dr. Kathi Ludwig to review  Concern for pulmonary hypertension Echo reviewed with reduced  TAPSE in 2023.  I discussed with Dr. Bing Matter, cardiology.   Follow-up echocardiogram with no evidence of pulmonary hypertension.  Continue monitoring.  Check proBNP.  Plan/Recommendations: Continue Ofev at 100 mg twice daily Imodium as needed Monitor hepatic panel, CBC, proBNP  Chilton Greathouse MD [facility] 01/09/2023, 1:12 PM  CC: Merri Brunette, MD  Addendum: Received note dated 10/23/2022 from [facility], Dr. Renne Crigler Patient has a diagnosis of seropositive rheumatoid arthritis and is currently on methotrexate 10 mg weekly, folic acid and taking prednisone 10 mg/day

## 2023-01-10 LAB — PRO B NATRIURETIC PEPTIDE: NT-Pro BNP: 532 pg/mL — ABNORMAL HIGH (ref 0–486)

## 2023-01-16 ENCOUNTER — Telehealth: Payer: Self-pay | Admitting: Pulmonary Disease

## 2023-01-16 NOTE — Telephone Encounter (Signed)
Patient would like the nurse to call to give him the results of his blood test.  Please advise and call patient to discuss.  CB# [phone]

## 2023-01-16 NOTE — Telephone Encounter (Signed)
Labs are stable.  Continue current therapy.

## 2023-01-16 NOTE — Telephone Encounter (Signed)
Dr. Isaiah Serge can you please advise on patients lab work?

## 2023-01-16 NOTE — Telephone Encounter (Signed)
Went over labs with patient. He verbalized understanding. NFN

## 2023-02-18 DIAGNOSIS — M059 Rheumatoid arthritis with rheumatoid factor, unspecified: Secondary | ICD-10-CM | POA: Diagnosis not present

## 2023-02-18 DIAGNOSIS — M25532 Pain in left wrist: Secondary | ICD-10-CM | POA: Diagnosis not present

## 2023-02-18 DIAGNOSIS — M25561 Pain in right knee: Secondary | ICD-10-CM | POA: Diagnosis not present

## 2023-02-18 DIAGNOSIS — Z79899 Other long term (current) drug therapy: Secondary | ICD-10-CM | POA: Diagnosis not present

## 2023-02-18 DIAGNOSIS — M1990 Unspecified osteoarthritis, unspecified site: Secondary | ICD-10-CM | POA: Diagnosis not present

## 2023-02-18 DIAGNOSIS — M25531 Pain in right wrist: Secondary | ICD-10-CM | POA: Diagnosis not present

## 2023-02-18 DIAGNOSIS — M25511 Pain in right shoulder: Secondary | ICD-10-CM | POA: Diagnosis not present

## 2023-02-18 DIAGNOSIS — M79643 Pain in unspecified hand: Secondary | ICD-10-CM | POA: Diagnosis not present

## 2023-03-19 DIAGNOSIS — R972 Elevated prostate specific antigen [PSA]: Secondary | ICD-10-CM | POA: Diagnosis not present

## 2023-03-26 DIAGNOSIS — R972 Elevated prostate specific antigen [PSA]: Secondary | ICD-10-CM | POA: Diagnosis not present

## 2023-03-26 DIAGNOSIS — R8279 Other abnormal findings on microbiological examination of urine: Secondary | ICD-10-CM | POA: Diagnosis not present

## 2023-04-09 DIAGNOSIS — R8279 Other abnormal findings on microbiological examination of urine: Secondary | ICD-10-CM | POA: Diagnosis not present

## 2023-04-12 DIAGNOSIS — R3121 Asymptomatic microscopic hematuria: Secondary | ICD-10-CM | POA: Diagnosis not present

## 2023-04-23 DIAGNOSIS — D400 Neoplasm of uncertain behavior of prostate: Secondary | ICD-10-CM | POA: Diagnosis not present

## 2023-04-23 DIAGNOSIS — R3121 Asymptomatic microscopic hematuria: Secondary | ICD-10-CM | POA: Diagnosis not present

## 2023-04-23 DIAGNOSIS — R972 Elevated prostate specific antigen [PSA]: Secondary | ICD-10-CM | POA: Diagnosis not present

## 2023-06-12 ENCOUNTER — Telehealth: Payer: Self-pay | Admitting: Pulmonary Disease

## 2023-06-12 DIAGNOSIS — J849 Interstitial pulmonary disease, unspecified: Secondary | ICD-10-CM

## 2023-06-12 MED ORDER — OFEV 100 MG PO CAPS
100.0000 mg | ORAL_CAPSULE | Freq: Two times a day (BID) | ORAL | 0 refills | Status: AC
Start: 2023-06-12 — End: ?

## 2023-06-12 NOTE — Telephone Encounter (Signed)
Rx for Ofev sent to [facility]. Patient requested text with new phone number for Kim to follow-up. Advised of long wait times for Ofev.  Also reviewed that [employer] should be mailing renewal applications for 2025 and he can complete and return to clinic with income documents  Phone: [phone]  Chesley Mires, PharmD, MPH, BCPS, CPP Clinical Pharmacist (Rheumatology and Pulmonology)

## 2023-06-19 ENCOUNTER — Ambulatory Visit (HOSPITAL_BASED_OUTPATIENT_CLINIC_OR_DEPARTMENT_OTHER)
Admission: RE | Admit: 2023-06-19 | Discharge: 2023-06-19 | Disposition: A | Discharge status: Home / Self Care | Payer: Medicare Other | Source: Ambulatory Visit | Attending: Pulmonary Disease | Admitting: Pulmonary Disease

## 2023-06-19 DIAGNOSIS — J84112 Idiopathic pulmonary fibrosis: Secondary | ICD-10-CM | POA: Insufficient documentation

## 2023-06-19 DIAGNOSIS — R918 Other nonspecific abnormal finding of lung field: Secondary | ICD-10-CM | POA: Diagnosis not present

## 2023-06-19 DIAGNOSIS — R590 Localized enlarged lymph nodes: Secondary | ICD-10-CM | POA: Diagnosis not present

## 2023-06-19 DIAGNOSIS — J439 Emphysema, unspecified: Secondary | ICD-10-CM | POA: Diagnosis not present

## 2023-06-24 DIAGNOSIS — L814 Other melanin hyperpigmentation: Secondary | ICD-10-CM | POA: Diagnosis not present

## 2023-06-24 DIAGNOSIS — L578 Other skin changes due to chronic exposure to nonionizing radiation: Secondary | ICD-10-CM | POA: Diagnosis not present

## 2023-06-24 DIAGNOSIS — L821 Other seborrheic keratosis: Secondary | ICD-10-CM | POA: Diagnosis not present

## 2023-06-24 DIAGNOSIS — L570 Actinic keratosis: Secondary | ICD-10-CM | POA: Diagnosis not present

## 2023-07-03 ENCOUNTER — Ambulatory Visit: Payer: Medicare Other | Admitting: Pulmonary Disease

## 2023-07-15 ENCOUNTER — Ambulatory Visit: Payer: Medicare Other | Admitting: Pulmonary Disease

## 2023-07-26 DIAGNOSIS — Z79899 Other long term (current) drug therapy: Secondary | ICD-10-CM | POA: Diagnosis not present

## 2023-09-13 ENCOUNTER — Ambulatory Visit: Payer: Medicare Other | Admitting: Pulmonary Disease

## 2023-09-13 DIAGNOSIS — J84112 Idiopathic pulmonary fibrosis: Secondary | ICD-10-CM

## 2023-09-13 LAB — PULMONARY FUNCTION TEST
DL/VA % pred: 88 %
DL/VA: 3.42 ml/min/mmHg/L
DLCO cor % pred: 49 %
DLCO cor: 12.75 ml/min/mmHg
DLCO unc % pred: 49 %
DLCO unc: 12.75 ml/min/mmHg
FEF 25-75 Post: 1.98 L/s
FEF 25-75 Pre: 1.9 L/s
FEF2575-%Change-Post: 4 %
FEF2575-%Pred-Post: 91 %
FEF2575-%Pred-Pre: 87 %
FEV1-%Change-Post: 0 %
FEV1-%Pred-Post: 64 %
FEV1-%Pred-Pre: 64 %
FEV1-Post: 2 L
FEV1-Pre: 2.01 L
FEV1FVC-%Change-Post: 5 %
FEV1FVC-%Pred-Pre: 111 %
FEV6-%Change-Post: -5 %
FEV6-%Pred-Post: 58 %
FEV6-%Pred-Pre: 61 %
FEV6-Post: 2.38 L
FEV6-Pre: 2.51 L
FEV6FVC-%Change-Post: 0 %
FEV6FVC-%Pred-Post: 106 %
FEV6FVC-%Pred-Pre: 106 %
FVC-%Change-Post: -5 %
FVC-%Pred-Post: 54 %
FVC-%Pred-Pre: 58 %
FVC-Post: 2.38 L
FVC-Pre: 2.51 L
Post FEV1/FVC ratio: 84 %
Post FEV6/FVC ratio: 100 %
Pre FEV1/FVC ratio: 80 %
Pre FEV6/FVC Ratio: 100 %
RV % pred: 60 %
RV: 1.64 L
TLC % pred: 58 %
TLC: 4.33 L

## 2023-09-13 NOTE — Progress Notes (Signed)
 Full PFT performed today.

## 2023-09-13 NOTE — Patient Instructions (Signed)
 Full PFT performed today.

## 2023-09-16 ENCOUNTER — Encounter: Payer: Self-pay | Admitting: Pulmonary Disease

## 2023-09-16 ENCOUNTER — Ambulatory Visit: Payer: Medicare Other | Admitting: Pulmonary Disease

## 2023-09-16 VITALS — BP 132/79 | HR 66 | Ht 72.0 in | Wt 174.2 lb

## 2023-09-16 DIAGNOSIS — J849 Interstitial pulmonary disease, unspecified: Secondary | ICD-10-CM

## 2023-09-16 DIAGNOSIS — Z5181 Encounter for therapeutic drug level monitoring: Secondary | ICD-10-CM | POA: Diagnosis not present

## 2023-09-16 DIAGNOSIS — R0602 Shortness of breath: Secondary | ICD-10-CM

## 2023-09-16 DIAGNOSIS — J84112 Idiopathic pulmonary fibrosis: Secondary | ICD-10-CM

## 2023-09-16 LAB — COMPREHENSIVE METABOLIC PANEL
ALT: 14 U/L (ref 0–53)
AST: 26 U/L (ref 0–37)
Albumin: 3.6 g/dL (ref 3.5–5.2)
Alkaline Phosphatase: 94 U/L (ref 39–117)
BUN: 13 mg/dL (ref 6–23)
CO2: 27 meq/L (ref 19–32)
Calcium: 9.6 mg/dL (ref 8.4–10.5)
Chloride: 99 meq/L (ref 96–112)
Creatinine, Ser: 0.84 mg/dL (ref 0.40–1.50)
GFR: 83.06 mL/min (ref >60.00)
Glucose, Bld: 85 mg/dL (ref 70–99)
Potassium: 4.4 meq/L (ref 3.5–5.1)
Sodium: 134 meq/L — ABNORMAL LOW (ref 135–145)
Total Bilirubin: 0.4 mg/dL (ref 0.2–1.2)
Total Protein: 7.4 g/dL (ref 6.0–8.3)

## 2023-09-16 LAB — CBC
HCT: 38 % — ABNORMAL LOW (ref 39.0–52.0)
Hemoglobin: 12.5 g/dL — ABNORMAL LOW (ref 13.0–17.0)
MCHC: 33 g/dL (ref 30.0–36.0)
MCV: 100.5 fL — ABNORMAL HIGH (ref 78.0–100.0)
Platelets: 312 10*3/uL (ref 150.0–400.0)
RBC: 3.78 Mil/uL — ABNORMAL LOW (ref 4.22–5.81)
RDW: 15.7 % — ABNORMAL HIGH (ref 11.5–15.5)
WBC: 7.2 10*3/uL (ref 4.0–10.5)

## 2023-09-16 LAB — BRAIN NATRIURETIC PEPTIDE: Pro B Natriuretic peptide (BNP): 83 pg/mL (ref 0.0–100.0)

## 2023-09-16 NOTE — Patient Instructions (Signed)
VISIT SUMMARY:  You came in today for a follow-up visit regarding your pulmonary fibrosis and rheumatoid arthritis. We discussed your current symptoms, medications, and overall management of your conditions.  YOUR PLAN:  -PULMONARY FIBROSIS: Pulmonary fibrosis is a condition where the lung tissue becomes scarred and stiff, making it difficult to breathe. Your lung function has been stable over the past two years with the current dose of Ofev (100 mg twice daily). We will continue this medication and monitor your condition with a CT scan in six months. Labs will be obtained today.  -RHEUMATOID ARTHRITIS ASSOCIATED INTERSTITIAL LUNG DISEASE: Rheumatoid arthritis can cause inflammation in the lungs, known as interstitial lung disease. Your current treatment with methotrexate (7 pills once a week) is effectively managing this condition without affecting your lung function. No changes to your treatment are needed at this time.  -RHEUMATOID ARTHRITIS: Rheumatoid arthritis is an autoimmune condition that causes joint pain and swelling. You have persistent symptoms, especially in your hands and shoulders, but are under the care of your rheumatologist, Dr. Kathi Ludwig. We will continue with the current management plan without any changes.  INSTRUCTIONS:  Please follow up in six months. A CT scan will be ordered for that time, and labs will be obtained today.

## 2023-09-16 NOTE — Progress Notes (Addendum)
 [name]    [id number]    07-24-1946  Primary Care Physician:Pharr, Zollie Beckers, MD  Referring Physician: Merri Brunette, MD [address] [place],  [place] [address]  Chief complaint:  Follow-up for interstitial lung disease, IPF Ofev started November 2022  HPI: 78 y.o.  former smoker referred to pulmonary for evaluation of interstitial lung disease States that he has mild dyspnea on exertion.  No cough. Referred for evaluation of progressive probable UIP pulmonary fibrosis.  Started Durel Salts in November 2022  Received clinic note from Dr. Kathi Ludwig MD, rheumatology dated 02/12/2022 He has been referred by Dr. Renne Crigler for evaluation of inflammatory arthritis, shoulder pain and wrist pain x-rays showed osteoarthritis with inflammation of bilateral wrists.  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  Received note dated 10/23/2022 from [facility] Patient has a diagnosis of seropositive rheumatoid arthritis and is currently on methotrexate 10 mg weekly, folic acid and taking prednisone 10 mg/day.  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 Exposures: Exposed to dust.  No asbestos exposure.  No mold, hot tub, Jacuzzi.  No down pillows or comforters Smoking history: 20-pack-year smoker.  Quit in the 1990s Travel history: No significant travel history Relevant family history: No significant family history of lung disease  Interim history: Discussed the use of AI scribe software for clinical note transcription with the patient, who gave verbal consent to proceed.  The patient, with pulmonary fibrosis and rheumatoid arthritis, presents for follow-up.  He experiences shortness of breath, which has not significantly worsened over the past year. He is currently taking Ofev at a dose of 100 mg twice a day for pulmonary fibrosis. Previously, he experienced diarrhea at a higher dose, which improved after the dose was  reduced.  He has a history of rheumatoid arthritis and is taking methotrexate, seven pills once a week, for management. He has persistent joint pain, particularly in his hands and shoulders, with variable swelling. He also mentions a sensation of 'popping' in his neck when turning his head. He discontinued prednisone approximately six months ago due to adverse effects, including anxiety, and has not been on prednisone since that time.   Outpatient Encounter Medications as of 09/16/2023  Medication Sig   aspirin EC 81 MG tablet Take 81 mg by mouth daily. Swallow whole.   Calcium-Phosphorus-Vitamin D (CITRACAL +D3 PO) Take 1 tablet by mouth every evening.   Cholecalciferol 125 MCG (5000 UT) TABS Take 1,000 Units by mouth every evening.   Cyanocobalamin (VITAMIN B12 PO) Take 1,000 mcg by mouth in the morning.   ferrous sulfate 325 (65 FE) MG tablet Take 325 mg by mouth every evening.   folic acid (FOLVITE) 1 MG tablet Take 1 mg by mouth every evening.   loratadine (CLARITIN) 10 MG tablet Take 1 tablet (10 mg total) by mouth daily.   methotrexate (RHEUMATREX) 2.5 MG tablet Take 10 mg by mouth every Wednesday.   Nintedanib (OFEV) 100 MG CAPS Take 1 capsule (100 mg total) by mouth 2 (two) times daily.   OVER THE COUNTER MEDICATION Place 4 patches onto the skin daily. LifeWave X-39 Patches  [employer]  Light Therapy (stimulate cell growth)   sertraline (ZOLOFT) 100 MG tablet Take 100 mg by mouth every evening.   omeprazole (PRILOSEC) 10 MG capsule Take 10 mg by mouth daily as needed (indigestion/heartburn.). (Patient not taking: Reported on 09/16/2023)   rosuvastatin (CRESTOR) 10  MG tablet Take 10 mg by mouth in the morning. (Patient not taking: Reported on 09/16/2023)   No facility-administered encounter medications on file as of 09/16/2023.    Physical Exam: Blood pressure 132/79, pulse 66, height 6' (1.829 m), weight 174 lb 3.2 oz (79 kg), SpO2 97%. Gen:      No acute  distress HEENT:  EOMI, sclera anicteric Neck:     No masses; no thyromegaly Lungs:    Clear to auscultation bilaterally; normal respiratory effort CV:         Regular rate and rhythm; no murmurs Abd:      + bowel sounds; soft, non-tender; no palpable masses, no distension Ext:    No edema; adequate peripheral perfusion Skin:      Warm and dry; no rash Neuro: alert and oriented x 3 Psych: normal mood and affect   Data Reviewed: Imaging: High-res CT 02/05/2020-groundglass attenuation with reticulation and traction bronchiectasis with basal gradient.  2 to 4 mm pulmonary nodules.  Probable UIP pattern.  High-resolution CT 02/06/2021-stable pattern of pulmonary fibrosis and probable UIP pattern.  Stable pulm nodules.  High-res CT 02/20/2022-slight interval progression with honeycombing.  Probable UIP.  High-res CT 06/19/2023-mildly progressive fibrosis, UIP pattern. I have reviewed the images personally.  PFTs: 04/11/2020 FVC 3.10 [69%], FEV1 2.56 [78%], F/F 83, TLC 4.75 [64%], DLCO 21.55 [81%] Moderate restriction  04/21/2021 FVC 2.68 [60%], FEV1 2.29 [71%], F/F 85, TLC 4.36 [59%], DLCO 17.29 [66%] Moderate restriction, severe diffusion defect.  Worsening lung volumes and diffusion defect  07/20/2022 FVC 2.35 [53%], FEV1 1.98 [62%], F/F84, TLC 3.76 [51%], DLCO 14.84 [57%] Severe restriction, moderate diffusion defect  09/13/2023 FVC 2.38 [54%), FEV1 2.00 [64%], F/F84, TLC 4.33 [58%], DLCO 12.75 [49%] Moderate restriction, moderate diffusion defect  Labs: IgE 02/02/2020-1344 Hypersensitivity panel 02/03/2020-negative ANA, CCP, rheumatoid factor 02/03/2020-negative  CTD serologies 04/11/2020-negative  Cardiology: Echocardiogram  03/13/2022-LVEF 60 to 65%, grade 1 diastolic dysfunction, reduced TAPSE 07/27/2022-LVEF 60 to 65%, grade 1 diastolic dysfunction, mildly elevated PA systolic pressure 10/17/2022-LVEF 65 to 70%, mild LVH, normal PA systolic pressure, normal RV size and  function.  Assessment:  Pulmonary Fibrosis, RA ILD Currently on treatment with methotrexate under the care of Dr. Kathi Ludwig, rheumatology Given progression on PFTs he is a candidate for treatment.  Discussed options in detail including antifibrotic's and we have decided to proceed with Ofev which was started in November 2022.  Dose reduced to 100 mg twice daily in December 2023 due to diarrhea  Currently on Ofev 100 mg twice daily, which effectively manages the condition with reduced diarrhea at the lower dose. Lung function tests show overall stability with minor variations likely due to test conditions and effort.  - Order CT scan in six months - Obtain labs today - Follow-up in six months  Rheumatoid Arthritis Rheumatoid arthritis with persistent joint pain and swelling, particularly in the hands and shoulders. Symptoms vary daily. Under the care of rheumatologist, Dr. Kathi Ludwig. No immediate changes to current management plan. - Continue current management with rheumatologist  Concern for pulmonary hypertension Echo reviewed with reduced TAPSE in 2023.  I discussed with Dr. Bing Matter, cardiology.   Follow-up echocardiogram with no evidence of pulmonary hypertension.  Continue monitoring.  Check BNP.  Follow-up - Follow-up in six months - Order CT scan in six months - Obtain labs today.   Plan/Recommendations: Continue Ofev at 100 mg twice daily Imodium as needed Monitor hepatic panel, CBC, BNP  Chilton Greathouse MD [facility] 09/16/2023,  1:15 PM  CC: Merri Brunette, MD  Addendum:

## 2023-09-17 ENCOUNTER — Telehealth: Payer: Self-pay | Admitting: Pulmonary Disease

## 2023-09-17 NOTE — Telephone Encounter (Signed)
Kim daughter states patient needs refill for Ofev. Pharmacy is [facility]. Kim phone number is [phone].

## 2023-09-17 NOTE — Telephone Encounter (Signed)
 We do not have PAP approval for 2025 calendar year for patient's Ofev . ATC patient's daughter, Luke, regarding renewal. Provided my call back number  Spoke with patient instead - he requested application to be left up front for his daughter. He will provide income documents (SS statement) to attach to application. Prescriber form placed in Dr. Jiles mailbox for signature  Patient form placed in file cabinet up front  He is out of Ofev  right now.  Sherry Pennant, PharmD, MPH, BCPS, CPP Clinical Pharmacist (Rheumatology and Pulmonology)

## 2023-09-20 NOTE — Telephone Encounter (Signed)
 Daughter picked up letter and will mail back.

## 2023-10-03 NOTE — Telephone Encounter (Signed)
Daughter states medication Ofev needs to be sent to the pharmacy.   IT can be sent through [facility].

## 2023-10-07 NOTE — Telephone Encounter (Signed)
 Provider form completed and faxed to [facility] with med list.  Phone #: [phone] Fax #: [phone]  Chesley Mires, PharmD, MPH, BCPS, CPP Clinical Pharmacist (Rheumatology and Pulmonology)

## 2023-10-07 NOTE — Telephone Encounter (Signed)
 Patient is out of Ofev and needs it refilled. He has been without for over a month.

## 2023-10-10 NOTE — Telephone Encounter (Signed)
 Received a fax from  [facility] regarding an approval for OFEV patient assistance from 10/09/2023 to 08/12/2024. Approval letter sent to scan center.  Phone #: [phone] Fax #: [phone] Patient ID: [id number]  Called patient to notify. Left VM with [employer] phone number to schedule shipment  Chesley Mires, PharmD, MPH, BCPS, CPP Clinical Pharmacist (Rheumatology and Pulmonology)

## 2023-10-14 DIAGNOSIS — M79643 Pain in unspecified hand: Secondary | ICD-10-CM | POA: Diagnosis not present

## 2023-10-14 DIAGNOSIS — M25561 Pain in right knee: Secondary | ICD-10-CM | POA: Diagnosis not present

## 2023-10-14 DIAGNOSIS — Z79899 Other long term (current) drug therapy: Secondary | ICD-10-CM | POA: Diagnosis not present

## 2023-10-14 DIAGNOSIS — M25439 Effusion, unspecified wrist: Secondary | ICD-10-CM | POA: Diagnosis not present

## 2023-10-14 DIAGNOSIS — M25511 Pain in right shoulder: Secondary | ICD-10-CM | POA: Diagnosis not present

## 2023-10-14 DIAGNOSIS — M059 Rheumatoid arthritis with rheumatoid factor, unspecified: Secondary | ICD-10-CM | POA: Diagnosis not present

## 2023-10-14 DIAGNOSIS — M1990 Unspecified osteoarthritis, unspecified site: Secondary | ICD-10-CM | POA: Diagnosis not present

## 2023-10-15 DIAGNOSIS — R972 Elevated prostate specific antigen [PSA]: Secondary | ICD-10-CM | POA: Diagnosis not present

## 2023-10-22 DIAGNOSIS — D400 Neoplasm of uncertain behavior of prostate: Secondary | ICD-10-CM | POA: Diagnosis not present

## 2023-10-22 DIAGNOSIS — R972 Elevated prostate specific antigen [PSA]: Secondary | ICD-10-CM | POA: Diagnosis not present

## 2023-10-22 DIAGNOSIS — R3121 Asymptomatic microscopic hematuria: Secondary | ICD-10-CM | POA: Diagnosis not present

## 2023-10-24 DIAGNOSIS — E785 Hyperlipidemia, unspecified: Secondary | ICD-10-CM | POA: Diagnosis not present

## 2023-10-24 DIAGNOSIS — Z125 Encounter for screening for malignant neoplasm of prostate: Secondary | ICD-10-CM | POA: Diagnosis not present

## 2023-10-29 DIAGNOSIS — M059 Rheumatoid arthritis with rheumatoid factor, unspecified: Secondary | ICD-10-CM | POA: Diagnosis not present

## 2023-10-29 DIAGNOSIS — J309 Allergic rhinitis, unspecified: Secondary | ICD-10-CM | POA: Diagnosis not present

## 2023-10-29 DIAGNOSIS — D539 Nutritional anemia, unspecified: Secondary | ICD-10-CM | POA: Diagnosis not present

## 2023-10-29 DIAGNOSIS — J849 Interstitial pulmonary disease, unspecified: Secondary | ICD-10-CM | POA: Diagnosis not present

## 2023-10-29 DIAGNOSIS — J84112 Idiopathic pulmonary fibrosis: Secondary | ICD-10-CM | POA: Diagnosis not present

## 2023-10-29 DIAGNOSIS — M8580 Other specified disorders of bone density and structure, unspecified site: Secondary | ICD-10-CM | POA: Diagnosis not present

## 2023-10-29 DIAGNOSIS — R748 Abnormal levels of other serum enzymes: Secondary | ICD-10-CM | POA: Diagnosis not present

## 2023-10-29 DIAGNOSIS — F411 Generalized anxiety disorder: Secondary | ICD-10-CM | POA: Diagnosis not present

## 2023-10-29 DIAGNOSIS — K219 Gastro-esophageal reflux disease without esophagitis: Secondary | ICD-10-CM | POA: Diagnosis not present

## 2023-10-29 DIAGNOSIS — I700 Atherosclerosis of aorta: Secondary | ICD-10-CM | POA: Diagnosis not present

## 2023-10-29 DIAGNOSIS — Z0000 Encounter for general adult medical examination without abnormal findings: Secondary | ICD-10-CM | POA: Diagnosis not present

## 2023-10-30 ENCOUNTER — Telehealth: Payer: Self-pay | Admitting: Pulmonary Disease

## 2023-10-30 NOTE — Telephone Encounter (Signed)
 PT wanting to know if he has had an RSV shot administered by our practice. He was having his physical yesterday and his PCP was asking. His # is [phone]

## 2023-10-30 NOTE — Telephone Encounter (Signed)
 Spoke to patient. He is aware that he has not ha RSV vaccine from our office.  He voiced his understanding.  Nothing further needed.

## 2023-11-18 DIAGNOSIS — D539 Nutritional anemia, unspecified: Secondary | ICD-10-CM | POA: Insufficient documentation

## 2023-11-19 ENCOUNTER — Ambulatory Visit: Attending: Cardiology | Admitting: Cardiology

## 2023-11-19 VITALS — BP 130/74 | HR 54 | Ht 72.0 in | Wt 182.0 lb

## 2023-11-19 DIAGNOSIS — Z87891 Personal history of nicotine dependence: Secondary | ICD-10-CM | POA: Diagnosis not present

## 2023-11-19 DIAGNOSIS — E785 Hyperlipidemia, unspecified: Secondary | ICD-10-CM

## 2023-11-19 DIAGNOSIS — J849 Interstitial pulmonary disease, unspecified: Secondary | ICD-10-CM | POA: Diagnosis not present

## 2023-11-19 DIAGNOSIS — I739 Peripheral vascular disease, unspecified: Secondary | ICD-10-CM

## 2023-11-19 DIAGNOSIS — R0609 Other forms of dyspnea: Secondary | ICD-10-CM

## 2023-11-19 MED ORDER — ATORVASTATIN CALCIUM 10 MG PO TABS: 10.0000 mg | ORAL_TABLET | Freq: Every day | ORAL | 3 refills | Status: AC

## 2023-11-19 NOTE — Patient Instructions (Signed)
 Medication Instructions:   START: Lipitor 10mg  1 tablet daily   Lab Work: 3rd Floor   [address]  Your physician recommends that you return for lab work in:    You need to have labs done when you are fasting.  You can come Monday through Friday 8:00 am to 11:30AM and 1:00 to 4:00. You do not need to make an appointment as the order has already been placed.   Testing/Procedures: None Ordered   Follow-Up: At [facility], you and your health needs are our priority.  As part of our continuing mission to provide you with exceptional heart care, we have created designated Provider Care Teams.  These Care Teams include your primary Cardiologist (physician) and Advanced Practice Providers (APPs -  Physician Assistants and Nurse Practitioners) who all work together to provide you with the care you need, when you need it.  We recommend signing up for the patient portal called "MyChart".  Sign up information is provided on this After Visit Summary.  MyChart is used to connect with patients for Virtual Visits (Telemedicine).  Patients are able to view lab/test results, encounter notes, upcoming appointments, etc.  Non-urgent messages can be sent to your provider as well.   To learn more about what you can do with MyChart, go to [email or web address].    Your next appointment:   6 month(s)  The format for your next appointment:   In Person  Provider:   Gypsy Balsam, MD    Other Instructions NA

## 2023-11-19 NOTE — Progress Notes (Signed)
 Cardiology Office Note:    Date:  11/19/2023   ID:  [name], DOB 04/01/1946, MRN [id number]  PCP:  Merri Brunette, MD  Cardiologist:  Gypsy Balsam, MD    Referring MD: Merri Brunette, MD   Chief Complaint  Patient presents with   Follow-up    History of Present Illness:    [name] is a 78 y.o. male past medical history significant for essential hypertension, dyslipidemia, remote history of smoking who quit he quit smoking in 1995, peripheral vascular disease, status post [place] bypass graft done many years ago he does have history of pulmonary fibrosis, he was sent to [place] because of enlargement of pulmonary artery after the repeated echocardiogram did not show any evidence of pulmonary hypertension.  Comes today to months for follow-up overall doing well I tried to put him on statin however he started having some diarrhea so he stopped it.  He denies have any chest pain tightness squeezing pressure burning chest, complains weakness in the leg but no pain  Past Medical History:  Diagnosis Date   Allergic rhinitis 07/12/2020   Basal cell carcinoma 02/21/2011   left inner cheek, lateral (CX35FU)   Body mass index (BMI) 30.0-30.9, adult 11/01/2020   Combined forms of age-related cataract of left eye 01/26/2020   Combined forms of age-related cataract of right eye 01/14/2020   Cramp in lower leg associated with rest 11/01/2020   Depression    DOE (dyspnea on exertion) 02/03/2020   Onset summer 2020   -  02/03/2020   Walked RA  3 laps @ approx 286ft each @   fast pace  stopped due to end of study,    sats 94% at end and minimally sob  - Collagen vascular profile 02/03/2020 >>>  Neg       Lab Results  Component  Value  Date     ESRSEDRATE  18  02/03/2020  - Allergy profile 02/03/20 >  Eos 0.4 /  IgE  1344   - HSP serology 02/03/2020  Neg   - HRCT 02/05/2020  compatible with inters   ED (erectile dysfunction) of organic origin 11/01/2020   Elevated PSA 10/23/2022    Epididymal cyst 11/01/2020   Exacerbation of asthma 11/01/2020   Family history of ischemic heart disease (IHD) 11/01/2020   Family history of malignant neoplasm of gastrointestinal tract 11/01/2020   Femoral hernia of left side 10/23/2022   Gastroesophageal reflux disease without esophagitis 11/01/2020   Generalized anxiety disorder 11/01/2020   GERD (gastroesophageal reflux disease)    Hardening of the aorta (main artery of the heart) (HCC) 11/01/2020   Hearing loss 11/01/2020   Hematochezia 03/26/2022   History of adenomatous polyp of colon 11/01/2020   History of hematuria 11/01/2020   Hyperlipidemia    ILD (interstitial lung disease) (HCC) 07/12/2020   Iliac artery occlusion, left (HCC) 01/13/2019   Incarcerated incisional hernia with SBO 12/28/2020   Incarcerated ventral hernia 12/28/2020   IPF (idiopathic pulmonary fibrosis) (HCC) 08/25/2021   Long term (current) use of aspirin 11/01/2020   Osteoarthritis of knee 11/01/2020   Osteopenia    Other skin changes due to chronic exposure to nonionizing radiation 11/01/2020   Parietoalveolar pneumopathy (HCC) 11/01/2020   Peripheral vascular disease (HCC) 11/01/2020   Personal history of (healed) traumatic fracture 11/01/2020   Personal history of other malignant neoplasm of skin 11/01/2020   Polyclonal gammopathy 10/23/2022   Pulmonary hypertension (HCC) 03/26/2022   Recurrent ventral incisional hernia 12/28/2020  Right knee pain 03/02/2015   SBO (small bowel obstruction) (HCC) 10/26/2020   Squamous cell carcinoma of skin 07/21/2019   in situ- fornt center scalp (CX35FU)   Squamous cell carcinoma, face 12/13/2018   in situ- left forehead(CX35FU)   Stopped smoking 11/01/2020   Ventral hernia 11/01/2020    Past Surgical History:  Procedure Laterality Date   ABDOMINAL WALL MESH  REMOVAL  05/29/2008   COLONOSCOPY WITH PROPOFOL N/A 06/19/2022   Procedure: COLONOSCOPY WITH PROPOFOL;  Surgeon: Kerin Salen, MD;  Location: [facility];  Service: Gastroenterology;  Laterality: N/A;   COLONOSCOPY WITH PROPOFOL N/A 11/13/2022   Procedure: COLONOSCOPY WITH PROPOFOL;  Surgeon: Kerin Salen, MD;  Location: [facility];  Service: Gastroenterology;  Laterality: N/A;   FEMORAL-FEMORAL BYPASS GRAFT Left 01/13/2019   Procedure: BYPASS GRAFT FEMORAL-FEMORAL ARTERY;  Surgeon: Chuck Hint, MD;  Location: [facility] OR;  Service: Vascular;  Laterality: Left;   INGUINAL HERNIA REPAIR N/A 12/28/2020   Procedure: LAPAROSCOPIC VENTERAL HERNIA REPAIR WITH MESH, LYSIS OF ADHESIONS;  Surgeon: Karie Soda, MD;  Location: [facility];  Service: General;  Laterality: N/A;   LUMBAR LAMINECTOMY     ORCHIECTOMY Left 01/12/2019   Procedure: LEFT INGUINAL RADICAL ORCHIECTOMY;  Surgeon: Crista Elliot, MD;  [facility];  Service: Urology;  Laterality: Left;   POLYPECTOMY  06/19/2022   Procedure: POLYPECTOMY;  Surgeon: Kerin Salen, MD;  [facility]: [facility];  Service: Gastroenterology;;   ROTATOR CUFF REPAIR Right    SMALL INTESTINE SURGERY  05/29/2008   SBO into mesh - SB resection, mesh removal & primary hernia repair   UMBILICAL HERNIA REPAIR  2009   open with Ventralex mesh patch    Current Medications: Current Meds  Medication Sig   Calcium-Phosphorus-Vitamin D (CITRACAL +D3 PO) Take 1 tablet by mouth every evening.   Cholecalciferol 125 MCG (5000 UT) TABS Take 1,000 Units by mouth every evening.   Cyanocobalamin (VITAMIN B12 PO) Take 1,000 mcg by mouth in the morning.   ferrous sulfate 325 (65 FE) MG tablet Take 325 mg by mouth every evening.   folic acid (FOLVITE) 1 MG tablet Take 1 mg by mouth every evening.   loratadine (CLARITIN) 10 MG tablet Take 1 tablet (10 mg total) by mouth daily.   methotrexate (RHEUMATREX) 2.5 MG tablet Take 10 mg by mouth every Wednesday.   Nintedanib (OFEV) 100 MG CAPS Take 1 capsule (100 mg total) by mouth 2 (two) times daily.   Omega-3 Fatty Acids (OMEGA-3 FISH OIL PO) Take 1  tablet by mouth in the morning, at noon, and at bedtime. Omega XL   omeprazole (PRILOSEC) 10 MG capsule Take 10 mg by mouth daily as needed (indigestion/heartburn.).   predniSONE (DELTASONE) 5 MG tablet Take 5 mg by mouth daily with breakfast.   sertraline (ZOLOFT) 100 MG tablet Take 100 mg by mouth every evening.   [DISCONTINUED] aspirin EC 81 MG tablet Take 81 mg by mouth daily. Swallow whole.   [DISCONTINUED] OVER THE COUNTER MEDICATION Place 4 patches onto the skin daily. LifeWave X-39 Patches  [employer]  Light Therapy (stimulate cell growth)   [DISCONTINUED] rosuvastatin (CRESTOR) 10 MG tablet Take 10 mg by mouth in the morning.     Allergies:   Patient has no known allergies.   Social History   Socioeconomic History   Marital status: Widowed    Spouse name: Mitch Arquette   Number of children: 2   Years of education: 12th grade  Highest education level: Not on file  Occupational History   Occupation: grading  Tobacco Use   Smoking status: Former    Current packs/day: 1.00    Average packs/day: 1 pack/day for 28.0 years (28.0 ttl pk-yrs)    Types: Cigarettes   Smokeless tobacco: Never   Tobacco comments:    quit 1992  Vaping Use   Vaping status: Never Used  Substance and Sexual Activity   Alcohol use: Yes    Alcohol/week: 6.0 standard drinks of alcohol    Types: 6 Cans of beer per week    Comment: beer occasional   Drug use: Never   Sexual activity: Not Currently  Other Topics Concern   Not on file  Social History Narrative   Not on file   Social Drivers of Health   Financial Resource Strain: Not on file  Food Insecurity: Not on file  Transportation Needs: Not on file  Physical Activity: Not on file  Stress: Not on file  Social Connections: Unknown (12/22/2021)   [facility]   Social Network    Social Network: Not on file     Family History: The patient's family history is negative for Other. ROS:    Please see the history of present illness.    All 14 point review of systems negative except as described per history of present illness  EKGs/Labs/Other Studies Reviewed:    EKG Interpretation Date/Time:  Tuesday November 19 2023 15:11:02 EDT Ventricular Rate:  77 PR Interval:  192 QRS Duration:  106 QT Interval:  372 QTC Calculation: 420 R Axis:   -41  Text Interpretation: Normal sinus rhythm Left axis deviation Moderate voltage criteria for LVH, may be normal variant ( R in aVL , Cornell product ) When compared with ECG of 29-Aug-2022 15:15, PREVIOUS ECG IS PRESENT Confirmed by Gypsy Balsam [phone]) on 11/19/2023 3:24:58 PM    Recent Labs: 09/16/2023: ALT 14; BUN 13; Creatinine, Ser 0.84; Hemoglobin 12.5; Platelets 312.0; Potassium 4.4; Pro B Natriuretic peptide (BNP) 83.0; Sodium 134  Recent Lipid Panel No results found for: "CHOL", "TRIG", "HDL", "CHOLHDL", "VLDL", "LDLCALC", "LDLDIRECT"  Physical Exam:    VS:  BP 130/74 (BP Location: Right Arm, Patient Position: Sitting)   Pulse (!) 54   Ht 6' (1.829 m)   Wt 182 lb (82.6 kg)   SpO2 (!) 89%   BMI 24.68 kg/m     Wt Readings from Last 3 Encounters:  11/19/23 182 lb (82.6 kg)  09/16/23 174 lb 3.2 oz (79 kg)  01/09/23 173 lb 6.4 oz (78.7 kg)     GEN:  Well nourished, well developed in no acute distress HEENT: Normal NECK: No JVD; No carotid bruits LYMPHATICS: No lymphadenopathy CARDIAC: RRR, no murmurs, no rubs, no gallops RESPIRATORY:  Clear to auscultation without rales, few faint crackles present on both bases ABDOMEN: Soft, non-tender, non-distended MUSCULOSKELETAL:  No edema; No deformity  SKIN: Warm and dry LOWER EXTREMITIES: no swelling NEUROLOGIC:  Alert and oriented x 3 PSYCHIATRIC:  Normal affect   ASSESSMENT:    1. PVD (peripheral vascular disease) (HCC)   2. Peripheral vascular disease (HCC)   3. ILD (interstitial lung disease) (HCC)   4. DOE (dyspnea on exertion)   5. Stopped smoking    PLAN:     In order of problems listed above:  Peripheral vascular disease followed by vascular surgeon from [place].  He is scheduled to see them in May which is next month. Dyslipidemia intolerant to rosuvastatin  that supposed to give him diarrhea.  Will try Lipitor 10. Smoking he quit 1995 encouraged him to stay away from it. Idiopathic interstitial pulmonary fibrosis followed by pulmonary stable   Medication Adjustments/Labs and Tests Ordered: Current medicines are reviewed at length with the patient today.  Concerns regarding medicines are outlined above.  Orders Placed This Encounter  Procedures   EKG 12-Lead   Medication changes: No orders of the defined types were placed in this encounter.   Signed, Georgeanna Lea, MD, [facility] 11/19/2023 3:35 PM    [facility]

## 2023-11-19 NOTE — Addendum Note (Signed)
 Addended by: Baldo Ash D on: 11/19/2023 03:55 PM   Modules accepted: Orders

## 2023-12-23 DIAGNOSIS — L578 Other skin changes due to chronic exposure to nonionizing radiation: Secondary | ICD-10-CM | POA: Diagnosis not present

## 2023-12-23 DIAGNOSIS — L814 Other melanin hyperpigmentation: Secondary | ICD-10-CM | POA: Diagnosis not present

## 2023-12-23 DIAGNOSIS — L570 Actinic keratosis: Secondary | ICD-10-CM | POA: Diagnosis not present

## 2023-12-23 DIAGNOSIS — D485 Neoplasm of uncertain behavior of skin: Secondary | ICD-10-CM | POA: Diagnosis not present

## 2023-12-23 DIAGNOSIS — L821 Other seborrheic keratosis: Secondary | ICD-10-CM | POA: Diagnosis not present

## 2023-12-27 ENCOUNTER — Other Ambulatory Visit: Payer: Self-pay | Admitting: *Deleted

## 2023-12-27 DIAGNOSIS — I745 Embolism and thrombosis of iliac artery: Secondary | ICD-10-CM

## 2023-12-27 DIAGNOSIS — I739 Peripheral vascular disease, unspecified: Secondary | ICD-10-CM

## 2024-01-02 DIAGNOSIS — D0439 Carcinoma in situ of skin of other parts of face: Secondary | ICD-10-CM | POA: Diagnosis not present

## 2024-01-03 ENCOUNTER — Ambulatory Visit (HOSPITAL_BASED_OUTPATIENT_CLINIC_OR_DEPARTMENT_OTHER)
Admission: RE | Admit: 2024-01-03 | Discharge: 2024-01-03 | Disposition: A | Discharge status: Home / Self Care | Payer: Medicare Other | Source: Ambulatory Visit | Attending: Vascular Surgery | Admitting: Vascular Surgery

## 2024-01-03 ENCOUNTER — Ambulatory Visit (INDEPENDENT_AMBULATORY_CARE_PROVIDER_SITE_OTHER): Payer: Medicare Other | Admitting: Physician Assistant

## 2024-01-03 ENCOUNTER — Ambulatory Visit (HOSPITAL_COMMUNITY)
Admission: RE | Admit: 2024-01-03 | Discharge: 2024-01-03 | Disposition: A | Discharge status: Home / Self Care | Payer: Medicare Other | Source: Ambulatory Visit | Attending: Vascular Surgery | Admitting: Vascular Surgery

## 2024-01-03 VITALS — BP 118/76 | HR 76 | Temp 98.0°F | Wt 183.6 lb

## 2024-01-03 DIAGNOSIS — I745 Embolism and thrombosis of iliac artery: Secondary | ICD-10-CM

## 2024-01-03 DIAGNOSIS — I739 Peripheral vascular disease, unspecified: Secondary | ICD-10-CM

## 2024-01-03 LAB — VAS US ABI WITH/WO TBI
Left ABI: 1.28
Right ABI: 1.16

## 2024-01-03 NOTE — Progress Notes (Signed)
 [facility] [place] HISTORY AND PHYSICAL   History of Present Illness:  Patient is a 78 y.o. year old male who presents for evaluation of PAD s/p right to left femoral to femoral bypass with PTFE and oversewing of the left EIA on 01/13/2019 by Dr. Shaunna Delaware. This was performed due to left LE ischemia.    He denies claudication, rest pain or non healing wounds.  His mobility is limitied due to pulmonary status and DOE.   The pt is on a statin for cholesterol management.    The pt is on an aspirin .    Other AC:  none The pt is not on medication for hypertension.  The pt is not on medication for diabetes. Tobacco hx:  former   Past Medical History:  Diagnosis Date   Allergic rhinitis 07/12/2020   Basal cell carcinoma 02/21/2011   left inner cheek, lateral (CX35FU)   Body mass index (BMI) 30.0-30.9, adult 11/01/2020   Combined forms of age-related cataract of left eye 01/26/2020   Combined forms of age-related cataract of right eye 01/14/2020   Cramp in lower leg associated with rest 11/01/2020   Depression    DOE (dyspnea on exertion) 02/03/2020   Onset summer 2020   -  02/03/2020   Walked RA  3 laps @ approx 236ft each @   fast pace  stopped due to end of study,    sats 94% at end and minimally sob  - Collagen vascular profile 02/03/2020 >>>  Neg       Lab Results  Component  Value  Date     ESRSEDRATE  18  02/03/2020  - Allergy profile 02/03/20 >  Eos 0.4 /  IgE  1344   - HSP serology 02/03/2020  Neg   - HRCT 02/05/2020  compatible with inters   ED (erectile dysfunction) of organic origin 11/01/2020   Elevated PSA 10/23/2022   Epididymal cyst 11/01/2020   Exacerbation of asthma 11/01/2020   Family history of ischemic heart disease (IHD) 11/01/2020   Family history of malignant neoplasm of gastrointestinal tract 11/01/2020   Femoral hernia of left side 10/23/2022   Gastroesophageal reflux disease without esophagitis 11/01/2020   Generalized anxiety disorder 11/01/2020    GERD (gastroesophageal reflux disease)    Hardening of the aorta (main artery of the heart) (HCC) 11/01/2020   Hearing loss 11/01/2020   Hematochezia 03/26/2022   History of adenomatous polyp of colon 11/01/2020   History of hematuria 11/01/2020   Hyperlipidemia    ILD (interstitial lung disease) (HCC) 07/12/2020   Iliac artery occlusion, left (HCC) 01/13/2019   Incarcerated incisional hernia with SBO 12/28/2020   Incarcerated ventral hernia 12/28/2020   IPF (idiopathic pulmonary fibrosis) (HCC) 08/25/2021   Long term (current) use of aspirin  11/01/2020   Osteoarthritis of knee 11/01/2020   Osteopenia    Other skin changes due to chronic exposure to nonionizing radiation 11/01/2020   Parietoalveolar pneumopathy (HCC) 11/01/2020   Peripheral vascular disease (HCC) 11/01/2020   Personal history of (healed) traumatic fracture 11/01/2020   Personal history of other malignant neoplasm of skin 11/01/2020   Polyclonal gammopathy 10/23/2022   Pulmonary hypertension (HCC) 03/26/2022   Recurrent ventral incisional hernia 12/28/2020   Right knee pain 03/02/2015   SBO (small bowel obstruction) (HCC) 10/26/2020   Squamous cell carcinoma of skin 07/21/2019   in situ- fornt center scalp (CX35FU)   Squamous cell carcinoma, face 12/13/2018   in situ- left forehead(CX35FU)   Stopped smoking 11/01/2020  Ventral hernia 11/01/2020    Past Surgical History:  Procedure Laterality Date   ABDOMINAL WALL MESH  REMOVAL  05/29/2008   COLONOSCOPY WITH PROPOFOL  N/A 06/19/2022   Procedure: COLONOSCOPY WITH PROPOFOL ;  Surgeon: Genell Ken, MD;  Location: [facility];  Service: Gastroenterology;  Laterality: N/A;   COLONOSCOPY WITH PROPOFOL  N/A 11/13/2022   Procedure: COLONOSCOPY WITH PROPOFOL ;  Surgeon: Genell Ken, MD;  Location: [facility];  Service: Gastroenterology;  Laterality: N/A;   FEMORAL-FEMORAL BYPASS GRAFT Left 01/13/2019   Procedure: BYPASS GRAFT FEMORAL-FEMORAL ARTERY;  Surgeon: Dannis Dy, MD;  Location: [facility] OR;  Service: Vascular;  Laterality: Left;   INGUINAL HERNIA REPAIR N/A 12/28/2020   Procedure: LAPAROSCOPIC VENTERAL HERNIA REPAIR WITH MESH, LYSIS OF ADHESIONS;  Surgeon: Candyce Champagne, MD;  Location: [facility];  Service: General;  Laterality: N/A;   LUMBAR LAMINECTOMY     ORCHIECTOMY Left 01/12/2019   Procedure: LEFT INGUINAL RADICAL ORCHIECTOMY;  Surgeon: Samson Croak, MD;  Location: [facility];  Service: Urology;  Laterality: Left;   POLYPECTOMY  06/19/2022   Procedure: POLYPECTOMY;  Surgeon: Genell Ken, MD;  Location: [facility];  Service: Gastroenterology;;   ROTATOR CUFF REPAIR Right    SMALL INTESTINE SURGERY  05/29/2008   SBO into mesh - SB resection, mesh removal & primary hernia repair   UMBILICAL HERNIA REPAIR  2009   open with Ventralex mesh patch    ROS:   General:  No weight loss, Fever, chills  HEENT: No recent headaches, no nasal bleeding, no visual changes, no sore throat  Neurologic: No dizziness, blackouts, seizures. No recent symptoms of stroke or mini- stroke. No recent episodes of slurred speech, or temporary blindness.  Cardiac: No recent episodes of chest pain/pressure, no shortness of breath at rest.  No shortness of breath with exertion.  Denies history of atrial fibrillation or irregular heartbeat  Vascular: No history of rest pain in feet.  No history of claudication.  No history of non-healing ulcer, No history of DVT   Pulmonary: No home oxygen, no productive cough, no hemoptysis,  No asthma or wheezing  Musculoskeletal:  [ ]  Arthritis, [ ]  Low back pain,  [ ]  Joint pain  Hematologic:No history of hypercoagulable state.  No history of easy bleeding.  No history of anemia  Gastrointestinal: No hematochezia or melena,  No gastroesophageal reflux, no trouble swallowing  Urinary: [ ]  chronic Kidney disease, [ ]  on HD - [ ]  MWF or [ ]  TTHS, [ ]  Burning with urination, [ ]  Frequent urination, [ ]   Difficulty urinating;   Skin: No rashes  Psychological: No history of anxiety,  No history of depression  Social History Social History   Tobacco Use   Smoking status: Former    Current packs/day: 1.00    Average packs/day: 1 pack/day for 28.0 years (28.0 ttl pk-yrs)    Types: Cigarettes   Smokeless tobacco: Never   Tobacco comments:    quit 1992  Vaping Use   Vaping status: Never Used  Substance Use Topics   Alcohol use: Yes    Alcohol/week: 6.0 standard drinks of alcohol    Types: 6 Cans of beer per week    Comment: beer occasional   Drug use: Never    Family History Family History  Problem Relation Age of Onset   Other Neg Hx        No FHx of small bowel obstruction    Allergies  No Known Allergies   Current  Outpatient Medications  Medication Sig Dispense Refill   atorvastatin  (LIPITOR) 10 MG tablet Take 1 tablet (10 mg total) by mouth daily. 90 tablet 3   Calcium -Phosphorus-Vitamin D  (CITRACAL +D3 PO) Take 1 tablet by mouth every evening.     Cholecalciferol 125 MCG (5000 UT) TABS Take 1,000 Units by mouth every evening.     Cyanocobalamin  (VITAMIN B12 PO) Take 1,000 mcg by mouth in the morning.     ferrous sulfate 325 (65 FE) MG tablet Take 325 mg by mouth every evening.     folic acid (FOLVITE) 1 MG tablet Take 1 mg by mouth every evening.     loratadine  (CLARITIN ) 10 MG tablet Take 1 tablet (10 mg total) by mouth daily.     methotrexate (RHEUMATREX) 2.5 MG tablet Take 10 mg by mouth every Wednesday.     Nintedanib (OFEV ) 100 MG CAPS Take 1 capsule (100 mg total) by mouth 2 (two) times daily. 180 capsule 0   Omega-3 Fatty Acids (OMEGA-3 FISH OIL PO) Take 1 tablet by mouth in the morning, at noon, and at bedtime. Omega XL     omeprazole (PRILOSEC) 10 MG capsule Take 10 mg by mouth daily as needed (indigestion/heartburn.).     predniSONE (DELTASONE) 5 MG tablet Take 5 mg by mouth daily with breakfast.     sertraline  (ZOLOFT ) 100 MG tablet Take 100 mg by mouth  every evening.     No current facility-administered medications for this visit.    Physical Examination  Vitals:   01/03/24 1223  BP: 118/76  Pulse: 76  Temp: 98 F (36.7 C)  TempSrc: Temporal  SpO2: 94%  Weight: 183 lb 9.6 oz (83.3 kg)    Body mass index is 24.9 kg/m.  General:  Alert and oriented, no acute distress HEENT: Normal Neck: No bruit or JVD Pulmonary: Clear to auscultation bilaterally Cardiac: Regular Rate and Rhythm without murmur Abdomen: Soft, non-tender, non-distended, no mass, no scars Skin: No rash Extremity Pulses:  2+ radial, fem-fem bypass, dorsalis pedis,  pulses bilaterally Musculoskeletal: No deformity or edema  Neurologic: Upper and lower extremity motor 5/5 and symmetric  DATA:  ABI Findings:  +---------+------------------+-----+--------+--------+  Right   Rt Pressure (mmHg)IndexWaveformComment   +---------+------------------+-----+--------+--------+  Brachial 130                                      +---------+------------------+-----+--------+--------+  PTA     152               1.16 biphasic          +---------+------------------+-----+--------+--------+  DP      138               1.05 biphasic          +---------+------------------+-----+--------+--------+  Great Toe138               1.05 Normal            +---------+------------------+-----+--------+--------+   +---------+------------------+-----+---------+-------+  Left    Lt Pressure (mmHg)IndexWaveform Comment  +---------+------------------+-----+---------+-------+  Brachial 131                                      +---------+------------------+-----+---------+-------+  PTA     168               1.28 triphasic         +---------+------------------+-----+---------+-------+  DP      162               1.24 biphasic          +---------+------------------+-----+---------+-------+  Great Toe127               0.97 Normal             +---------+------------------+-----+---------+-------+   +-------+-----------+-----------+------------+------------+  ABI/TBIToday's ABIToday's TBIPrevious ABIPrevious TBI  +-------+-----------+-----------+------------+------------+  Right 1.16       1.05       1.19        1.1           +-------+-----------+-----------+------------+------------+  Left  1.28       0.97       1.2         1.17          +-------+-----------+-----------+------------+------------+     Bilateral ABIs and TBIs appear essentially unchanged.    Summary:  Right: Resting right ankle-brachial index is within normal range. The  right toe-brachial index is normal.   Left: Resting left ankle-brachial index is within normal range. The left  toe-brachial index is normal.     ASSESSMENT/PLAN:   78 y.o. male here for follow up for PAD with hx of  ischemia of LLE with occluded left EIA and right to left femoral to femoral bypass with PTFE and oversewing of the left EIA on 01/13/2019 by Dr. Shaunna Delaware.    He has maintained inflow to his feet and is asymptomatic for ischemia.  He has easily palpable pedal pulses B.  There is a pulse in the fem-fem bypass as well.  The duplex shows a patent fem-fem bypass and the ABI/TBI's are normal.  He will continue to stay active as tolerates and f/u in 16 month for repeat duplex and ABI's.  If he develops symptoms of ischemia he will call our office.      Rocky Cipro PA-C [facility] [place] Office: [phone]  MD in clinic [place]

## 2024-01-14 DIAGNOSIS — M1990 Unspecified osteoarthritis, unspecified site: Secondary | ICD-10-CM | POA: Diagnosis not present

## 2024-01-14 DIAGNOSIS — M25511 Pain in right shoulder: Secondary | ICD-10-CM | POA: Diagnosis not present

## 2024-01-14 DIAGNOSIS — Z79899 Other long term (current) drug therapy: Secondary | ICD-10-CM | POA: Diagnosis not present

## 2024-01-14 DIAGNOSIS — M059 Rheumatoid arthritis with rheumatoid factor, unspecified: Secondary | ICD-10-CM | POA: Diagnosis not present

## 2024-01-30 DIAGNOSIS — R030 Elevated blood-pressure reading, without diagnosis of hypertension: Secondary | ICD-10-CM | POA: Diagnosis not present

## 2024-01-30 DIAGNOSIS — M810 Age-related osteoporosis without current pathological fracture: Secondary | ICD-10-CM | POA: Diagnosis not present

## 2024-02-13 DIAGNOSIS — R972 Elevated prostate specific antigen [PSA]: Secondary | ICD-10-CM | POA: Diagnosis not present

## 2024-02-19 DIAGNOSIS — M810 Age-related osteoporosis without current pathological fracture: Secondary | ICD-10-CM | POA: Diagnosis not present

## 2024-02-20 DIAGNOSIS — D400 Neoplasm of uncertain behavior of prostate: Secondary | ICD-10-CM | POA: Diagnosis not present

## 2024-02-20 DIAGNOSIS — R972 Elevated prostate specific antigen [PSA]: Secondary | ICD-10-CM | POA: Diagnosis not present

## 2024-02-20 DIAGNOSIS — R351 Nocturia: Secondary | ICD-10-CM | POA: Diagnosis not present

## 2024-03-04 DIAGNOSIS — E785 Hyperlipidemia, unspecified: Secondary | ICD-10-CM | POA: Diagnosis not present

## 2024-03-05 ENCOUNTER — Ambulatory Visit: Payer: Self-pay | Admitting: Cardiology

## 2024-03-05 LAB — LIPID PANEL
Chol/HDL Ratio: 2.1 ratio (ref 0.0–5.0)
Cholesterol, Total: 167 mg/dL (ref 100–199)
HDL: 78 mg/dL (ref >39)
LDL Chol Calc (NIH): 75 mg/dL (ref 0–99)
Triglycerides: 72 mg/dL (ref 0–149)
VLDL Cholesterol Cal: 14 mg/dL (ref 5–40)

## 2024-03-05 LAB — ALT: ALT: 18 IU/L (ref 0–44)

## 2024-03-05 LAB — AST: AST: 28 IU/L (ref 0–40)

## 2024-03-17 ENCOUNTER — Ambulatory Visit
Admission: RE | Admit: 2024-03-17 | Discharge: 2024-03-17 | Disposition: A | Discharge status: Home / Self Care | Source: Ambulatory Visit | Attending: Pulmonary Disease | Admitting: Pulmonary Disease

## 2024-03-17 DIAGNOSIS — J438 Other emphysema: Secondary | ICD-10-CM | POA: Diagnosis not present

## 2024-03-17 DIAGNOSIS — J479 Bronchiectasis, uncomplicated: Secondary | ICD-10-CM | POA: Diagnosis not present

## 2024-03-17 DIAGNOSIS — J84112 Idiopathic pulmonary fibrosis: Secondary | ICD-10-CM

## 2024-03-17 DIAGNOSIS — J8410 Pulmonary fibrosis, unspecified: Secondary | ICD-10-CM | POA: Diagnosis not present

## 2024-03-17 DIAGNOSIS — J849 Interstitial pulmonary disease, unspecified: Secondary | ICD-10-CM

## 2024-03-25 DIAGNOSIS — L570 Actinic keratosis: Secondary | ICD-10-CM | POA: Diagnosis not present

## 2024-03-25 DIAGNOSIS — L821 Other seborrheic keratosis: Secondary | ICD-10-CM | POA: Diagnosis not present

## 2024-03-25 DIAGNOSIS — B079 Viral wart, unspecified: Secondary | ICD-10-CM | POA: Diagnosis not present

## 2024-03-25 DIAGNOSIS — L578 Other skin changes due to chronic exposure to nonionizing radiation: Secondary | ICD-10-CM | POA: Diagnosis not present

## 2024-03-25 DIAGNOSIS — L814 Other melanin hyperpigmentation: Secondary | ICD-10-CM | POA: Diagnosis not present

## 2024-03-28 ENCOUNTER — Ambulatory Visit: Payer: Self-pay | Admitting: Pulmonary Disease

## 2024-04-15 DIAGNOSIS — M79642 Pain in left hand: Secondary | ICD-10-CM | POA: Diagnosis not present

## 2024-04-15 DIAGNOSIS — Z13220 Encounter for screening for lipoid disorders: Secondary | ICD-10-CM | POA: Diagnosis not present

## 2024-04-15 DIAGNOSIS — M059 Rheumatoid arthritis with rheumatoid factor, unspecified: Secondary | ICD-10-CM | POA: Diagnosis not present

## 2024-04-15 DIAGNOSIS — M79641 Pain in right hand: Secondary | ICD-10-CM | POA: Diagnosis not present

## 2024-04-15 DIAGNOSIS — Z79899 Other long term (current) drug therapy: Secondary | ICD-10-CM | POA: Diagnosis not present

## 2024-04-15 DIAGNOSIS — M810 Age-related osteoporosis without current pathological fracture: Secondary | ICD-10-CM | POA: Diagnosis not present

## 2024-04-15 DIAGNOSIS — J8410 Pulmonary fibrosis, unspecified: Secondary | ICD-10-CM | POA: Diagnosis not present

## 2024-04-15 DIAGNOSIS — M1990 Unspecified osteoarthritis, unspecified site: Secondary | ICD-10-CM | POA: Diagnosis not present

## 2024-05-18 ENCOUNTER — Encounter: Payer: Self-pay | Admitting: Pulmonary Disease

## 2024-05-18 ENCOUNTER — Ambulatory Visit (INDEPENDENT_AMBULATORY_CARE_PROVIDER_SITE_OTHER): Admitting: Pulmonary Disease

## 2024-05-18 VITALS — BP 116/74 | HR 54 | Temp 98.1°F | Ht 72.0 in | Wt 180.0 lb

## 2024-05-18 DIAGNOSIS — J849 Interstitial pulmonary disease, unspecified: Secondary | ICD-10-CM | POA: Diagnosis not present

## 2024-05-18 DIAGNOSIS — R0600 Dyspnea, unspecified: Secondary | ICD-10-CM

## 2024-05-18 DIAGNOSIS — M05739 Rheumatoid arthritis with rheumatoid factor of unspecified wrist without organ or systems involvement: Secondary | ICD-10-CM

## 2024-05-18 DIAGNOSIS — Z5181 Encounter for therapeutic drug level monitoring: Secondary | ICD-10-CM

## 2024-05-18 LAB — BRAIN NATRIURETIC PEPTIDE: Pro B Natriuretic peptide (BNP): 147 pg/mL — ABNORMAL HIGH (ref 0.0–100.0)

## 2024-05-18 LAB — CBC WITH DIFFERENTIAL/PLATELET
Basophils Absolute: 0 K/uL (ref 0.0–0.1)
Basophils Relative: 0.2 % (ref 0.0–3.0)
Eosinophils Absolute: 0.2 K/uL (ref 0.0–0.7)
Eosinophils Relative: 4.8 % (ref 0.0–5.0)
HCT: 38 % — ABNORMAL LOW (ref 39.0–52.0)
Hemoglobin: 12.7 g/dL — ABNORMAL LOW (ref 13.0–17.0)
Lymphocytes Relative: 38 % (ref 12.0–46.0)
Lymphs Abs: 1.8 K/uL (ref 0.7–4.0)
MCHC: 33.5 g/dL (ref 30.0–36.0)
MCV: 105 fl — ABNORMAL HIGH (ref 78.0–100.0)
Monocytes Absolute: 0.6 K/uL (ref 0.1–1.0)
Monocytes Relative: 11.8 % (ref 3.0–12.0)
Neutro Abs: 2.2 K/uL (ref 1.4–7.7)
Neutrophils Relative %: 45.2 % (ref 43.0–77.0)
Platelets: 203 K/uL (ref 150.0–400.0)
RBC: 3.62 Mil/uL — ABNORMAL LOW (ref 4.22–5.81)
RDW: 13.5 % (ref 11.5–15.5)
WBC: 4.8 K/uL (ref 4.0–10.5)

## 2024-05-18 LAB — COMPREHENSIVE METABOLIC PANEL WITH GFR
ALT: 13 U/L (ref 0–53)
AST: 22 U/L (ref 0–37)
Albumin: 3.6 g/dL (ref 3.5–5.2)
Alkaline Phosphatase: 100 U/L (ref 39–117)
BUN: 10 mg/dL (ref 6–23)
CO2: 27 meq/L (ref 19–32)
Calcium: 9.4 mg/dL (ref 8.4–10.5)
Chloride: 104 meq/L (ref 96–112)
Creatinine, Ser: 0.86 mg/dL (ref 0.40–1.50)
GFR: 82.08 mL/min (ref >60.00)
Glucose, Bld: 89 mg/dL (ref 70–99)
Potassium: 4.1 meq/L (ref 3.5–5.1)
Sodium: 141 meq/L (ref 135–145)
Total Bilirubin: 0.6 mg/dL (ref 0.2–1.2)
Total Protein: 6.9 g/dL (ref 6.0–8.3)

## 2024-05-18 NOTE — Progress Notes (Signed)
 [name]    [id number]    Dec 25, 1945  Primary Care Physician:Pharr, Ryan, MD  Referring Physician: Clarice Ryan, MD [address] [place],  [place] [address]  Chief complaint:  Follow-up for interstitial lung disease, pulmonary fibrosis Nintedanib started November 2022.  Lower dose due to side effects of diarrhea Rheumatoid arthritis, on methotrexate  HPI: 78 y.o.  former smoker referred to pulmonary for evaluation of interstitial lung disease States that he has mild dyspnea on exertion.  No cough. Referred for evaluation of progressive probable UIP pulmonary fibrosis.  Started Ofev  in November 2022.  Dose lowered to 100 mg twice daily due to side effects of diarrhea  Patient has a diagnosis of seropositive rheumatoid arthritis and is currently on methotrexate, folic acid.  Previously on prednisone as well but discontinued in 2024 due to adverse side effects including anxiety.  Follows with Dr. Mikel, rheumatology at [facility]  Interim history: Discussed the use of AI scribe software for clinical note transcription with the patient, who gave verbal consent to proceed.  History of Present Illness [name] is a 78 year old male with pulmonary fibrosis and rheumatoid arthritis who presents for follow-up of his pulmonary condition. He is accompanied by his daughter.  Pulmonary fibrosis symptoms and management - Pulmonary fibrosis managed with nintedanib (Ofev ), taken twice daily - No new or worsening dyspnea - Gradual changes in breathing, but maintains some level of physical activity  Gastrointestinal side effects of nintedanib - Diarrhea attributed to nintedanib - Manages diarrhea by avoiding food and drink before appointments - Does not use medication for diarrhea - Diarrhea considered manageable  Rheumatoid arthritis symptoms and management - Rheumatoid arthritis managed with methotrexate, recently increased  to 20 mg weekly - Swelling present in the wrist, particularly in one joint   Relevant pulmonary history Pets: No pets Occupation: Works as a Orthoptist with heavy machinery. Exposures: Exposed to dust.  No asbestos exposure.  No mold, hot tub, Jacuzzi.  No down pillows or comforters Smoking history: 20-pack-year smoker.  Quit in the 1990s Travel history: No significant travel history Relevant family history: No significant family history of lung disease  Outpatient Encounter Medications as of 05/18/2024  Medication Sig   Calcium -Phosphorus-Vitamin D  (CITRACAL +D3 PO) Take 1 tablet by mouth every evening.   Cholecalciferol 125 MCG (5000 UT) TABS Take 1,000 Units by mouth every evening.   Cyanocobalamin  (VITAMIN B12 PO) Take 1,000 mcg by mouth in the morning.   ferrous sulfate 325 (65 FE) MG tablet Take 325 mg by mouth every evening.   folic acid (FOLVITE) 1 MG tablet Take 1 mg by mouth every evening.   loratadine  (CLARITIN ) 10 MG tablet Take 1 tablet (10 mg total) by mouth daily.   methotrexate (RHEUMATREX) 2.5 MG tablet Take 10 mg by mouth every Wednesday.   Nintedanib (OFEV ) 100 MG CAPS Take 1 capsule (100 mg total) by mouth 2 (two) times daily.   Omega-3 Fatty Acids (OMEGA-3 FISH OIL PO) Take 1 tablet by mouth in the morning, at noon, and at bedtime. Omega XL   sertraline  (ZOLOFT ) 100 MG tablet Take 100 mg by mouth every evening.   atorvastatin  (LIPITOR) 10 MG tablet Take 1 tablet (10 mg total) by mouth daily. (Patient not taking: Reported on 05/18/2024)   omeprazole (PRILOSEC) 10 MG capsule Take 10 mg by mouth daily as needed (indigestion/heartburn.). (Patient not taking: Reported on 05/18/2024)   predniSONE (DELTASONE)  5 MG tablet Take 5 mg by mouth daily with breakfast. (Patient not taking: Reported on 05/18/2024)   No facility-administered encounter medications on file as of 05/18/2024.   Vitals:   05/18/24 0919  BP: 116/74  Pulse: (!) 54  Temp: 98.1 F (36.7 C)  Height: 6'  (1.829 m)  Weight: 180 lb (81.6 kg)  SpO2: 96%  TempSrc: Oral  BMI (Calculated): 24.41     Physical Exam GEN: No acute distress CV: Regular rate and rhythm no murmurs LUNGS: Clear to auscultation bilaterally normal respiratory effort SKIN JOINTS: Warm and dry no rash    Data Reviewed: Imaging: High-res CT 02/05/2020-groundglass attenuation with reticulation and traction bronchiectasis with basal gradient.  2 to 4 mm pulmonary nodules.  Probable UIP pattern.  High-resolution CT 02/06/2021-stable pattern of pulmonary fibrosis and probable UIP pattern.  Stable pulm nodules.  High-res CT 02/20/2022-slight interval progression with honeycombing.  Probable UIP.  High-res CT 06/19/2023-mildly progressive fibrosis, UIP pattern.  High-res CT 03/21/2024-minimally progressive pulmonary fibrosis, UIP pattern I have reviewed the images personally.  PFTs: 04/11/2020 FVC 3.10 [69%], FEV1 2.56 [78%], F/F 83, TLC 4.75 [64%], DLCO 21.55 [81%] Moderate restriction  04/21/2021 FVC 2.68 [60%], FEV1 2.29 [71%], F/F 85, TLC 4.36 [59%], DLCO 17.29 [66%] Moderate restriction, severe diffusion defect.  Worsening lung volumes and diffusion defect  07/20/2022 FVC 2.35 [53%], FEV1 1.98 [62%], F/F84, TLC 3.76 [51%], DLCO 14.84 [57%] Severe restriction, moderate diffusion defect  09/13/2023 FVC 2.38 [54%), FEV1 2.00 [64%], F/F84, TLC 4.33 [58%], DLCO 12.75 [49%] Moderate restriction, moderate diffusion defect  Labs: IgE 02/02/2020-1344 Hypersensitivity panel 02/03/2020-negative ANA, CCP, rheumatoid factor 02/03/2020-negative  CTD serologies 04/11/2020-negative  Cardiology: Echocardiogram  03/13/2022-LVEF 60 to 65%, grade 1 diastolic dysfunction, reduced TAPSE 07/27/2022-LVEF 60 to 65%, grade 1 diastolic dysfunction, mildly elevated PA systolic pressure 10/17/2022-LVEF 65 to 70%, mild LVH, normal PA systolic pressure, normal RV size and function.  Assessment & Plan Pulmonary fibrosis secondary to rheumatoid  arthritis Pulmonary fibrosis and UIP pattern is slightly worse but not dramatically. Symptoms are well-managed with no significant increase in dyspnea or edema. The fibrosis is attributed to rheumatoid arthritis, which can cause lung scarring. - Order liver function tests and blood counts to monitor effects of nintedanib - Schedule follow-up lung function test in six months  Diarrhea due to nintedanib therapy Diarrhea is a tolerable side effect of nintedanib therapy, managed by dietary adjustments such as avoiding coffee and food before appointments. No current use of antidiarrheal medication. - Offer prescription for Imodium  if diarrhea becomes unmanageable  Rheumatoid arthritis Rheumatoid arthritis is managed with methotrexate, recently increased to 20 mg weekly by the rheumatologist, Dr. Mikel.   Concern for pulmonary hypertension Echo reviewed with reduced TAPSE in 2023.  I discussed with Dr. Bernie, cardiology.   Follow-up echocardiogram with no evidence of pulmonary hypertension.  Continue monitoring.  Check BNP.  Plan/Recommendations: Continue Ofev  at 100 mg twice daily Imodium  as needed Monitor hepatic panel, CBC, BNP  Lonna Coder MD [facility] 05/18/2024, 9:26 AM  CC: Clarice Nottingham, MD  Addendum:

## 2024-05-18 NOTE — Patient Instructions (Addendum)
  VISIT SUMMARY: Today, we reviewed your pulmonary fibrosis and rheumatoid arthritis. Your pulmonary fibrosis remains stable with no significant worsening of symptoms. We also discussed the gastrointestinal side effects of your medication and your rheumatoid arthritis management.  YOUR PLAN: PULMONARY FIBROSIS: Your pulmonary fibrosis is slightly worse but remains well-managed with your current medication, nintedanib. -We will order liver function tests and blood counts to monitor the effects of nintedanib. -You will have a follow-up lung function test in six months.  DIARRHEA DUE TO NINTEDANIB THERAPY: You are experiencing diarrhea as a side effect of nintedanib, but it is manageable with dietary adjustments. -If the diarrhea becomes unmanageable, we can prescribe Imodium .  RHEUMATOID ARTHRITIS: Your rheumatoid arthritis is being managed with methotrexate, which was recently increased to 20 mg weekly. -Continue taking methotrexate as prescribed by Dr. Mikel

## 2024-05-30 ENCOUNTER — Ambulatory Visit: Payer: Self-pay | Admitting: Pulmonary Disease

## 2024-05-30 DIAGNOSIS — R0600 Dyspnea, unspecified: Secondary | ICD-10-CM

## 2024-05-30 DIAGNOSIS — R0602 Shortness of breath: Secondary | ICD-10-CM

## 2024-06-01 NOTE — Telephone Encounter (Signed)
 Copied from CRM [id number]. Topic: Appointments - Scheduling Inquiry for Clinic >> Jun 01, 2024 11:02 AM [name] wrote: Reason for CRM: Patient called returning [name] phone call regarding scheduling EKG. Contacted [facility] [place] spoke with Mel stated a f/u call would be made, advised patient. Patient was thankful and verbalized understanding.  This has been handled.

## 2024-06-01 NOTE — Telephone Encounter (Signed)
 Spoke with patient regarding the 06/11/24 11:30 am Echocardiogram appointment at [facility]--arrival time is 11:15 am for an 11:30 am appointment.  Will mail information to patient and he voiced his understanding.

## 2024-06-03 ENCOUNTER — Other Ambulatory Visit: Payer: Self-pay | Admitting: Pulmonary Disease

## 2024-06-03 DIAGNOSIS — R0602 Shortness of breath: Secondary | ICD-10-CM

## 2024-06-03 DIAGNOSIS — R0600 Dyspnea, unspecified: Secondary | ICD-10-CM

## 2024-06-11 ENCOUNTER — Ambulatory Visit (HOSPITAL_BASED_OUTPATIENT_CLINIC_OR_DEPARTMENT_OTHER)
Admission: RE | Admit: 2024-06-11 | Discharge: 2024-06-11 | Disposition: A | Discharge status: Home / Self Care | Source: Ambulatory Visit | Attending: Pulmonary Disease | Admitting: Pulmonary Disease

## 2024-06-11 DIAGNOSIS — R0602 Shortness of breath: Secondary | ICD-10-CM | POA: Diagnosis not present

## 2024-06-11 DIAGNOSIS — R0609 Other forms of dyspnea: Secondary | ICD-10-CM

## 2024-06-11 DIAGNOSIS — R0600 Dyspnea, unspecified: Secondary | ICD-10-CM | POA: Diagnosis not present

## 2024-06-11 LAB — ECHOCARDIOGRAM COMPLETE
AR max vel: 3.2 cm2
AV Area VTI: 3.57 cm2
AV Area mean vel: 3.15 cm2
AV Mean grad: 3 mmHg
AV Peak grad: 5.5 mmHg
Ao pk vel: 1.17 m/s
Area-P 1/2: 2.14 cm2
Calc EF: 61.5 %
MV M vel: 4.91 m/s
MV Peak grad: 96.4 mmHg
MV VTI: 0.57 cm2
Radius: 0.4 cm
S' Lateral: 2.4 cm
Single Plane A2C EF: 60.8 %
Single Plane A4C EF: 61.3 %

## 2024-06-12 ENCOUNTER — Other Ambulatory Visit (HOSPITAL_COMMUNITY): Payer: Self-pay

## 2024-06-19 ENCOUNTER — Telehealth: Payer: Self-pay

## 2024-06-19 ENCOUNTER — Other Ambulatory Visit (HOSPITAL_COMMUNITY): Payer: Self-pay

## 2024-06-19 DIAGNOSIS — J849 Interstitial pulmonary disease, unspecified: Secondary | ICD-10-CM

## 2024-06-19 NOTE — Telephone Encounter (Signed)
 Received notification from [employer] regarding a prior authorization for OFEV . Authorization has been APPROVED from 08/14/23 to 06/19/25. Approval letter sent to scan center.  Phone # [phone]

## 2024-06-19 NOTE — Telephone Encounter (Signed)
 Received fax from [facility] that pt is due for PAP renewal for 2026. Submitted a Prior Authorization request to [facility] for OFEV  via CoverMyMeds. Will update once we receive a response.  Key: [id number]   Started PAP application, placed in PAP pending info folder will we wait for response for pa.

## 2024-06-24 ENCOUNTER — Ambulatory Visit: Payer: Self-pay | Admitting: Pulmonary Disease

## 2024-06-29 DIAGNOSIS — H66001 Acute suppurative otitis media without spontaneous rupture of ear drum, right ear: Secondary | ICD-10-CM | POA: Diagnosis not present

## 2024-07-16 DIAGNOSIS — Z79899 Other long term (current) drug therapy: Secondary | ICD-10-CM | POA: Diagnosis not present

## 2024-07-16 DIAGNOSIS — M810 Age-related osteoporosis without current pathological fracture: Secondary | ICD-10-CM | POA: Diagnosis not present

## 2024-07-16 DIAGNOSIS — M1990 Unspecified osteoarthritis, unspecified site: Secondary | ICD-10-CM | POA: Diagnosis not present

## 2024-07-16 DIAGNOSIS — J8410 Pulmonary fibrosis, unspecified: Secondary | ICD-10-CM | POA: Diagnosis not present

## 2024-07-16 DIAGNOSIS — M059 Rheumatoid arthritis with rheumatoid factor, unspecified: Secondary | ICD-10-CM | POA: Diagnosis not present

## 2024-07-23 ENCOUNTER — Other Ambulatory Visit (HOSPITAL_COMMUNITY): Payer: Self-pay

## 2024-07-23 NOTE — Telephone Encounter (Signed)
 Per test claim copay for 30 day supply is $1673.46. Pt enrolled in Pulmonary Fibrosis grant through [employer]:  Amount: $5000 Award Period: 01/25/24 - 07/23/25 BIN: [id number] PCN: [id number] Group: [id number] ID: [id number]  For pharmacy inquiries, contact PDMI at [phone]. For patient inquiries, contact PAF at [phone].  Pt can continue to fill through PAP until the end of the year, in 2026 they will start filling through [facility] using grant.

## 2024-08-12 NOTE — Telephone Encounter (Signed)
 Referral placed to pharmacotherapy clinic  PA for Ofev  on file through 06/19/2025 assuming no insurance changes  Sherry Pennant, PharmD, MPH, BCPS, CPP Clinical Pharmacist

## 2024-08-14 ENCOUNTER — Ambulatory Visit: Attending: Pulmonary Disease

## 2024-08-14 DIAGNOSIS — J84112 Idiopathic pulmonary fibrosis: Secondary | ICD-10-CM

## 2024-08-14 NOTE — Telephone Encounter (Signed)
 When I called patient for Ofev  onboarding at [facility], he explained he self-stopped Ofev  due to side effects that are unmanageable (diarrhea). Felt he could not leave his home due to diarrhea, even when he tried using Imodium  as discussed at last OV with Dr. Theophilus.   Patient self-stopped Ofev  around the holidays. He states he planned to remain off Ofev  until his next OV with Dr. Theophilus. Per chart review, OV due April 2026.   Routing to Dr. Theophilus for awareness of above - patient not taking antifibrotic at this time.

## 2024-08-14 NOTE — Telephone Encounter (Signed)
 Of note, grant funds will be inaccessible to patient 180 days after enrollment if no activity

## 2024-08-14 NOTE — Progress Notes (Signed)
 [facility]  Referring Provider: Dr. Theophilus  Virtual Visit via Telephone Note  I connected with [name] on 08/14/2024 at 12:30 PM EST by telephone and verified that I am speaking with the correct person using two identifiers.  Location: Patient: home Provider: office   I discussed the limitations, risks, security and privacy concerns of performing an evaluation and management service by telephone and the availability of in person appointments. I also discussed with the patient that there may be a patient responsible charge related to this service. The patient expressed understanding and agreed to proceed.   HPI: [name] is a 79 y.o. male who presents to the pharmacotherapy clinic via telephone for Ofev  follow-up counseling. PMH includes pulmonary fibrosis secondary to rheumatoid arthritis. At last visit with Dr. Theophilus on 05/18/24, diarrhea was noted as a side effect of Ofev . At that time, he had not tried using OTC antidiarrheals. Plan to try Imodium  for diarrhea.   He was previously receiving Ofev  through patient assistance in 2025, but will be filled through [facility] and covered by a grant. Purpose of visit today is to discuss process for obtaining Ofev  this year.   Today, he brings forward the following concern:  - No longer taking Ofev . He stopped taking Ofev  around the holidays due to unmanageable diarrhea. He tried using Imodium  but this was not adequate. He feels he cannot leave his home due to diarrhea.   Patient Active Problem List   Diagnosis Date Noted   Macrocytic anemia 11/18/2023   Preoperative respiratory examination 11/07/2022   Polyclonal gammopathy 10/23/2022   Femoral hernia of left side 10/23/2022   Elevated PSA 10/23/2022   Hematochezia 03/26/2022   IPF (idiopathic pulmonary fibrosis) (HCC) 08/25/2021   Incarcerated ventral hernia 12/28/2020   Recurrent ventral incisional hernia 12/28/2020   Incarcerated incisional hernia with  SBO 12/28/2020   Cramp in lower leg associated with rest 11/01/2020   ED (erectile dysfunction) of organic origin 11/01/2020   Epididymal cyst 11/01/2020   Exacerbation of asthma 11/01/2020   Family history of ischemic heart disease (IHD) 11/01/2020   Family history of malignant neoplasm of gastrointestinal tract 11/01/2020   Gastroesophageal reflux disease without esophagitis 11/01/2020   Generalized anxiety disorder 11/01/2020   Hardening of the aorta (main artery of the heart) 11/01/2020   Hearing loss 11/01/2020   History of adenomatous polyp of colon 11/01/2020   History of hematuria 11/01/2020   Hyperlipidemia 11/01/2020   Long term (current) use of aspirin  11/01/2020   Osteoarthritis of knee 11/01/2020   Osteopenia 11/01/2020   Other skin changes due to chronic exposure to nonionizing radiation 11/01/2020   Parietoalveolar pneumopathy (HCC) 11/01/2020   Peripheral vascular disease 11/01/2020   Personal history of (healed) traumatic fracture 11/01/2020   Personal history of other malignant neoplasm of skin 11/01/2020   Stopped smoking 11/01/2020   Ventral hernia 11/01/2020   Body mass index (BMI) 30.0-30.9, adult 11/01/2020   SBO (small bowel obstruction) (HCC) 10/26/2020   Allergic rhinitis 07/12/2020   ILD (interstitial lung disease) (HCC) 07/12/2020   Healthcare maintenance 07/12/2020   DOE (dyspnea on exertion) 02/03/2020   Combined forms of age-related cataract of left eye 01/26/2020   Combined forms of age-related cataract of right eye 01/14/2020   Iliac artery occlusion, left (HCC) 01/13/2019   Right knee pain 03/02/2015    Patient's Medications  New Prescriptions   No medications on file  Previous Medications   ATORVASTATIN  (LIPITOR) 10 MG TABLET  Take 1 tablet (10 mg total) by mouth daily.   CALCIUM -PHOSPHORUS-VITAMIN D  (CITRACAL +D3 PO)    Take 1 tablet by mouth every evening.   CHOLECALCIFEROL 125 MCG (5000 UT) TABS    Take 1,000 Units by mouth every  evening.   CYANOCOBALAMIN  (VITAMIN B12 PO)    Take 1,000 mcg by mouth in the morning.   FERROUS SULFATE 325 (65 FE) MG TABLET    Take 325 mg by mouth every evening.   FOLIC ACID (FOLVITE) 1 MG TABLET    Take 1 mg by mouth every evening.   LORATADINE  (CLARITIN ) 10 MG TABLET    Take 1 tablet (10 mg total) by mouth daily.   METHOTREXATE (RHEUMATREX) 2.5 MG TABLET    Take 10 mg by mouth every Wednesday.   NINTEDANIB (OFEV ) 100 MG CAPS    Take 1 capsule (100 mg total) by mouth 2 (two) times daily.   OMEGA-3 FATTY ACIDS (OMEGA-3 FISH OIL PO)    Take 1 tablet by mouth in the morning, at noon, and at bedtime. Omega XL   OMEPRAZOLE (PRILOSEC) 10 MG CAPSULE    Take 10 mg by mouth daily as needed (indigestion/heartburn.).   PREDNISONE (DELTASONE) 5 MG TABLET    Take 5 mg by mouth daily with breakfast.   SERTRALINE  (ZOLOFT ) 100 MG TABLET    Take 100 mg by mouth every evening.  Modified Medications   No medications on file  Discontinued Medications   No medications on file    Allergies: Allergies[1]  Past Medical History: Past Medical History:  Diagnosis Date   Allergic rhinitis 07/12/2020   Basal cell carcinoma 02/21/2011   left inner cheek, lateral (CX35FU)   Body mass index (BMI) 30.0-30.9, adult 11/01/2020   Combined forms of age-related cataract of left eye 01/26/2020   Combined forms of age-related cataract of right eye 01/14/2020   Cramp in lower leg associated with rest 11/01/2020   Depression    DOE (dyspnea on exertion) 02/03/2020   Onset summer 2020   -  02/03/2020   Walked RA  3 laps @ approx 289ft each @   fast pace  stopped due to end of study,    sats 94% at end and minimally sob  - Collagen vascular profile 02/03/2020 >>>  Neg       Lab Results  Component  Value  Date     ESRSEDRATE  18  02/03/2020  - Allergy profile 02/03/20 >  Eos 0.4 /  IgE  1344   - HSP serology 02/03/2020  Neg   - HRCT 02/05/2020  compatible with inters   ED (erectile dysfunction) of organic origin 11/01/2020    Elevated PSA 10/23/2022   Epididymal cyst 11/01/2020   Exacerbation of asthma 11/01/2020   Family history of ischemic heart disease (IHD) 11/01/2020   Family history of malignant neoplasm of gastrointestinal tract 11/01/2020   Femoral hernia of left side 10/23/2022   Gastroesophageal reflux disease without esophagitis 11/01/2020   Generalized anxiety disorder 11/01/2020   GERD (gastroesophageal reflux disease)    Hardening of the aorta (main artery of the heart) 11/01/2020   Hearing loss 11/01/2020   Hematochezia 03/26/2022   History of adenomatous polyp of colon 11/01/2020   History of hematuria 11/01/2020   Hyperlipidemia    ILD (interstitial lung disease) (HCC) 07/12/2020   Iliac artery occlusion, left (HCC) 01/13/2019   Incarcerated incisional hernia with SBO 12/28/2020   Incarcerated ventral hernia 12/28/2020   IPF (idiopathic pulmonary fibrosis) (HCC)  08/25/2021   Long term (current) use of aspirin  11/01/2020   Osteoarthritis of knee 11/01/2020   Osteopenia    Other skin changes due to chronic exposure to nonionizing radiation 11/01/2020   Parietoalveolar pneumopathy (HCC) 11/01/2020   Peripheral vascular disease 11/01/2020   Personal history of (healed) traumatic fracture 11/01/2020   Personal history of other malignant neoplasm of skin 11/01/2020   Polyclonal gammopathy 10/23/2022   Pulmonary hypertension (HCC) 03/26/2022   Recurrent ventral incisional hernia 12/28/2020   Right knee pain 03/02/2015   SBO (small bowel obstruction) (HCC) 10/26/2020   Squamous cell carcinoma of skin 07/21/2019   in situ- fornt center scalp (CX35FU)   Squamous cell carcinoma, face 12/13/2018   in situ- left forehead(CX35FU)   Stopped smoking 11/01/2020   Ventral hernia 11/01/2020    Social History: Social History   Socioeconomic History   Marital status: Widowed    Spouse name: Jabier Deese   Number of children: 2   Years of education: 12th grade   Highest education level: Not  on file  Occupational History   Occupation: grading  Tobacco Use   Smoking status: Former    Current packs/day: 1.00    Average packs/day: 1 pack/day for 28.0 years (28.0 ttl pk-yrs)    Types: Cigarettes   Smokeless tobacco: Never   Tobacco comments:    quit 1992  Vaping Use   Vaping status: Never Used  Substance and Sexual Activity   Alcohol use: Yes    Alcohol/week: 6.0 standard drinks of alcohol    Types: 6 Cans of beer per week    Comment: beer occasional   Drug use: Never   Sexual activity: Not Currently  Other Topics Concern   Not on file  Social History Narrative   Not on file   Social Drivers of Health   Tobacco Use: Medium Risk (05/18/2024)   Patient History    Smoking Tobacco Use: Former    Smokeless Tobacco Use: Never    Passive Exposure: Not on Actuary Strain: Not on file  Food Insecurity: Not on file  Transportation Needs: Not on file  Physical Activity: Not on file  Stress: Not on file  Social Connections: Not on file  Depression (EYV7-0): Not on file  Alcohol Screen: Not on file  Housing: Not on file  Utilities: Not on file  Health Literacy: Not on file    Medication: Ofev  100mg  capsule twice daily   Patient endorses concerns with medication listed above - unmanageable side effects.  Plan: - Patient describes side effects of Ofev  unmanageable, and stopped taking Ofev  around the holidays. He intended to remain off medication until he sees Dr. Theophilus again - Next OV with Dr. Theophilus due in April 2026, not yet scheduled - PharmD to message Dr. Theophilus regarding above - defer to Dr. Jiles judgment on whether to initiate alternative medication - If alternative antifibrotic is started, he may still be covered by grant. Will re-assess at that time.  - Pharmacy team available if needed. - Follow-up with Dr. Theophilus.  I discussed the assessment and treatment plan with the patient. The patient was provided an opportunity to ask  questions and all were answered. The patient agreed with the plan and demonstrated an understanding of the instructions.   The patient was advised to call back or seek an in-person evaluation if the symptoms worsen or if the condition fails to improve as anticipated.  I provided 10 minutes of non-face-to-face time during this encounter.  Aleck Puls, PharmD, BCPS, CPP Clinical Pharmacist  [facility]  [facility]      [1] No Known Allergies

## 2024-08-17 NOTE — Telephone Encounter (Signed)
 Spoke with the pt and scheduled for 08/19/24 with Dr Theophilus.

## 2024-08-17 NOTE — Telephone Encounter (Signed)
 Thanks for the update.  [facility]- Can you make an appointment with me at next available time to discuss treatment options

## 2024-08-19 ENCOUNTER — Ambulatory Visit: Admitting: Pulmonary Disease

## 2024-08-19 ENCOUNTER — Encounter: Payer: Self-pay | Admitting: Pulmonary Disease

## 2024-08-19 VITALS — BP 117/73 | HR 63 | Temp 98.0°F | Ht 72.0 in | Wt 180.0 lb

## 2024-08-19 DIAGNOSIS — M069 Rheumatoid arthritis, unspecified: Secondary | ICD-10-CM

## 2024-08-19 DIAGNOSIS — J849 Interstitial pulmonary disease, unspecified: Secondary | ICD-10-CM

## 2024-08-19 DIAGNOSIS — Z5181 Encounter for therapeutic drug level monitoring: Secondary | ICD-10-CM

## 2024-08-19 DIAGNOSIS — R931 Abnormal findings on diagnostic imaging of heart and coronary circulation: Secondary | ICD-10-CM | POA: Diagnosis not present

## 2024-08-19 DIAGNOSIS — Z87891 Personal history of nicotine dependence: Secondary | ICD-10-CM | POA: Diagnosis not present

## 2024-08-19 DIAGNOSIS — R0600 Dyspnea, unspecified: Secondary | ICD-10-CM

## 2024-08-19 NOTE — Progress Notes (Signed)
 "              [name]    [id number]    10-07-1945  Primary Care Physician:Pharr, Ryan, MD  Referring Physician: Clarice Ryan, MD [address] [place],  [place] [address]  Chief complaint:  Follow-up for interstitial lung disease, pulmonary fibrosis Nintedanib started November 2022.  Lower dose due to side effects of diarrhea.  Discontinued 2025 Rheumatoid arthritis, on methotrexate. Discontinued 2025  HPI: 79 y.o.  former smoker referred to pulmonary for evaluation of interstitial lung disease States that he has mild dyspnea on exertion.  No cough. Referred for evaluation of progressive probable UIP pulmonary fibrosis.  Started Ofev  in November 2022.  Dose lowered to 100 mg twice daily due to side effects of diarrhea  Patient has a diagnosis of seropositive rheumatoid arthritis.  Previously on prednisone as well but discontinued in 2024 due to adverse side effects including anxiety.  Follows with Dr. Mikel, rheumatology at [facility]   Interim history: Discussed the use of AI scribe software for clinical note transcription with the patient, who gave verbal consent to proceed.  History of Present Illness [name] is an 78 year old male with interstitial lung disease secondary to rheumatoid arthritis who presents for follow-up regarding medication tolerance.  Dyspnea and functional status - Walks daily without noticeable change in dyspnea or walking tolerance - Concerned about possible slow worsening of lung function  Medication intolerance - Discontinued nintedanib due to severe diarrhea in 2025 requiring proximity to a bathroom; diarrhea resolved after stopping medication - Discontinued methotrexate for rheumatoid arthritis in 2025 and feels much better off it  Current medication use - Not taking any prescription medications for lung disease or rheumatoid arthritis  Immunization status and infection concerns - Has not  received a flu vaccine for 2 to 3 years - Concerned about risk of influenza infection   Relevant pulmonary history Pets: No pets Occupation: Works as a orthoptist with heavy machinery. Exposures: Exposed to dust.  No asbestos exposure.  No mold, hot tub, Jacuzzi.  No down pillows or comforters Smoking history: 20-pack-year smoker.  Quit in the 1990s Travel history: No significant travel history Relevant family history: No significant family history of lung disease  Outpatient Encounter Medications as of 08/19/2024  Medication Sig   Calcium -Phosphorus-Vitamin D  (CITRACAL +D3 PO) Take 1 tablet by mouth every evening.   Cholecalciferol 125 MCG (5000 UT) TABS Take 1,000 Units by mouth every evening.   Cyanocobalamin  (VITAMIN B12 PO) Take 1,000 mcg by mouth in the morning.   ferrous sulfate 325 (65 FE) MG tablet Take 325 mg by mouth every evening.   folic acid (FOLVITE) 1 MG tablet Take 1 mg by mouth every evening.   loratadine  (CLARITIN ) 10 MG tablet Take 1 tablet (10 mg total) by mouth daily.   methotrexate (RHEUMATREX) 2.5 MG tablet Take 10 mg by mouth every Wednesday.   Omega-3 Fatty Acids (OMEGA-3 FISH OIL PO) Take 1 tablet by mouth in the morning, at noon, and at bedtime. Omega XL   sertraline  (ZOLOFT ) 100 MG tablet Take 100 mg by mouth every evening.   atorvastatin  (LIPITOR) 10 MG tablet Take 1 tablet (10 mg total) by mouth daily. (Patient not taking: Reported on 08/19/2024)   Nintedanib (OFEV ) 100 MG CAPS Take 1 capsule (100 mg total) by mouth 2 (two) times daily. (Patient not taking: Reported on 08/19/2024)   omeprazole (PRILOSEC) 10 MG capsule Take 10 mg by  mouth daily as needed (indigestion/heartburn.). (Patient not taking: Reported on 08/19/2024)   predniSONE (DELTASONE) 5 MG tablet Take 5 mg by mouth daily with breakfast. (Patient not taking: Reported on 08/19/2024)   No facility-administered encounter medications on file as of 08/19/2024.   Vitals:   08/19/24 1030  BP: 117/73   Pulse: 63  Temp: 98 F (36.7 C)  Height: 6' (1.829 m)  Weight: 180 lb (81.6 kg)  SpO2: 95%  TempSrc: Oral  BMI (Calculated): 24.41     Physical Exam GEN: No acute distress CV: Regular rate and rhythm no murmurs LUNGS: Crackles present, otherwise clear to auscultation bilaterally with normal respiratory effort SKIN JOINTS: Warm and dry no rash    Data Reviewed: Imaging: High-res CT 02/05/2020-groundglass attenuation with reticulation and traction bronchiectasis with basal gradient.  2 to 4 mm pulmonary nodules.  Probable UIP pattern.  High-resolution CT 02/06/2021-stable pattern of pulmonary fibrosis and probable UIP pattern.  Stable pulm nodules.  High-res CT 02/20/2022-slight interval progression with honeycombing.  Probable UIP.  High-res CT 06/19/2023-mildly progressive fibrosis, UIP pattern.  High-res CT 03/17/2024-minimally progressive pulmonary fibrosis, UIP pattern I have reviewed the images personally.  PFTs: 04/11/2020 FVC 3.10 [69%], FEV1 2.56 [78%], F/F 83, TLC 4.75 [64%], DLCO 21.55 [81%] Moderate restriction  04/21/2021 FVC 2.68 [60%], FEV1 2.29 [71%], F/F 85, TLC 4.36 [59%], DLCO 17.29 [66%] Moderate restriction, severe diffusion defect.  Worsening lung volumes and diffusion defect  07/20/2022 FVC 2.35 [53%], FEV1 1.98 [62%], F/F84, TLC 3.76 [51%], DLCO 14.84 [57%] Severe restriction, moderate diffusion defect  09/13/2023 FVC 2.38 [54%), FEV1 2.00 [64%], F/F84, TLC 4.33 [58%], DLCO 12.75 [49%] Moderate restriction, moderate diffusion defect  Labs: IgE 02/02/2020-1344 Hypersensitivity panel 02/03/2020-negative ANA, CCP, rheumatoid factor 02/03/2020-negative  CTD serologies 04/11/2020-negative  Cardiology: Echocardiogram  03/13/2022-LVEF 60 to 65%, grade 1 diastolic dysfunction, reduced TAPSE 07/27/2022-LVEF 60 to 65%, grade 1 diastolic dysfunction, mildly elevated PA systolic pressure 10/17/2022-LVEF 65 to 70%, mild LVH, normal PA systolic pressure, normal RV  size and function.  Assessment & Plan Interstitial lung disease due to rheumatoid arthritis Interstitial lung disease likely secondary to rheumatoid arthritis with moderate restriction and reduced diffusion capacity on lung function tests. He has discontinued nintedanib due to severe diarrhea and methotrexate due to lack of efficacy and side effects. He prioritizes quality of life over aggressive treatment, acknowledging the risk of progressive lung scarring and potential loss of lung function. He is aware of the consequences of not treating the condition aggressively but prefers to focus on quality of life given his age. - Will order CT scan in August 2026 to assess lung condition. - Will order lung function test in August 2026. - Will discuss potential alternative medications with rheumatologist if symptoms worsen or if he decides to restart treatment after physical examination.  Concern for pulmonary hypertension Echo reviewed with reduced TAPSE in 2023.  I discussed with Dr. Bernie, cardiology.   Follow-up echocardiogram with no evidence of pulmonary hypertension.  Continue monitoring.    Plan/Recommendations: Follow up CT scan, PFTs  I personally spent a total of 42 minutes in the care of the patient today including preparing to see the patient, getting/reviewing separately obtained history, performing a medically appropriate exam/evaluation, counseling and educating, referring and communicating with other health care professionals, communicating results, and coordinating care.   Emmalyn Hinson MD [facility] 08/19/2024, 10:46 AM  CC: Clarice Nottingham, MD  "

## 2024-08-19 NOTE — Patient Instructions (Addendum)
" °  VISIT SUMMARY: You came in for a follow-up regarding your interstitial lung disease and medication tolerance. We discussed your current symptoms, medication history, and concerns about lung function and infection risk.  YOUR PLAN: INTERSTITIAL LUNG DISEASE DUE TO RHEUMATOID ARTHRITIS: Your lung disease is likely caused by your rheumatoid arthritis. You have moderate restriction and reduced lung function based on tests. You have stopped taking nintedanib and methotrexate due to side effects and perceived lack of efficacy. -We will order a CT scan in August 2026 to assess your lung condition. -We will also order a lung function test in August 2026. -We will discuss potential alternative medications with your rheumatologist if your symptoms worsen or if you decide to restart treatment.  IMMUNIZATION STATUS AND INFECTION CONCERNS: You have not received a flu vaccine in the past 2 to 3 years and are concerned about the risk of influenza infection. -Consider getting a flu vaccine to reduce your risk of influenza infection. "

## 2025-03-19 ENCOUNTER — Encounter
